# Patient Record
Sex: Female | Born: 1940 | Hispanic: No | Smoking: Never smoker
Health system: Southern US, Community
[De-identification: ages and names within clinical notes are randomized; demographics above are authoritative.]

## PROBLEM LIST (undated history)

## (undated) DIAGNOSIS — G4730 Sleep apnea, unspecified: Secondary | ICD-10-CM

## (undated) DIAGNOSIS — I10 Essential (primary) hypertension: Secondary | ICD-10-CM

## (undated) DIAGNOSIS — M1990 Unspecified osteoarthritis, unspecified site: Secondary | ICD-10-CM

## (undated) DIAGNOSIS — I4891 Unspecified atrial fibrillation: Secondary | ICD-10-CM

## (undated) DIAGNOSIS — I509 Heart failure, unspecified: Secondary | ICD-10-CM

## (undated) HISTORY — PX: NO PAST SURGERIES: SHX2092

---

## 2009-11-13 ENCOUNTER — Emergency Department (HOSPITAL_COMMUNITY): Admission: EM | Admit: 2009-11-13 | Discharge: 2009-11-13 | Payer: Self-pay | Admitting: Emergency Medicine

## 2011-01-23 LAB — DIFFERENTIAL
Basophils Absolute: 0 10*3/uL (ref 0.0–0.1)
Basophils Relative: 1 % (ref 0–1)
Eosinophils Absolute: 0.2 10*3/uL (ref 0.0–0.7)
Eosinophils Relative: 3 % (ref 0–5)
Neutro Abs: 3 10*3/uL (ref 1.7–7.7)

## 2011-01-23 LAB — CBC
HCT: 39.5 % (ref 36.0–46.0)
MCHC: 33.1 g/dL (ref 30.0–36.0)
MCV: 89.2 fL (ref 78.0–100.0)
Platelets: 231 10*3/uL (ref 150–400)
RBC: 4.43 MIL/uL (ref 3.87–5.11)
RDW: 12.9 % (ref 11.5–15.5)

## 2011-01-23 LAB — COMPREHENSIVE METABOLIC PANEL
ALT: 13 U/L (ref 0–35)
Alkaline Phosphatase: 75 U/L (ref 39–117)
CO2: 28 mEq/L (ref 19–32)
Calcium: 8.9 mg/dL (ref 8.4–10.5)
Creatinine, Ser: 0.51 mg/dL (ref 0.4–1.2)
GFR calc Af Amer: >60 mL/min (ref >60)
Sodium: 141 mEq/L (ref 135–145)
Total Bilirubin: 0.7 mg/dL (ref 0.3–1.2)
Total Protein: 7.1 g/dL (ref 6.0–8.3)

## 2011-01-23 LAB — URINALYSIS, ROUTINE W REFLEX MICROSCOPIC
Ketones, ur: NEGATIVE mg/dL
Urobilinogen, UA: 0.2 mg/dL (ref 0.0–1.0)
pH: 6 (ref 5.0–8.0)

## 2011-01-23 LAB — URINE MICROSCOPIC-ADD ON

## 2012-03-04 ENCOUNTER — Emergency Department (HOSPITAL_COMMUNITY): Payer: Medicare Other

## 2012-03-04 ENCOUNTER — Encounter (HOSPITAL_COMMUNITY): Payer: Self-pay | Admitting: General Practice

## 2012-03-04 ENCOUNTER — Emergency Department (HOSPITAL_COMMUNITY)
Admission: EM | Admit: 2012-03-04 | Discharge: 2012-03-04 | Disposition: A | Discharge status: Home / Self Care | Payer: Medicare Other | Attending: Emergency Medicine | Admitting: Emergency Medicine

## 2012-03-04 DIAGNOSIS — Z8739 Personal history of other diseases of the musculoskeletal system and connective tissue: Secondary | ICD-10-CM | POA: Insufficient documentation

## 2012-03-04 DIAGNOSIS — M25579 Pain in unspecified ankle and joints of unspecified foot: Secondary | ICD-10-CM | POA: Insufficient documentation

## 2012-03-04 DIAGNOSIS — M7020 Olecranon bursitis, unspecified elbow: Secondary | ICD-10-CM | POA: Insufficient documentation

## 2012-03-04 DIAGNOSIS — M25572 Pain in left ankle and joints of left foot: Secondary | ICD-10-CM

## 2012-03-04 HISTORY — DX: Unspecified osteoarthritis, unspecified site: M19.90

## 2012-03-04 NOTE — ED Notes (Signed)
Denies injury- good CMS

## 2012-03-04 NOTE — ED Notes (Signed)
Pt refused Ace wrap application but took ace wrap home with her.

## 2012-03-04 NOTE — ED Notes (Signed)
No distress noted.  Pt verbalizes understnading

## 2012-03-04 NOTE — ED Provider Notes (Signed)
Medical screening examination/treatment/procedure(s) were performed by non-physician practitioner and as supervising physician I was immediately available for consultation/collaboration.   Lyanne Co, MD 03/04/12 [phone]

## 2012-03-04 NOTE — ED Notes (Signed)
Pt states that she woke up this morning and her left elbow and left ankle/foot were hurting. Family states that the swelling has increased throughout the day. Pt stated she took 1 50mg  tablet of Tramadol for the pain with pain relief. Pt states she has had the swelling and pain in her left leg before. Pt denies history of blood clots. Pt denies pain anywhere else. Pt is in no acute distress at this time.

## 2012-03-04 NOTE — ED Provider Notes (Signed)
History     CSN: [id number]  Arrival date & time 03/04/12  2042   First MD Initiated Contact with Patient 03/04/12 2132      Chief Complaint  Patient presents with  . Arm Pain  . Leg Pain    (Consider location/radiation/quality/duration/timing/severity/associated sxs/prior treatment) HPI Comments: Pt is a 71yo female who presents with multiple complaints. States she wants her left ankle to get looked at. States left ankle is painful and swollen. Symptoms started about a week ago. Denies any injuries. Denies redness, fever. Also today, started having left elbow pain. Denies hitting elbow, denies injuring it. Denies swelling or redness to the elbow. She admits to having arthirits, denies any other medical problems. Denies taking any medications. Denies fever, chills, malaise.   The history is provided by the patient.    Past Medical History  Diagnosis Date  . Arthritis     No past surgical history on file.  No family history on file.  History  Substance Use Topics  . Smoking status: Never Smoker   . Smokeless tobacco: Never Used  . Alcohol Use: No    OB History    Grav Para Term Preterm Abortions TAB SAB Ect Mult Living                  Review of Systems  Constitutional: Negative for fever, chills and unexpected weight change.  Respiratory: Negative.   Cardiovascular: Negative.   Gastrointestinal: Negative.   Musculoskeletal: Positive for joint swelling and arthralgias.  Skin: Negative.   Neurological: Negative for dizziness and weakness.    Allergies  Shrimp  Home Medications  No current outpatient prescriptions on file.  BP 173/95  Pulse 69  Temp(Src) 98 F (36.7 C) (Oral)  Resp 17  SpO2 100%  Physical Exam  Nursing note and vitals reviewed. Constitutional: She is oriented to person, place, and time. She appears well-developed and well-nourished. No distress.  HENT:  Head: Normocephalic.  Eyes: Conjunctivae are normal.  Neck: Neck supple.    Cardiovascular: Normal rate, regular rhythm and normal heart sounds.   Pulmonary/Chest: Effort normal and breath sounds normal. No respiratory distress. She has no wheezes.  Musculoskeletal:       Swelling noted to the left lateral ankle. Tender to palpation over lateral malleolus. Pain with foot dorsilfexion, plantarflexion, inversion and eversion. Tenderness over olecranon of the left elbow. No medial or lateral epicondyle tenderness. Full ROM Of the let elbow. No erythema or swelling noted  Neurological: She is alert and oriented to person, place, and time.  Skin: Skin is warm and dry.  Psychiatric: She has a normal mood and affect.    ED Course  Procedures (including critical care time)  Pt with left elbow pain, no injury, left ankle pain, no injury. Pt requesting x-rays.   Dg Elbow Complete Left  03/04/2012  *RADIOLOGY REPORT*  Clinical Data: Left elbow pain.  LEFT ELBOW - COMPLETE 3+ VIEW  Comparison: None.  Findings: No fracture or dislocation.  No joint effusion.  No aggressive osseous lesion.  IMPRESSION: No acute osseous abnormality identified.  Original Report Authenticated By: Waneta Martins, M.D.   Dg Ankle Complete Left  03/04/2012  *RADIOLOGY REPORT*  Clinical Data: Left ankle pain, no known injury.  LEFT ANKLE COMPLETE - 3+ VIEW  Comparison: None.  Findings: There is soft tissue swelling overlying the ankle, most pronounced over the medial malleolus.  Small calcific density along the inferior tip of the medial malleolus may reflect remote trauma.  There is mild irregularity to the medial talus which is also favored to be sequelae of prior injury. Suboptimal lateral radiograph due to positioning.  There may be a small joint effusion. Degenerative changes of the tibiotalar and talonavicular joints.  IMPRESSION: Soft tissue swelling, sequelae of prior trauma, and degenerative changes.  No definite acute osseous abnormality. If there remanis clinical concern for a fracture,  recommend a repeat radiograph in 7- 10 days to evaluate for interval change or callus formation.  Original Report Authenticated By: Waneta Martins, M.D.   11:11 PM X-rays negative. Suspect possible non infectious olecranon bursitis and osteoarthritis of left ankle. There are no signs of joint infection. Pt reassured. She was given an ACE wrap for her elbow, and will d/c home. Pt will follow up with her doctor in [place].    1. Olecranon bursitis   2. Left ankle pain       MDM          Lottie Mussel, PA 03/04/12 2313

## 2012-03-04 NOTE — Discharge Instructions (Signed)
Your x-ray of the elbow is normal. Your x-ray of the the left ankle shows a lot of arthritis. Keep your leg elevated as much as able, Ice it after being on your feet for too long. ACE wrap to the elbow. Take ibuprofen 400mg  every 6 hrs for pain and swelling. You can take your tramadol or tylenol as well. Follow up with your doctor or a physician here.   RESOURCE GUIDE  Dental Problems  Patients with Medicaid: [facility] [phone] [address]                                           [phone] [address] Phone:  [phone]                                                  Phone:  [phone]  If unable to pay or uninsured, contact:  [facility] or [facility]. to become qualified for the adult dental clinic.  Chronic Pain Problems Contact [facility]  [phone] Patients need to be referred by their primary care doctor.  Insufficient Money for Medicine Contact [employer]:  call "211" or [facility] [phone].  No Primary Care Doctor Call [facility]  [phone] Other agencies that provide inexpensive medical care    [facility]  [phone]    [facility]  [phone]    [facility]  [phone]    [facility]  [phone]    [facility]  [phone]    [facility]  [phone]  Psychological Services [facility] Behavior[facility]  [phone] [facility]  [phone] [facility] Mental Health   [phone] (emergency services [phone])  Substance Abuse Resources [facility]  [phone] [facility] [phone] [facility] [phone] Floydene Flock [phone] Residential & Outpatient Substance Abuse Program  [phone]  Abuse/Neglect [facility] Child Abuse Hotline 725-189-8719 [facility] Child Abuse Hotline [phone] (After Hours)  Emergency Shelter [facility] 8322473275  Maternity Homes [facility] [phone] [facility] [phone]  MRSA Hotline #:   [phone]    [facility] Resources  [facility]     [employer]                          [facility] Dept. [address] [place]                       [address]      [address]  Patrecia Pace  [facility] Phone:  [phone]                                   Phone:  [phone]                 Phone:  [place] Phone:  [place] [phone] (After Hours)

## 2013-04-22 ENCOUNTER — Encounter (HOSPITAL_COMMUNITY): Payer: Self-pay | Admitting: *Deleted

## 2013-04-22 ENCOUNTER — Inpatient Hospital Stay (HOSPITAL_COMMUNITY)
Admission: EM | Admit: 2013-04-22 | Discharge: 2013-04-25 | DRG: 308 | Disposition: A | Discharge status: Home / Self Care | Payer: Medicare Other | Attending: Cardiology | Admitting: Cardiology

## 2013-04-22 ENCOUNTER — Emergency Department (HOSPITAL_COMMUNITY): Payer: Medicare Other

## 2013-04-22 DIAGNOSIS — M1990 Unspecified osteoarthritis, unspecified site: Secondary | ICD-10-CM | POA: Diagnosis present

## 2013-04-22 DIAGNOSIS — I10 Essential (primary) hypertension: Secondary | ICD-10-CM | POA: Diagnosis present

## 2013-04-22 DIAGNOSIS — I5041 Acute combined systolic (congestive) and diastolic (congestive) heart failure: Secondary | ICD-10-CM

## 2013-04-22 DIAGNOSIS — I4891 Unspecified atrial fibrillation: Principal | ICD-10-CM

## 2013-04-22 DIAGNOSIS — I509 Heart failure, unspecified: Secondary | ICD-10-CM | POA: Diagnosis present

## 2013-04-22 DIAGNOSIS — E876 Hypokalemia: Secondary | ICD-10-CM | POA: Diagnosis present

## 2013-04-22 HISTORY — DX: Essential (primary) hypertension: I10

## 2013-04-22 LAB — CBC
MCV: 90.2 fL (ref 78.0–100.0)
RBC: 4.5 MIL/uL (ref 3.87–5.11)
RDW: 13.2 % (ref 11.5–15.5)
WBC: 7.3 10*3/uL (ref 4.0–10.5)

## 2013-04-22 LAB — BASIC METABOLIC PANEL
BUN: 16 mg/dL (ref 6–23)
CO2: 23 mEq/L (ref 19–32)
Calcium: 8.8 mg/dL (ref 8.4–10.5)
Chloride: 108 mEq/L (ref 96–112)
Creatinine, Ser: 0.69 mg/dL (ref 0.50–1.10)
GFR calc Af Amer: >90 mL/min (ref >90)
GFR calc non Af Amer: 86 mL/min — ABNORMAL LOW (ref >90)
Glucose, Bld: 108 mg/dL — ABNORMAL HIGH (ref 70–99)
Potassium: 3.8 mEq/L (ref 3.5–5.1)

## 2013-04-22 LAB — PROTIME-INR: Prothrombin Time: 13.7 seconds (ref 11.6–15.2)

## 2013-04-22 MED ORDER — DEXTROSE 5 % IV SOLN: 60.0000 mg/h | Freq: Once | INTRAVENOUS | Status: DC

## 2013-04-22 MED ORDER — AMIODARONE HCL IN DEXTROSE 360-4.14 MG/200ML-% IV SOLN
60.0000 mg/h | INTRAVENOUS | Status: AC
  Administered 2013-04-23: 60 mg/h via INTRAVENOUS
  Filled 2013-04-22: qty 200

## 2013-04-22 MED ORDER — AMIODARONE HCL IN DEXTROSE 360-4.14 MG/200ML-% IV SOLN
30.0000 mg/h | INTRAVENOUS | Status: DC
  Administered 2013-04-23: 30 mg/h via INTRAVENOUS
  Filled 2013-04-22 (×3): qty 200

## 2013-04-22 MED ORDER — AMIODARONE IV BOLUS ONLY 150 MG/100ML
150.0000 mg | Freq: Once | INTRAVENOUS | Status: AC
  Administered 2013-04-23: 150 mg via INTRAVENOUS
  Filled 2013-04-22: qty 100

## 2013-04-22 MED ORDER — FUROSEMIDE 10 MG/ML IJ SOLN
40.0000 mg | Freq: Once | INTRAMUSCULAR | Status: AC
  Administered 2013-04-22: 40 mg via INTRAVENOUS
  Filled 2013-04-22: qty 4

## 2013-04-22 NOTE — ED Notes (Signed)
Pt states that she woke up around 200am feeling short of breath and that her heart was racing;  The symptoms have come and gone all day; pt states that she feels weak and tired since the symptoms began; NP cough; pt also c/o left lower rib area pain

## 2013-04-22 NOTE — ED Provider Notes (Signed)
History     CSN: [id number]  Arrival date & time 04/22/13  1939   First MD Initiated Contact with Patient 04/22/13 2053      Chief Complaint  Patient presents with  . Shortness of Breath  . Palpitations    (Consider location/radiation/quality/duration/timing/severity/associated sxs/prior treatment) HPI This 72 year old female moved here from [place] several months ago does not have a doctor here yet, she has no history of coronary artery disease or atrial fibrillation, she is no history of heart failure in the past, she states that she woke up at 2:00 this morning with palpitations with intermittent racing heartbeat and feeling of mild shortness of breath slight nonproductive cough and noticed edema to her lower legs and feet today, she has had no fever no chest pain no abdominal pain no lightheadedness no vomiting no bloody stools no focal neurologic symptoms and no treatment prior to arrival. Past Medical History  Diagnosis Date  . Arthritis   . Hypertension     History reviewed. No pertinent past surgical history.  No family history on file.  History  Substance Use Topics  . Smoking status: Never Smoker   . Smokeless tobacco: Never Used  . Alcohol Use: No    OB History   Grav Para Term Preterm Abortions TAB SAB Ect Mult Living                  Review of Systems 10 Systems reviewed and are negative for acute change except as noted in the HPI. Allergies  Shrimp  Home Medications   No current outpatient prescriptions on file.  BP 161/98  Pulse 87  Temp(Src) 97.6 F (36.4 C) (Oral)  Resp 20  Ht 5\' 5"  (1.651 m)  Wt 175 lb 7.8 oz (79.6 kg)  BMI 29.2 kg/m2  SpO2 99%  Physical Exam  Nursing note and vitals reviewed. Constitutional:  Awake, alert, nontoxic appearance.  HENT:  Head: Atraumatic.  Eyes: Right eye exhibits no discharge. Left eye exhibits no discharge.  Neck: Neck supple.  Cardiovascular:  No murmur heard. Irregularly irregular rate and  rhythm rate fluctuates approximately between 100-120bpm  Pulmonary/Chest: Effort normal. No respiratory distress. She has no wheezes. She has rales. She exhibits no tenderness.  Bibasilar crackles no wheezes no retractions no accessory muscle usage speech is normal with full sentences with pulse oximetry normal on RA at 97%  Abdominal: Soft. There is no tenderness. There is no rebound.  Musculoskeletal: She exhibits edema. She exhibits no tenderness.  Baseline ROM, no obvious new focal weakness. Mild edema to both lower legs without cellulitis.  Neurological:  Mental status and motor strength appears baseline for patient and situation.  Skin: No rash noted.  Psychiatric: She has a normal mood and affect.    ED Course  Procedures (including critical care time) ECG: Atrial fibrillation ventricular rate 103, normal axis, nonspecific T wave abnormality, no comparison ECG available   Pt feels improved after observation and/or treatment in ED. Patient voided 4 times in the ED and feels much improved. Patient initially wanted to leave AGAINST MEDICAL ADVICE but because she has no doctor no cardiologist has never had atrial fibrillation heart failure before the family and I have convinced her to stay in the hospital at least tonight which is now willing to do and cardiology is paged to consider admission. 2335  Harwani will see in ED for admit; recs heparin and amiodarone. 2340  Labs Reviewed  BASIC METABOLIC PANEL - Abnormal; Notable for the following:  Glucose, Bld 108 (*)    GFR calc non Af Amer 86 (*)    All other components within normal limits  PRO B NATRIURETIC PEPTIDE - Abnormal; Notable for the following:    Pro B Natriuretic peptide (BNP) 1148.0 (*)    All other components within normal limits  CBC  PROTIME-INR  HEPARIN LEVEL (UNFRACTIONATED)  CBC  TROPONIN I  TROPONIN I  TROPONIN I  PROTIME-INR  APTT  CBC WITH DIFFERENTIAL  TSH  COMPREHENSIVE METABOLIC PANEL  MAGNESIUM   PRO B NATRIURETIC PEPTIDE  HEMOGLOBIN A1C  LIPID PANEL  BASIC METABOLIC PANEL  POCT I-STAT TROPONIN I   Dg Chest 2 View  04/22/2013   *RADIOLOGY REPORT*  Clinical Data: Shortness of breath.  Palpitations.  CHEST - 2 VIEW  Comparison: Chest x-ray 11/13/2009.  Findings: There is cephalization of the pulmonary vasculature and slight indistinctness of the interstitial markings suggestive of mild pulmonary edema.  Mild cardiomegaly.  No acute consolidative airspace disease.  No pleural effusions.  Upper mediastinal contours are within normal limits.  Atherosclerosis in the thoracic aorta.  IMPRESSION: Mild cardiomegaly with mild interstitial pulmonary edema, suggestive of mild congestive heart failure.   Original Report Authenticated By: Trudie Reed, M.D.     1. Atrial fibrillation, new onset   2. Congestive heart failure, acute, combined       MDM  The patient appears reasonably stabilized for admission considering the current resources, flow, and capabilities available in the ED at this time, and I doubt any other [facility] requiring further screening and/or treatment in the ED prior to admission.        Hurman Horn, MD 04/23/13 [phone]

## 2013-04-22 NOTE — H&P (Signed)
Chelsea [name] is an 72 y.o. female.   Chief Complaint: Palpitation associated with shortness of breath HPI: Patient is 72 year old female with past medical history significant for hypertension not on any medication, history of arthritis, came to the ER complaining of amputation of and on since yesterday 2 AM associated with mild shortness of breath and leg swelling associated with feeling tired weak and fatigued. States she went to her daughter's house and she brought her to 67. Patient denies any chest pain nausea or vomiting diaphoresis. Denies prior history of palpitations. Denies any lightheadedness or syncope. Denies history of PND orthopnea. Patient states she walks 20 minutes daily without any problems. Denies any recent weight loss or gain. In ED patient was found to be in A. fib with moderate ventricular response and mild decompensated heart failure. Patient denies any history of rheumatic fever in the past.  Past Medical History  Diagnosis Date  . Arthritis   . Hypertension     History reviewed. No pertinent past surgical history.  No family history on file. Social History:  reports that she has never smoked. She has never used smokeless tobacco. She reports that she does not drink alcohol or use illicit drugs.  Allergies:  Allergies  Allergen Reactions  . Shrimp (Shellfish Allergy)     hives     (Not in a hospital admission)  Results for orders placed during the hospital encounter of 04/22/13 (from the past 48 hour(s))  CBC     Status: None   Collection Time    04/22/13  9:10 PM      Result Value Range   WBC 7.3  4.0 - 10.5 K/uL   RBC 4.50  3.87 - 5.11 MIL/uL   Hemoglobin 12.7  12.0 - 15.0 g/dL   HCT 96.0  45.4 - 09.8 %   MCV 90.2  78.0 - 100.0 fL   MCH 28.2  26.0 - 34.0 pg   MCHC 31.3  30.0 - 36.0 g/dL   RDW 11.9  14.7 - 82.9 %   Platelets 211  150 - 400 K/uL  BASIC METABOLIC PANEL     Status: Abnormal   Collection Time    04/22/13  9:10 PM      Result Value Range    Sodium 141  135 - 145 mEq/L   Potassium 3.8  3.5 - 5.1 mEq/L   Chloride 108  96 - 112 mEq/L   CO2 23  19 - 32 mEq/L   Glucose, Bld 108 (*) 70 - 99 mg/dL   BUN 16  6 - 23 mg/dL   Creatinine, Ser 5.62  0.50 - 1.10 mg/dL   Calcium 8.8  8.4 - 13.0 mg/dL   GFR calc non Af Amer 86 (*) >90 mL/min   GFR calc Af Amer >90  >90 mL/min   Comment:            The eGFR has been calculated     using the CKD EPI equation.     This calculation has not been     validated in all clinical     situations.     eGFR's persistently     <90 mL/min signify     possible Chronic Kidney Disease.  PRO B NATRIURETIC PEPTIDE     Status: Abnormal   Collection Time    04/22/13  9:10 PM      Result Value Range   Pro B Natriuretic peptide (BNP) 1148.0 (*) 0 - 125 pg/mL  PROTIME-INR  Status: None   Collection Time    04/22/13  9:10 PM      Result Value Range   Prothrombin Time 13.7  11.6 - 15.2 seconds   INR 1.06  0.00 - 1.49  POCT I-STAT TROPONIN I     Status: None   Collection Time    04/22/13  9:16 PM      Result Value Range   Troponin i, poc 0.01  0.00 - 0.08 ng/mL   Comment 3            Comment: Due to the release kinetics of cTnI,     a negative result within the first hours     of the onset of symptoms does not rule out     myocardial infarction with certainty.     If myocardial infarction is still suspected,     repeat the test at appropriate intervals.   Dg Chest 2 View  04/22/2013   *RADIOLOGY REPORT*  Clinical Data: Shortness of breath.  Palpitations.  CHEST - 2 VIEW  Comparison: Chest x-ray 11/13/2009.  Findings: There is cephalization of the pulmonary vasculature and slight indistinctness of the interstitial markings suggestive of mild pulmonary edema.  Mild cardiomegaly.  No acute consolidative airspace disease.  No pleural effusions.  Upper mediastinal contours are within normal limits.  Atherosclerosis in the thoracic aorta.  IMPRESSION: Mild cardiomegaly with mild interstitial pulmonary  edema, suggestive of mild congestive heart failure.   Original Report Authenticated By: Trudie Reed, M.D.    Review of Systems  Constitutional: Negative for fever, chills, weight loss and diaphoresis.  HENT: Negative for hearing loss.   Eyes: Negative for blurred vision and double vision.  Respiratory: Positive for shortness of breath. Negative for cough, hemoptysis and sputum production.   Cardiovascular: Positive for palpitations and leg swelling. Negative for chest pain, orthopnea and claudication.  Gastrointestinal: Negative for nausea and vomiting.  Neurological: Positive for weakness. Negative for dizziness and headaches.    Blood pressure 135/83, pulse 77, temperature 98.4 F (36.9 C), temperature source Oral, resp. rate 23, height 5\' 5"  (1.651 m), weight 83.462 kg (184 lb), SpO2 99.00%. Physical Exam  Constitutional: She is oriented to person, place, and time. She appears well-developed and well-nourished.  HENT:  Head: Normocephalic and atraumatic.  Eyes: Conjunctivae and EOM are normal. Pupils are equal, round, and reactive to light.  Neck: Normal range of motion. Neck supple. JVD present. No thyromegaly present.  Cardiovascular:  Irregularly irregular S1-S2 soft soft systolic murmur and S3 gallop noted   Respiratory:  Decreased breath sound at bases with bibasilar rales  GI: Soft. She exhibits no distension. There is no tenderness. There is no rebound and no guarding.  Musculoskeletal:  No clubbing cyanosis 1+ edema noted more in the right leg as compared to left leg  Neurological: She is alert and oriented to person, place, and time.     Assessment/Plan New-onset A. 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

## 2013-04-23 LAB — COMPREHENSIVE METABOLIC PANEL
ALT: 36 U/L — ABNORMAL HIGH (ref 0–35)
Albumin: 3.4 g/dL — ABNORMAL LOW (ref 3.5–5.2)
Alkaline Phosphatase: 65 U/L (ref 39–117)
BUN: 13 mg/dL (ref 6–23)
Chloride: 105 mEq/L (ref 96–112)
GFR calc Af Amer: >90 mL/min (ref >90)
Glucose, Bld: 114 mg/dL — ABNORMAL HIGH (ref 70–99)
Potassium: 3 mEq/L — ABNORMAL LOW (ref 3.5–5.1)
Sodium: 142 mEq/L (ref 135–145)
Total Bilirubin: 0.3 mg/dL (ref 0.3–1.2)

## 2013-04-23 LAB — CBC WITH DIFFERENTIAL/PLATELET
HCT: 40 % (ref 36.0–46.0)
Hemoglobin: 12.7 g/dL (ref 12.0–15.0)
Lymphocytes Relative: 36 % (ref 12–46)
Monocytes Absolute: 0.6 10*3/uL (ref 0.1–1.0)
Monocytes Relative: 8 % (ref 3–12)
Neutro Abs: 3.8 10*3/uL (ref 1.7–7.7)
RBC: 4.43 MIL/uL (ref 3.87–5.11)
WBC: 7 10*3/uL (ref 4.0–10.5)

## 2013-04-23 LAB — LIPID PANEL
Cholesterol: 199 mg/dL (ref 0–200)
HDL: 50 mg/dL (ref >39)
Total CHOL/HDL Ratio: 4 RATIO
VLDL: 17 mg/dL (ref 0–40)

## 2013-04-23 LAB — MAGNESIUM: Magnesium: 2.1 mg/dL (ref 1.5–2.5)

## 2013-04-23 LAB — APTT: aPTT: 69 seconds — ABNORMAL HIGH (ref 24–37)

## 2013-04-23 MED ORDER — NITROGLYCERIN 0.4 MG SL SUBL: 0.4000 mg | SUBLINGUAL_TABLET | SUBLINGUAL | Status: DC | PRN

## 2013-04-23 MED ORDER — SODIUM CHLORIDE 0.9 % IV SOLN: INTRAVENOUS | Status: DC

## 2013-04-23 MED ORDER — ACETAMINOPHEN 325 MG PO TABS
650.0000 mg | ORAL_TABLET | ORAL | Status: DC | PRN
  Administered 2013-04-24: 650 mg via ORAL
  Filled 2013-04-23: qty 2

## 2013-04-23 MED ORDER — ASPIRIN 300 MG RE SUPP
300.0000 mg | RECTAL | Status: AC
  Filled 2013-04-23: qty 1

## 2013-04-23 MED ORDER — AMIODARONE HCL 200 MG PO TABS
400.0000 mg | ORAL_TABLET | Freq: Two times a day (BID) | ORAL | Status: DC
  Administered 2013-04-23 – 2013-04-24 (×3): 400 mg via ORAL
  Filled 2013-04-23 (×4): qty 2

## 2013-04-23 MED ORDER — HEPARIN BOLUS VIA INFUSION
3000.0000 [IU] | Freq: Once | INTRAVENOUS | Status: AC
  Administered 2013-04-23: 3000 [IU] via INTRAVENOUS

## 2013-04-23 MED ORDER — ONDANSETRON HCL 4 MG/2ML IJ SOLN: 4.0000 mg | Freq: Four times a day (QID) | INTRAMUSCULAR | Status: DC | PRN

## 2013-04-23 MED ORDER — POTASSIUM CHLORIDE CRYS ER 20 MEQ PO TBCR
40.0000 meq | EXTENDED_RELEASE_TABLET | Freq: Once | ORAL | Status: AC
  Administered 2013-04-23: 40 meq via ORAL
  Filled 2013-04-23: qty 2

## 2013-04-23 MED ORDER — HEPARIN (PORCINE) IN NACL 100-0.45 UNIT/ML-% IJ SOLN
1000.0000 [IU]/h | INTRAMUSCULAR | Status: DC
  Administered 2013-04-23 – 2013-04-24 (×3): 1000 [IU]/h via INTRAVENOUS
  Filled 2013-04-23 (×4): qty 250

## 2013-04-23 MED ORDER — POTASSIUM CHLORIDE CRYS ER 20 MEQ PO TBCR
20.0000 meq | EXTENDED_RELEASE_TABLET | Freq: Two times a day (BID) | ORAL | Status: DC
  Administered 2013-04-23 – 2013-04-25 (×5): 20 meq via ORAL
  Filled 2013-04-23 (×6): qty 1

## 2013-04-23 MED ORDER — FUROSEMIDE 10 MG/ML IJ SOLN
40.0000 mg | Freq: Every day | INTRAMUSCULAR | Status: DC
  Administered 2013-04-23 – 2013-04-25 (×3): 40 mg via INTRAVENOUS
  Filled 2013-04-23 (×3): qty 4

## 2013-04-23 MED ORDER — ASPIRIN EC 81 MG PO TBEC
81.0000 mg | DELAYED_RELEASE_TABLET | Freq: Every day | ORAL | Status: DC
  Administered 2013-04-24 – 2013-04-25 (×2): 81 mg via ORAL
  Filled 2013-04-23 (×2): qty 1

## 2013-04-23 MED ORDER — POTASSIUM CHLORIDE CRYS ER 20 MEQ PO TBCR
20.0000 meq | EXTENDED_RELEASE_TABLET | Freq: Every day | ORAL | Status: DC
  Filled 2013-04-23: qty 1

## 2013-04-23 MED ORDER — ASPIRIN 81 MG PO CHEW
324.0000 mg | CHEWABLE_TABLET | ORAL | Status: AC
  Filled 2013-04-23: qty 4

## 2013-04-23 MED ORDER — VITAMIN D (ERGOCALCIFEROL) 1.25 MG (50000 UNIT) PO CAPS: 50000.0000 [IU] | ORAL_CAPSULE | ORAL | Status: DC

## 2013-04-23 MED ORDER — METOPROLOL TARTRATE 12.5 MG HALF TABLET
12.5000 mg | ORAL_TABLET | Freq: Two times a day (BID) | ORAL | Status: DC
  Administered 2013-04-23 – 2013-04-24 (×3): 12.5 mg via ORAL
  Filled 2013-04-23 (×5): qty 1

## 2013-04-23 NOTE — Progress Notes (Signed)
ANTICOAGULATION CONSULT NOTE - Initial Consult  Pharmacy Consult for Heparin Indication: atrial fibrillation  Allergies  Allergen Reactions  . Shrimp (Shellfish Allergy)     hives    Patient Measurements: Height: 5\' 5"  (165.1 cm) Weight: 184 lb (83.462 kg) IBW/kg (Calculated) : 57 Heparin Dosing Weight:   Vital Signs: Temp: 98.4 F (36.9 C) (06/16 2035) Temp src: Oral (06/16 2035) BP: 135/83 mmHg (06/16 2200) Pulse Rate: 77 (06/16 2200)  Labs:  Recent Labs  04/22/13 2110  HGB 12.7  HCT 40.6  PLT 211  LABPROT 13.7  INR 1.06  CREATININE 0.69    Estimated Creatinine Clearance: 69.8 ml/min (by C-G formula based on Cr of 0.69).   Medical History: Past Medical History  Diagnosis Date  . Arthritis   . Hypertension     Medications:  Infusions:  . amiodarone (NEXTERONE PREMIX) 360 mg/200 mL dextrose 60 mg/hr (04/23/13 0004)  . amiodarone (NEXTERONE PREMIX) 360 mg/200 mL dextrose    . heparin 1,000 Units/hr (04/23/13 0036)    Assessment: Patient with afib.   Goal of Therapy:  Heparin level 0.3-0.7 units/ml Monitor platelets by anticoagulation protocol: Yes   Plan:  Heparin bolus 3000 units iv x1 Heparin drip 1000 units/hr Heparin level and cbc daily, next level at 0800  Darlina Guys, [place] 04/23/2013,1:42 AM

## 2013-04-23 NOTE — Progress Notes (Signed)
  Echocardiogram 2D Echocardiogram has been performed.  Chelsea [name] 04/23/2013, 10:31 AM 

## 2013-04-23 NOTE — Progress Notes (Signed)
ANTICOAGULATION CONSULT NOTE - Follow Up Consult  Pharmacy Consult for IV heparin Indication: atrial fibrillation  Allergies  Allergen Reactions  . Shrimp (Shellfish Allergy)     hives    Patient Measurements: Height: 5\' 5"  (165.1 cm) Weight: 175 lb 7.8 oz (79.6 kg) IBW/kg (Calculated) : 57  Vital Signs: Temp: 97.5 F (36.4 C) (06/17 0526) Temp src: Oral (06/17 0526) BP: 147/97 mmHg (06/17 0526) Pulse Rate: 91 (06/17 0526)  Labs:  Recent Labs  04/22/13 2110 04/23/13 0445 04/23/13 0810  HGB 12.7 12.7  --   HCT 40.6 40.0  --   PLT 211 201  --   APTT  --  69*  --   LABPROT 13.7 14.2  --   INR 1.06 1.11  --   HEPARINUNFRC  --   --  0.40  CREATININE 0.69 0.65  --   TROPONINI  --  <0.30 <0.30    Estimated Creatinine Clearance: 68.2 ml/min (by C-G formula based on Cr of 0.65).   Assessment: 71 yof presented 6/16 with SOB and palpitations - found with new onset atrial fibrillation and mild CHF. Heparin ordered, CHADS2 score = 2 (new CHF, h/o HTN). CHA2DS2-VaSc score = 4.   IV heparin currently running at 1000 units/hr and first heparin level therapeutic at 0.40. CBC wnl, no bleeding.  Pt continues on amiodarone gtt.  Cardiology on board.  Goal of Therapy:  Heparin level 0.3-0.7 units/ml Monitor platelets by anticoagulation protocol: Yes   Plan:   Continue IV heparin at 1000 units/hr  Confirmatory heparin level at 1600  Daily CBC and heparin level  Pharmacy will f/u  Geoffry Paradise, PharmD, BCPS Pager: [phone] 10:20 AM Pharmacy #: [phone]

## 2013-04-23 NOTE — Progress Notes (Signed)
Subjective:  Patient denies any chest pain states breathing is improved remains in atrial fibrillation  Objective:  Vital Signs in the last 24 hours: Temp:  [97.5 F (36.4 C)-98.4 F (36.9 C)] 97.5 F (36.4 C) (06/17 0526) Pulse Rate:  [70-106] 91 (06/17 0526) Resp:  [18-23] 20 (06/17 0526) BP: (135-161)/(83-103) 147/97 mmHg (06/17 0526) SpO2:  [97 %-100 %] 99 % (06/17 0526) Weight:  [79.6 kg (175 lb 7.8 oz)-83.462 kg (184 lb)] 79.6 kg (175 lb 7.8 oz) (06/17 0314)  Intake/Output from previous day: 06/16 0701 - 06/17 0700 In: -  Out: 150 [Urine:150] Intake/Output from this shift: Total I/O In: -  Out: 400 [Urine:400]  Physical Exam: Neck: no adenopathy, no carotid bruit, no JVD and supple, symmetrical, trachea midline Lungs: Decreased breath sound at bases with bibasilar rales Heart: irregularly irregular rhythm, S1, S2 normal and Soft systolic murmur and S3 gallop noted Abdomen: soft, non-tender; bowel sounds normal; no masses,  no organomegaly Extremities: extremities normal, atraumatic, no cyanosis or edema  Lab Results:  Recent Labs  04/22/13 2110 04/23/13 0445  WBC 7.3 7.0  HGB 12.7 12.7  PLT 211 201    Recent Labs  04/22/13 2110 04/23/13 0445  NA 141 142  K 3.8 3.0*  CL 108 105  CO2 23 27  GLUCOSE 108* 114*  BUN 16 13  CREATININE 0.69 0.65    Recent Labs  04/23/13 0445 04/23/13 0810  TROPONINI <0.30 <0.30   Hepatic Function Panel  Recent Labs  04/23/13 0445  PROT 6.5  ALBUMIN 3.4*  AST 26  ALT 36*  ALKPHOS 65  BILITOT 0.3    Recent Labs  04/23/13 0445  CHOL 199   No results found for this basename: PROTIME,  in the last 72 hours  Imaging: Imaging results have been reviewed and Dg Chest 2 View  04/22/2013   *RADIOLOGY REPORT*  Clinical Data: Shortness of breath.  Palpitations.  CHEST - 2 VIEW  Comparison: Chest x-ray 11/13/2009.  Findings: There is cephalization of the pulmonary vasculature and slight indistinctness of the  interstitial markings suggestive of mild pulmonary edema.  Mild cardiomegaly.  No acute consolidative airspace disease.  No pleural effusions.  Upper mediastinal contours are within normal limits.  Atherosclerosis in the thoracic aorta.  IMPRESSION: Mild cardiomegaly with mild interstitial pulmonary edema, suggestive of mild congestive heart failure.   Original Report Authenticated By: Trudie Reed, M.D.    Cardiac Studies:  Assessment/Plan:  New-onset A. 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 Change IV amiodarone to by mouth  LOS: 1 day    Chelsea [name] N 04/23/2013, 12:48 PM

## 2013-04-23 NOTE — Progress Notes (Signed)
ANTICOAGULATION CONSULT NOTE - Follow Up Consult  Pharmacy Consult for IV heparin Indication: atrial fibrillation  Allergies  Allergen Reactions  . Shrimp (Shellfish Allergy)     hives    Patient Measurements: Height: 5\' 5"  (165.1 cm) Weight: 175 lb 7.8 oz (79.6 kg) IBW/kg (Calculated) : 57  Vital Signs: Temp: 97.9 F (36.6 C) (06/17 1426) Temp src: Oral (06/17 1426) BP: 138/80 mmHg (06/17 1426) Pulse Rate: 93 (06/17 1426)  Labs:  Recent Labs  04/22/13 2110 04/23/13 0445 04/23/13 0810 04/23/13 1547  HGB 12.7 12.7  --   --   HCT 40.6 40.0  --   --   PLT 211 201  --   --   APTT  --  69*  --   --   LABPROT 13.7 14.2  --   --   INR 1.06 1.11  --   --   HEPARINUNFRC  --   --  0.40 0.37  CREATININE 0.69 0.65  --   --   TROPONINI  --  <0.30 <0.30 <0.30    Estimated Creatinine Clearance: 68.2 ml/min (by C-G formula based on Cr of 0.65).   Assessment: 47 yof presented 6/16 with SOB and palpitations - found with new onset atrial fibrillation and mild CHF. Heparin ordered, CHADS2 score = 2 (new CHF, h/o HTN). CHA2DS2-VaSc score = 4.   IV heparin currently running at 1000 units/hr and first two heparin level are therapeutic.   CBC wnl, no bleeding reported.    Goal of Therapy:  Heparin level 0.3-0.7 units/ml Monitor platelets by anticoagulation protocol: Yes   Plan:   Continue IV heparin at 1000 units/hr  Daily CBC and heparin level  Pharmacy will f/u  Loralee Pacas, PharmD, BCPS Pharmacy #: [phone]

## 2013-04-24 LAB — BASIC METABOLIC PANEL
BUN: 18 mg/dL (ref 6–23)
Calcium: 9.1 mg/dL (ref 8.4–10.5)
Creatinine, Ser: 0.78 mg/dL (ref 0.50–1.10)
GFR calc Af Amer: >90 mL/min (ref >90)
GFR calc non Af Amer: 83 mL/min — ABNORMAL LOW (ref >90)
Glucose, Bld: 109 mg/dL — ABNORMAL HIGH (ref 70–99)
Potassium: 4 mEq/L (ref 3.5–5.1)

## 2013-04-24 LAB — CBC
HCT: 42.5 % (ref 36.0–46.0)
Hemoglobin: 13.2 g/dL (ref 12.0–15.0)
MCH: 28.3 pg (ref 26.0–34.0)
MCHC: 31.1 g/dL (ref 30.0–36.0)
RDW: 13.4 % (ref 11.5–15.5)

## 2013-04-24 LAB — HEPARIN LEVEL (UNFRACTIONATED): Heparin Unfractionated: 0.52 IU/mL (ref 0.30–0.70)

## 2013-04-24 MED ORDER — METOPROLOL TARTRATE 25 MG PO TABS
25.0000 mg | ORAL_TABLET | Freq: Two times a day (BID) | ORAL | Status: DC
  Administered 2013-04-24 – 2013-04-25 (×2): 25 mg via ORAL
  Filled 2013-04-24 (×3): qty 1

## 2013-04-24 MED ORDER — AMIODARONE HCL 200 MG PO TABS
200.0000 mg | ORAL_TABLET | Freq: Two times a day (BID) | ORAL | Status: DC
  Administered 2013-04-24 – 2013-04-25 (×2): 200 mg via ORAL
  Filled 2013-04-24 (×3): qty 1

## 2013-04-24 MED ORDER — APIXABAN 5 MG PO TABS
5.0000 mg | ORAL_TABLET | Freq: Two times a day (BID) | ORAL | Status: DC
  Administered 2013-04-24 – 2013-04-25 (×2): 5 mg via ORAL
  Filled 2013-04-24 (×3): qty 1

## 2013-04-24 NOTE — Progress Notes (Addendum)
ANTICOAGULATION CONSULT NOTE - Follow Up Consult  Pharmacy Consult for IV heparin Indication: atrial fibrillation  Allergies  Allergen Reactions  . Shrimp (Shellfish Allergy)     hives    Patient Measurements: Height: 5\' 5"  (165.1 cm) Weight: 176 lb 12.9 oz (80.2 kg) IBW/kg (Calculated) : 57  Vital Signs: Temp: 97.8 F (36.6 C) (06/18 0540) Temp src: Oral (06/18 0540) BP: 127/77 mmHg (06/18 0540) Pulse Rate: 85 (06/18 0540)  Labs:  Recent Labs  04/22/13 2110 04/23/13 0445 04/23/13 0810 04/23/13 1547 04/24/13 0519  HGB 12.7 12.7  --   --  13.2  HCT 40.6 40.0  --   --  42.5  PLT 211 201  --   --  194  APTT  --  69*  --   --   --   LABPROT 13.7 14.2  --   --   --   INR 1.06 1.11  --   --   --   HEPARINUNFRC  --   --  0.40 0.37 0.52  CREATININE 0.69 0.65  --   --  0.78  TROPONINI  --  <0.30 <0.30 <0.30  --     Estimated Creatinine Clearance: 68.5 ml/min (by C-G formula based on Cr of 0.78).   Assessment: 42 yof presented 6/16 with SOB and palpitations - found with new onset atrial fibrillation and mild CHF. Heparin ordered, CHADS2 score = 2 (new CHF, h/o HTN). CHA2DS2-VaSc score = 4.   IV heparin currently running at 1000 units/hr and heparin levels has been therapeutic. CBC wnl, no bleeding.  Pt continues on amiodarone (now PO).   Goal of Therapy:  Heparin level 0.3-0.7 units/ml Monitor platelets by anticoagulation protocol: Yes   Plan:   Continue IV heparin at 1000 units/hr  Confirmatory heparin level at 1600  Daily CBC and heparin level  MD, what is the plan for long-term anticoagulation in this patient?  Geoffry Paradise, PharmD, BCPS Pager: [phone] 10:34 AM Pharmacy #: [phone]

## 2013-04-24 NOTE — Progress Notes (Signed)
Subjective:  Patient denies any chest pain or shortness of breath states feels much better leg swelling is improved eager to go home. Remains in atrial fib with moderate ventricular response  Objective:  Vital Signs in the last 24 hours: Temp:  [97.8 F (36.6 C)-98.2 F (36.8 C)] 97.9 F (36.6 C) (06/18 1502) Pulse Rate:  [80-99] 95 (06/18 1502) Resp:  [19-20] 20 (06/18 1502) BP: (119-127)/(72-89) 121/72 mmHg (06/18 1502) SpO2:  [97 %-99 %] 98 % (06/18 1502) Weight:  [80.2 kg (176 lb 12.9 oz)] 80.2 kg (176 lb 12.9 oz) (06/18 0540)  Intake/Output from previous day: 06/17 0701 - 06/18 0700 In: 1128 [P.O.:920; I.V.:208] Out: 2050 [Urine:2050] Intake/Output from this shift: Total I/O In: 450 [P.O.:360; I.V.:90] Out: 500 [Urine:500]  Physical Exam: Neck: no adenopathy, no carotid bruit, no JVD and supple, symmetrical, trachea midline Lungs: Decreased breath sound at bases with bibasilar rales air entry improved Heart: irregularly irregular rhythm, S1, S2 normal and Soft systolic murmur noted Abdomen: soft, non-tender; bowel sounds normal; no masses,  no organomegaly Extremities: extremities normal, atraumatic, no cyanosis or edema  Lab Results:  Recent Labs  04/23/13 0445 04/24/13 0519  WBC 7.0 5.8  HGB 12.7 13.2  PLT 201 194    Recent Labs  04/23/13 0445 04/24/13 0519  NA 142 141  K 3.0* 4.0  CL 105 103  CO2 27 28  GLUCOSE 114* 109*  BUN 13 18  CREATININE 0.65 0.78    Recent Labs  04/23/13 0810 04/23/13 1547  TROPONINI <0.30 <0.30   Hepatic Function Panel  Recent Labs  04/23/13 0445  PROT 6.5  ALBUMIN 3.4*  AST 26  ALT 36*  ALKPHOS 65  BILITOT 0.3    Recent Labs  04/23/13 0445  CHOL 199   No results found for this basename: PROTIME,  in the last 72 hours  Imaging: Imaging results have been reviewed and Dg Chest 2 View  04/22/2013   *RADIOLOGY REPORT*  Clinical Data: Shortness of breath.  Palpitations.  CHEST - 2 VIEW  Comparison: Chest  x-ray 11/13/2009.  Findings: There is cephalization of the pulmonary vasculature and slight indistinctness of the interstitial markings suggestive of mild pulmonary edema.  Mild cardiomegaly.  No acute consolidative airspace disease.  No pleural effusions.  Upper mediastinal contours are within normal limits.  Atherosclerosis in the thoracic aorta.  IMPRESSION: Mild cardiomegaly with mild interstitial pulmonary edema, suggestive of mild congestive heart failure.   Original Report Authenticated By: Trudie Reed, M.D.    Cardiac Studies:  Assessment/Plan:  New-onset A. 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

## 2013-04-24 NOTE — Progress Notes (Signed)
MEDICATION RELATED CONSULT NOTE - INITIAL   Pharmacy Consult for apixaban Indication: non-valvular Afib  A/P: Patient possesses none of criteria for reducing apixaban dosing (must possess 2 of the following: age>=80, weight <= 60kg, or SCr>=1.5). Start apixaban 5mg  BID beginning tonight. Monitor closely for signs and symptoms of bleeding   Hessie Knows, PharmD, BCPS Pager [phone] 04/24/2013 6:50 PM

## 2013-04-24 NOTE — Progress Notes (Signed)
CARE MANAGEMENT NOTE 04/24/2013  Patient:  Chelsea [name], Chelsea [name]   Account Number:  [id number]  Date Initiated:  04/24/2013  Documentation initiated by:  DAVIS,RHONDA  Subjective/Objective Assessment:   pt with a.fib started on iv cardizem being switched to po aminodarone.     Action/Plan:   home when stable   Anticipated DC Date:  04/27/2013   Anticipated DC Plan:  HOME/SELF CARE  In-house referral  NA      DC Planning Services  NA      [facility] Choice  NA   Choice offered to / List presented to:  NA   DME arranged  NA      DME agency  NA     HH arranged  NA      HH agency  NA   Status of service:  In process, will continue to follow Medicare Important Message given?  NA - LOS <3 / Initial given by admissions (If response is "NO", the following Medicare IM given date fields will be blank) Date Medicare IM given:   Date Additional Medicare IM given:    Discharge Disposition:    Per UR Regulation:  Reviewed for med. necessity/level of care/duration of stay  If discussed at Long Length of Stay Meetings, dates discussed:    Comments:  [id number] Earlene Plater, RN, BSN, CCM: CHART REVIEWED AND UPDATED.  Next chart review due on [id number]. NO DISCHARGE NEEDS PRESENT AT THIS TIME. CASE MANAGEMENT [id number]

## 2013-04-25 LAB — CBC
HCT: 43 % (ref 36.0–46.0)
MCH: 28.3 pg (ref 26.0–34.0)
MCHC: 31.2 g/dL (ref 30.0–36.0)
MCV: 90.9 fL (ref 78.0–100.0)
Platelets: 211 10*3/uL (ref 150–400)
RDW: 13.2 % (ref 11.5–15.5)

## 2013-04-25 MED ORDER — ASPIRIN 81 MG PO TBEC: 81.0000 mg | DELAYED_RELEASE_TABLET | Freq: Every day | ORAL | Status: DC

## 2013-04-25 MED ORDER — AMIODARONE HCL 200 MG PO TABS: 200.0000 mg | ORAL_TABLET | Freq: Two times a day (BID) | ORAL | Status: DC

## 2013-04-25 MED ORDER — SPIRONOLACTONE 25 MG PO TABS: 25.0000 mg | ORAL_TABLET | Freq: Every day | ORAL | Status: DC

## 2013-04-25 MED ORDER — APIXABAN 5 MG PO TABS: 5.0000 mg | ORAL_TABLET | Freq: Two times a day (BID) | ORAL | Status: DC

## 2013-04-25 MED ORDER — METOPROLOL TARTRATE 25 MG PO TABS: 25.0000 mg | ORAL_TABLET | Freq: Two times a day (BID) | ORAL | Status: DC

## 2013-04-25 NOTE — Discharge Summary (Signed)
NAMEMARTISHA, Chelsea [name]                ACCOUNT NO.:  [id number]  MEDICAL RECORD NO.:  [id number]  LOCATION:  1424                         FACILITY:  [facility]  PHYSICIAN:  Henny Strauch N. Sharyn Lull, M.D. DATE OF BIRTH:  [birth year]  DATE OF ADMISSION:  04/22/2013 DATE OF DISCHARGE:  04/25/2013                              DISCHARGE SUMMARY   ADMITTING DIAGNOSES: 1. New-onset atrial fibrillation with moderate ventricular response 2. Mild decompensated congestive heart failure, probably secondary to     tachycardia induced. 3. Hypertension. 4. Degenerative joint disease.  FINAL DIAGNOSES: 1. New-onset atrial fibrillation. 2. Resolving mild decompensated congestive heart failure secondary to     preserved left ventricular systolic function, probably secondary to     tachycardia induced. 3. Hypertension. 4. Degenerative joint disease.  MEDICATIONS:  Discharge home medications are: 1. Amiodarone 200 mg twice daily. 2. Eliquis 5 mg twice daily. 3. Enteric-coated aspirin 81 mg daily. 4. Metoprolol tartrate 25 mg twice daily. 5. Aldactone 25 mg once daily. 6. Vitamin D 50,000 units every 7 days as before. 7. The patient has been advised to stop ibuprofen.  DIET:  Low salt, low cholesterol.  ACTIVITY:  Increase activity slowly as tolerated.  CONDITION AT DISCHARGE:  Stable.  BRIEF HISTORY AND HOSPITAL COURSE:  [name] is a 72 year old female with past medical history significant for hypertension, not on any medications, history of arthritis.  She came to the ER, complaining of palpitation off and on since yesterday at 2 a.m., associated with mild shortness of breath, leg swelling associated with feeling weak, tired, and fatigued.  She went to her daughter's house and she brought her to the ER.  The patient denies any chest pain, nausea, vomiting, diaphoresis.  Denies prior history of palpitation.  Denies any lightheadedness or syncope.  The patient denies history of PND, orthopnea.  States  she walks 20 minutes daily without any problems. Denies any recent weight gain or loss.  In the ED, the patient was found in AFib with moderate ventricular response and mild decompensated heart failure.  The patient denies any history of rheumatic fever in the past.  PAST MEDICAL HISTORY:  As above.  PHYSICAL EXAMINATION:  GENERAL:  She was alert, awake, and oriented x3. VITAL SIGNS:  Blood pressure was 135/83, pulse was 77.  Temperature, she was afebrile.  Conjunctivae pink. NECK:  Supple.  No JVD.  No bruit. LUNGS:  Decreased breath sounds at bases with bibasilar rales. CARDIOVASCULAR:  Irregularly irregular.  S1 and S2 was soft.  There was soft systolic murmur and S3 gallop. ABDOMEN:  Soft.  Bowel sounds were present.  Nontender. EXTREMITIES:  There was no clubbing, cyanosis.  There was 1+ edema noted, more in the right leg as compared to the left leg. NEUROLOGIC:  Grossly intact.  LABORATORY DATA:  Sodium was 141, potassium 3.8, BUN 16, creatinine 0.69.  Her pro-BNP was 1148 which is trending down, 978, today was 517. Three sets of cardiac enzymes were negative.  Cholesterol was 199, triglycerides 85, LDL was 132, HDL was 50.  Hemoglobin was 12.7, hematocrit 40.6, white count of 7.3 which has been stable.  Her TSH was in normal range 1.87.  IMAGING  STUDIES:  Chest x-ray showed mild cardiomegaly with mild interstitial pulmonary edema, suggestive of mild heart failure.  EKG showed atrial fibrillation with moderate ventricular response, nonspecific T-wave changes.  BRIEF HOSPITAL COURSE:  The patient was admitted to Telemetry Unit and was started on IV Lasix, IV heparin, and IV amiodarone with control of her heart rate.  IV heparin was discontinued and switched to p.o. Eliquis.  Amiodarone was switched to p.o.  The patient remained in atrial fibrillation during the hospital stay.  Beta-blockers were added. The patient's leg swelling gradually completely resolved. Lung sounds are  almost clear.  The patient is very eager to go home.  The patient is ambulating in room and hallway without any problems.  The patient will be discharged on home medications and will be followed up in my office in 1 week.     Eduardo Osier. Sharyn Lull, M.D.     MNH/MEDQ  D:  04/25/2013  T:  04/25/2013  Job:  [id number]

## 2013-04-25 NOTE — Progress Notes (Signed)
Patient received discharge instructions with family at bedside. All verbalized understanding without further questions. Patient prescriptions given and follow up appts discussed. Patient wants to ambulate downstairs to be discharged home.

## 2013-04-25 NOTE — Discharge Summary (Signed)
  Discharge summary dictated on 04/25/2013 dictation number is [phone]

## 2014-11-16 ENCOUNTER — Emergency Department (HOSPITAL_COMMUNITY): Payer: Medicaid Other

## 2014-11-16 ENCOUNTER — Emergency Department (HOSPITAL_COMMUNITY)
Admission: EM | Admit: 2014-11-16 | Discharge: 2014-11-16 | Disposition: A | Discharge status: Home / Self Care | Payer: Medicaid Other | Attending: Emergency Medicine | Admitting: Emergency Medicine

## 2014-11-16 ENCOUNTER — Encounter (HOSPITAL_COMMUNITY): Payer: Self-pay | Admitting: *Deleted

## 2014-11-16 DIAGNOSIS — R079 Chest pain, unspecified: Secondary | ICD-10-CM | POA: Insufficient documentation

## 2014-11-16 DIAGNOSIS — R000 Tachycardia, unspecified: Secondary | ICD-10-CM | POA: Diagnosis present

## 2014-11-16 DIAGNOSIS — Z7982 Long term (current) use of aspirin: Secondary | ICD-10-CM | POA: Insufficient documentation

## 2014-11-16 DIAGNOSIS — I10 Essential (primary) hypertension: Secondary | ICD-10-CM | POA: Insufficient documentation

## 2014-11-16 DIAGNOSIS — R059 Cough, unspecified: Secondary | ICD-10-CM

## 2014-11-16 DIAGNOSIS — Z7901 Long term (current) use of anticoagulants: Secondary | ICD-10-CM | POA: Insufficient documentation

## 2014-11-16 DIAGNOSIS — Z79899 Other long term (current) drug therapy: Secondary | ICD-10-CM | POA: Insufficient documentation

## 2014-11-16 DIAGNOSIS — J208 Acute bronchitis due to other specified organisms: Secondary | ICD-10-CM

## 2014-11-16 DIAGNOSIS — M1990 Unspecified osteoarthritis, unspecified site: Secondary | ICD-10-CM | POA: Insufficient documentation

## 2014-11-16 DIAGNOSIS — R05 Cough: Secondary | ICD-10-CM

## 2014-11-16 DIAGNOSIS — I4891 Unspecified atrial fibrillation: Secondary | ICD-10-CM | POA: Diagnosis not present

## 2014-11-16 DIAGNOSIS — J40 Bronchitis, not specified as acute or chronic: Secondary | ICD-10-CM | POA: Diagnosis not present

## 2014-11-16 LAB — COMPREHENSIVE METABOLIC PANEL
ALBUMIN: 3.4 g/dL — AB (ref 3.5–5.2)
ALT: 14 U/L (ref 0–35)
AST: 19 U/L (ref 0–37)
Alkaline Phosphatase: 67 U/L (ref 39–117)
Anion gap: 5 (ref 5–15)
BUN: 12 mg/dL (ref 6–23)
CHLORIDE: 109 meq/L (ref 96–112)
CO2: 25 mmol/L (ref 19–32)
Calcium: 8.6 mg/dL (ref 8.4–10.5)
Creatinine, Ser: 0.72 mg/dL (ref 0.50–1.10)
GFR calc non Af Amer: 84 mL/min — ABNORMAL LOW (ref >90)
GLUCOSE: 112 mg/dL — AB (ref 70–99)
Potassium: 4 mmol/L (ref 3.5–5.1)
SODIUM: 139 mmol/L (ref 135–145)
TOTAL PROTEIN: 6.3 g/dL (ref 6.0–8.3)
Total Bilirubin: 0.4 mg/dL (ref 0.3–1.2)

## 2014-11-16 LAB — CBC WITH DIFFERENTIAL/PLATELET
BASOS ABS: 0 10*3/uL (ref 0.0–0.1)
Basophils Relative: 0 % (ref 0–1)
EOS PCT: 4 % (ref 0–5)
Eosinophils Absolute: 0.3 10*3/uL (ref 0.0–0.7)
HEMATOCRIT: 42 % (ref 36.0–46.0)
Hemoglobin: 13.3 g/dL (ref 12.0–15.0)
LYMPHS ABS: 2.5 10*3/uL (ref 0.7–4.0)
Lymphocytes Relative: 36 % (ref 12–46)
MCH: 28.9 pg (ref 26.0–34.0)
MCHC: 31.7 g/dL (ref 30.0–36.0)
MCV: 91.1 fL (ref 78.0–100.0)
MONO ABS: 0.7 10*3/uL (ref 0.1–1.0)
MONOS PCT: 10 % (ref 3–12)
NEUTROS ABS: 3.5 10*3/uL (ref 1.7–7.7)
Neutrophils Relative %: 50 % (ref 43–77)
Platelets: 190 10*3/uL (ref 150–400)
RBC: 4.61 MIL/uL (ref 3.87–5.11)
RDW: 13.2 % (ref 11.5–15.5)
WBC: 7 10*3/uL (ref 4.0–10.5)

## 2014-11-16 LAB — I-STAT TROPONIN, ED: Troponin i, poc: 0.01 ng/mL (ref 0.00–0.08)

## 2014-11-16 LAB — TROPONIN I

## 2014-11-16 LAB — PROTIME-INR
INR: 1.33 (ref 0.00–1.49)
PROTHROMBIN TIME: 16.7 s — AB (ref 11.6–15.2)

## 2014-11-16 LAB — LACTIC ACID, PLASMA: LACTIC ACID, VENOUS: 0.9 mmol/L (ref 0.5–2.2)

## 2014-11-16 LAB — BRAIN NATRIURETIC PEPTIDE: B NATRIURETIC PEPTIDE 5: 222 pg/mL — AB (ref 0.0–100.0)

## 2014-11-16 MED ORDER — DILTIAZEM HCL 100 MG IV SOLR
5.0000 mg/h | Freq: Once | INTRAVENOUS | Status: AC
  Administered 2014-11-16: 5 mg/h via INTRAVENOUS

## 2014-11-16 MED ORDER — DILTIAZEM HCL 25 MG/5ML IV SOLN
15.0000 mg | Freq: Once | INTRAVENOUS | Status: AC
  Administered 2014-11-16: 15 mg via INTRAVENOUS

## 2014-11-16 MED ORDER — ALBUTEROL SULFATE HFA 108 (90 BASE) MCG/ACT IN AERS
2.0000 | INHALATION_SPRAY | Freq: Once | RESPIRATORY_TRACT | Status: AC
  Administered 2014-11-16: 2 via RESPIRATORY_TRACT
  Filled 2014-11-16: qty 6.7

## 2014-11-16 MED ORDER — METOPROLOL TARTRATE 1 MG/ML IV SOLN
5.0000 mg | Freq: Once | INTRAVENOUS | Status: AC
  Administered 2014-11-16: 5 mg via INTRAVENOUS
  Filled 2014-11-16: qty 5

## 2014-11-16 MED ORDER — METOPROLOL SUCCINATE ER 25 MG PO TB24
25.0000 mg | ORAL_TABLET | Freq: Two times a day (BID) | ORAL | Status: DC
Start: 2014-11-16 — End: 2014-12-10

## 2014-11-16 MED ORDER — METOPROLOL TARTRATE 25 MG PO TABS: 37.5000 mg | ORAL_TABLET | Freq: Two times a day (BID) | ORAL | Status: DC

## 2014-11-16 MED ORDER — BENZONATATE 100 MG PO CAPS
100.0000 mg | ORAL_CAPSULE | Freq: Once | ORAL | Status: AC
  Administered 2014-11-16: 100 mg via ORAL
  Filled 2014-11-16: qty 1

## 2014-11-16 MED ORDER — BENZONATATE 100 MG PO CAPS: 100.0000 mg | ORAL_CAPSULE | Freq: Three times a day (TID) | ORAL | Status: AC

## 2014-11-16 NOTE — ED Notes (Signed)
Spoke with Claris CheMargaret from ID, for a second time. Reports since pt travel is outside of 21 days, then there is no isolation needed. Made MD and CN aware.

## 2014-11-16 NOTE — ED Notes (Signed)
[facility]

## 2014-11-16 NOTE — ED Notes (Signed)
Pt came home from [place] 12/14 and then 3 days ago started having cough.  [place] stay in [place] n[place].  Pt reports cough, sob, and chills. Pt states when she coughs it feels like heart is beating too much and she feels lazy.  Mask applied at triage.

## 2014-11-16 NOTE — Discharge Instructions (Signed)
Atrial Fibrillation °Atrial fibrillation is a type of irregular heart rhythm (arrhythmia). During atrial fibrillation, the upper chambers of the heart (atria) quiver continuously in a chaotic pattern. This causes an irregular and often rapid heart rate.  °Atrial fibrillation is the result of the heart becoming overloaded with disorganized signals that tell it to beat. These signals are normally released one at a time by a part of the right atrium called the sinoatrial node. They then travel from the atria to the lower chambers of the heart (ventricles), causing the atria and ventricles to contract and pump blood as they pass. In atrial fibrillation, parts of the atria outside of the sinoatrial node also release these signals. This results in two problems. First, the atria receive so many signals that they do not have time to fully contract. Second, the ventricles, which can only receive one signal at a time, beat irregularly and out of rhythm with the atria.  °There are three types of atrial fibrillation:  °· Paroxysmal. Paroxysmal atrial fibrillation starts suddenly and stops on its own within a week. °· Persistent. Persistent atrial fibrillation lasts for more than a week. It may stop on its own or with treatment. °· Permanent. Permanent atrial fibrillation does not go away. Episodes of atrial fibrillation may lead to permanent atrial fibrillation. °Atrial fibrillation can prevent your heart from pumping blood normally. It increases your risk of stroke and can lead to heart failure.  °CAUSES  °· Heart conditions, including a heart attack, heart failure, coronary artery disease, and heart valve conditions.   °· Inflammation of the sac that surrounds the heart (pericarditis). °· Blockage of an artery in the lungs (pulmonary embolism). °· Pneumonia or other infections. °· Chronic lung disease. °· Thyroid problems, especially if the thyroid is overactive (hyperthyroidism). °· Caffeine, excessive alcohol use, and use  of some illegal drugs.   °· Use of some medicines, including certain decongestants and diet pills. °· Heart surgery.   °· Birth defects.   °Sometimes, no cause can be found. When this happens, the atrial fibrillation is called lone atrial fibrillation. The risk of complications from atrial fibrillation increases if you have lone atrial fibrillation and you are age 60 years or older. °RISK FACTORS °· Heart failure. °· Coronary artery disease. °· Diabetes mellitus.   °· High blood pressure (hypertension).   °· Obesity.   °· Other arrhythmias.   °· Increased age. °SIGNS AND SYMPTOMS  °· A feeling that your heart is beating rapidly or irregularly.   °· A feeling of discomfort or pain in your chest.   °· Shortness of breath.   °· Sudden light-headedness or weakness.   °· Getting tired easily when exercising.   °· Urinating more often than normal (mainly when atrial fibrillation first begins).   °In paroxysmal atrial fibrillation, symptoms may start and suddenly stop. °DIAGNOSIS  °Your health care provider may be able to detect atrial fibrillation when taking your pulse. Your health care provider may have you take a test called an ambulatory electrocardiogram (ECG). An ECG records your heartbeat patterns over a 24-hour period. You may also have other tests, such as: °· Transthoracic echocardiogram (TTE). During echocardiography, sound waves are used to evaluate how blood flows through your heart. °· Transesophageal echocardiogram (TEE). °· Stress test. There is more than one type of stress test. If a stress test is needed, ask your health care provider about which type is best for you. °· Chest X-ray exam. °· Blood tests. °· Computed tomography (CT). °TREATMENT  °Treatment may include: °· Treating any underlying conditions. For example, if you   have an overactive thyroid, treating the condition may correct atrial fibrillation.  Taking medicine. Medicines may be given to control a rapid heart rate or to prevent blood  clots, heart failure, or a stroke.  Having a procedure to correct the rhythm of the heart:  Electrical cardioversion. During electrical cardioversion, a controlled, low-energy shock is delivered to the heart through your skin. If you have chest pain, very low blood pressure, or sudden heart failure, this procedure may need to be done as an emergency.  Catheter ablation. During this procedure, heart tissues that send the signals that cause atrial fibrillation are destroyed.  Surgical ablation. During this surgery, thin lines of heart tissue that carry the abnormal signals are destroyed. This procedure can either be an open-heart surgery or a minimally invasive surgery. With the minimally invasive surgery, small cuts are made to access the heart instead of a large opening.  Pulmonary venous isolation. During this surgery, tissue around the veins that carry blood from the lungs (pulmonary veins) is destroyed. This tissue is thought to carry the abnormal signals. HOME CARE INSTRUCTIONS   Take medicines only as directed by your health care provider. Some medicines can make atrial fibrillation worse or recur.  If blood thinners were prescribed by your health care provider, take them exactly as directed. Too much blood-thinning medicine can cause bleeding. If you take too little, you will not have the needed protection against stroke and other problems.  Perform blood tests at home if directed by your health care provider. Perform blood tests exactly as directed.  Quit smoking if you smoke.  Do not drink alcohol.  Do not drink caffeinated beverages such as coffee, soda, and some teas. You may drink decaffeinated coffee, soda, or tea.   Maintain a healthy weight.Do not use diet pills unless your health care provider approves. They may make heart problems worse.   Follow diet instructions as directed by your health care provider.  Exercise regularly as directed by your health care  provider.  Keep all follow-up visits as directed by your health care provider. This is important. PREVENTION  The following substances can cause atrial fibrillation to recur:   Caffeinated beverages.  Alcohol.  Certain medicines, especially those used for breathing problems.  Certain herbs and herbal medicines, such as those containing ephedra or ginseng.  Illegal drugs, such as cocaine and amphetamines. Sometimes medicines are given to prevent atrial fibrillation from recurring. Proper treatment of any underlying condition is also important in helping prevent recurrence.  SEEK MEDICAL CARE IF:  You notice a change in the rate, rhythm, or strength of your heartbeat.  You suddenly begin urinating more frequently.  You tire more easily when exerting yourself or exercising. SEEK IMMEDIATE MEDICAL CARE IF:   You have chest pain, abdominal pain, sweating, or weakness.  You feel nauseous.  You have shortness of breath.  You suddenly have swollen feet and ankles.  You feel dizzy.  Your face or limbs feel numb or weak.  You have a change in your vision or speech. MAKE SURE YOU:   Understand these instructions.  Will watch your condition.  Will get help right away if you are not doing well or get worse. Document Released: 10/24/2005 Document Revised: 03/10/2014 Document Reviewed: 12/04/2012 [facility] Patient Information 2015 [place], [place]. This information is not intended to replace advice given to you by your health care provider. Make sure you discuss any questions you have with your health care provider.  Acute Bronchitis Bronchitis is inflammation  of the airways that extend from the windpipe into the lungs (bronchi). The inflammation often causes mucus to develop. This leads to a cough, which is the most common symptom of bronchitis.  In acute bronchitis, the condition usually develops suddenly and goes away over time, usually in a couple weeks. Smoking, allergies, and  asthma can make bronchitis worse. Repeated episodes of bronchitis may cause further lung problems.  CAUSES Acute bronchitis is most often caused by the same virus that causes a cold. The virus can spread from person to person (contagious) through coughing, sneezing, and touching contaminated objects. SIGNS AND SYMPTOMS   Cough.   Fever.   Coughing up mucus.   Body aches.   Chest congestion.   Chills.   Shortness of breath.   Sore throat.  DIAGNOSIS  Acute bronchitis is usually diagnosed through a physical exam. Your health care provider will also ask you questions about your medical history. Tests, such as chest X-rays, are sometimes done to rule out other conditions.  TREATMENT  Acute bronchitis usually goes away in a couple weeks. Oftentimes, no medical treatment is necessary. Medicines are sometimes given for relief of fever or cough. Antibiotic medicines are usually not needed but may be prescribed in certain situations. In some cases, an inhaler may be recommended to help reduce shortness of breath and control the cough. A cool mist vaporizer may also be used to help thin bronchial secretions and make it easier to clear the chest.  HOME CARE INSTRUCTIONS  Get plenty of rest.   Drink enough fluids to keep your urine clear or pale yellow (unless you have a medical condition that requires fluid restriction). Increasing fluids may help thin your respiratory secretions (sputum) and reduce chest congestion, and it will prevent dehydration.   Take medicines only as directed by your health care provider.  If you were prescribed an antibiotic medicine, finish it all even if you start to feel better.  Avoid smoking and secondhand smoke. Exposure to cigarette smoke or irritating chemicals will make bronchitis worse. If you are a smoker, consider using nicotine gum or skin patches to help control withdrawal symptoms. Quitting smoking will help your lungs heal faster.   Reduce  the chances of another bout of acute bronchitis by washing your hands frequently, avoiding people with cold symptoms, and trying not to touch your hands to your mouth, nose, or eyes.   Keep all follow-up visits as directed by your health care provider.  SEEK MEDICAL CARE IF: Your symptoms do not improve after 1 week of treatment.  SEEK IMMEDIATE MEDICAL CARE IF:  You develop an increased fever or chills.   You have chest pain.   You have severe shortness of breath.  You have bloody sputum.   You develop dehydration.  You faint or repeatedly feel like you are going to pass out.  You develop repeated vomiting.  You develop a severe headache. MAKE SURE YOU:   Understand these instructions.  Will watch your condition.  Will get help right away if you are not doing well or get worse. Document Released: 12/01/2004 Document Revised: 03/10/2014 Document Reviewed: 04/16/2013 [facility] Patient Information 2015 [employer], [place]. This information is not intended to replace advice given to you by your health care provider. Make sure you discuss any questions you have with your health care provider.

## 2014-11-16 NOTE — ED Notes (Signed)
Spoke with infection prevention margaret, reports she spoke with Dr. Orvan Falconerampbell who thinks the patient should be placed on airborne and contact isolation. MD made aware, along with charge RN.

## 2014-11-16 NOTE — ED Provider Notes (Signed)
CSN: [id number]     Arrival date & time 11/16/14  1418 History   None    Chief Complaint  Patient presents with  . Tachycardia  . Cough     (Consider location/radiation/quality/duration/timing/severity/associated sxs/prior Treatment) Patient is a 74 y.o. female presenting with cough.  Cough Cough characteristics:  Non-productive and dry Severity:  Severe Onset quality:  Gradual Duration:  3 days Timing:  Constant Progression:  Worsening Chronicity:  New Smoker: no   Relieved by: cough syrup tid every 6 hours but not helping. Associated symptoms: chest pain (yesterday pain with cough)   Associated symptoms: no fever, no headaches, no rash, no rhinorrhea, no shortness of breath (when cough), no sinus congestion and no sore throat   Risk factors: recent travel (one month ago)   Risk factors comment:  Daughter with cough   Past Medical History  Diagnosis Date  . Arthritis   . Hypertension    History reviewed. No pertinent past surgical history. No family history on file. History  Substance Use Topics  . Smoking status: Never Smoker   . Smokeless tobacco: Never Used  . Alcohol Use: No   OB History    No data available     Review of Systems  Constitutional: Positive for activity change ("feeling lazy") and fatigue. Negative for fever.  HENT: Negative for rhinorrhea and sore throat.   Eyes: Negative for visual disturbance.  Respiratory: Positive for cough. Negative for shortness of breath (when cough).   Cardiovascular: Positive for chest pain (yesterday pain with cough) and palpitations. Negative for leg swelling.  Gastrointestinal: Negative for nausea, vomiting, abdominal pain and diarrhea.  Genitourinary: Negative for difficulty urinating.  Musculoskeletal: Negative for back pain and neck pain.  Skin: Negative for rash.  Neurological: Negative for syncope and headaches.      Allergies  Shrimp  Home Medications   Prior to Admission medications   Medication  Sig Start Date End Date Taking? Authorizing Provider  amiodarone (PACERONE) 200 MG tablet Take 1 tablet (200 mg total) by mouth 2 (two) times daily. 04/25/13   Robynn PaneMohan N Harwani, MD  apixaban (ELIQUIS) 5 MG TABS tablet Take 1 tablet (5 mg total) by mouth 2 (two) times daily. 04/25/13   Robynn PaneMohan N Harwani, MD  aspirin EC 81 MG EC tablet Take 1 tablet (81 mg total) by mouth daily. 04/25/13   Robynn PaneMohan N Harwani, MD  metoprolol tartrate (LOPRESSOR) 25 MG tablet Take 1 tablet (25 mg total) by mouth 2 (two) times daily. 04/25/13   Robynn PaneMohan N Harwani, MD  spironolactone (ALDACTONE) 25 MG tablet Take 1 tablet (25 mg total) by mouth daily. 04/25/13   Robynn PaneMohan N Harwani, MD  Vitamin D, Ergocalciferol, (DRISDOL) 50000 UNITS CAPS Take 50,000 Units by mouth every 7 (seven) days. Taken on Sundays or Mondays.    Historical Provider, MD   BP 120/84 mmHg  Pulse 124  Temp(Src) 98.1 F (36.7 C) (Oral)  Resp 16  SpO2 96% Physical Exam  Constitutional: She is oriented to person, place, and time. She appears well-developed and well-nourished. No distress.  HENT:  Head: Normocephalic and atraumatic.  Eyes: Conjunctivae and EOM are normal.  Neck: Normal range of motion.  Cardiovascular: Normal heart sounds and intact distal pulses.  An irregularly irregular rhythm present. Tachycardia present.  Exam reveals no gallop and no friction rub.   No murmur heard. Pulmonary/Chest: Effort normal. No respiratory distress. She has wheezes (occasional). She has no rales.  Abdominal: Soft. She exhibits no distension. There is  no tenderness. There is no guarding.  Musculoskeletal: She exhibits no edema or tenderness.  Neurological: She is alert and oriented to person, place, and time.  Skin: Skin is warm and dry. No rash noted. She is not diaphoretic. No erythema.  Nursing note and vitals reviewed.   ED Course  Procedures (including critical care time) Labs Review Labs Reviewed  COMPREHENSIVE METABOLIC PANEL - Abnormal; Notable for the  following:    Glucose, Bld 112 (*)    Albumin 3.4 (*)    GFR calc non Af Amer 84 (*)    All other components within normal limits  BRAIN NATRIURETIC PEPTIDE - Abnormal; Notable for the following:    B Natriuretic Peptide 222.0 (*)    All other components within normal limits  PROTIME-INR - Abnormal; Notable for the following:    Prothrombin Time 16.7 (*)    All other components within normal limits  CBC WITH DIFFERENTIAL  LACTIC ACID, PLASMA  TROPONIN I    Imaging Review No results found.   EKG Interpretation   Date/Time:  Sunday November 16 2014 14:28:00 EST Ventricular Rate:  121 PR Interval:    QRS Duration: 78 QT Interval:  344 QTC Calculation: 488 R Axis:   76 Text Interpretation:  Atrial fibrillation with rapid ventricular response  Abnormal ECG Atrial fibrillation with rapid ventricular response Abnormal  ekg Confirmed by Gerhard Munch  MD [id number]) on 11/16/2014 3:20:35 PM      MDM   Final diagnoses:  Cough    74 year old female with a history of hypertension, episode of atrial fibrillation with acute pulmonary edema in June 2014 for which she was started eloquis presents with concern of dry cough.  Patient returned from the [place] over 21 days ago, and developed symptoms 3 days ago, and given greater than 14 days since travel she is low risk for MERS.  Patient is in atrial fibrillation with a rapid ventricular response rate in the 120s.  Patient does have this history of atrial fibrillation in the past, denies any shortness of breath, does not have hypoxia, asymmetric leg swelling or chest pain to suggest pulmonary embolus.  Chest x-ray ordered to evaluate for signs of pulmonary edema or pneumonia and showed bronchitis. CBC shows no signs of anemia and no leukocytosis.  CMP and PT/INR WNL.  Troponin and lactic acid negative.  BNP marginal at 222.  Patient given  lopressor for atrial fibrillation with RVR without any change in HR.  Given no change, given bolus  of diltiazem and started on diltiazem drip.  Given tessalon and albuterol for cough relief.  Given history and physical, patient in atrial fibrillation with RVR likely in setting of viral infection.  HR decreased to 90s in atrial fibrillation after dilatiazem drip and bolus at 5.  After one hour, the drip was discontinued and patient was observed for one hour without return of RVR.  Discussed with Cardiology and will increase her home metoprolol dose and have her follow up with cardiology.  Increased from  metoprolol QD to  BID.  Discussed need for patient to find PCP and Cardiologist in this area. (Current PCP in [place]). Gave rx for tessalon and increased metoprolol dosing and discussed reasons to return. Patient discharged in stable condition with understanding of reasons to return.   Rhae Lerner, MD 11/17/14 0045  Gerhard Munch, MD 11/19/14 [phone]

## 2014-12-08 ENCOUNTER — Emergency Department (HOSPITAL_COMMUNITY): Payer: Medicaid Other

## 2014-12-08 ENCOUNTER — Emergency Department (HOSPITAL_COMMUNITY)
Admission: EM | Admit: 2014-12-08 | Discharge: 2014-12-08 | Disposition: A | Discharge status: Home / Self Care | Payer: Medicaid Other | Attending: Emergency Medicine | Admitting: Emergency Medicine

## 2014-12-08 ENCOUNTER — Encounter (HOSPITAL_COMMUNITY): Payer: Self-pay | Admitting: Emergency Medicine

## 2014-12-08 DIAGNOSIS — M1990 Unspecified osteoarthritis, unspecified site: Secondary | ICD-10-CM | POA: Diagnosis not present

## 2014-12-08 DIAGNOSIS — Z79899 Other long term (current) drug therapy: Secondary | ICD-10-CM | POA: Insufficient documentation

## 2014-12-08 DIAGNOSIS — I509 Heart failure, unspecified: Secondary | ICD-10-CM

## 2014-12-08 DIAGNOSIS — I10 Essential (primary) hypertension: Secondary | ICD-10-CM | POA: Insufficient documentation

## 2014-12-08 DIAGNOSIS — Z7902 Long term (current) use of antithrombotics/antiplatelets: Secondary | ICD-10-CM | POA: Insufficient documentation

## 2014-12-08 DIAGNOSIS — R05 Cough: Secondary | ICD-10-CM | POA: Diagnosis present

## 2014-12-08 LAB — COMPREHENSIVE METABOLIC PANEL
ALBUMIN: 3.3 g/dL — AB (ref 3.5–5.2)
ALK PHOS: 61 U/L (ref 39–117)
ALT: 63 U/L — AB (ref 0–35)
AST: 47 U/L — ABNORMAL HIGH (ref 0–37)
Anion gap: 8 (ref 5–15)
BILIRUBIN TOTAL: 0.6 mg/dL (ref 0.3–1.2)
BUN: 16 mg/dL (ref 6–23)
CALCIUM: 8.8 mg/dL (ref 8.4–10.5)
CHLORIDE: 108 mmol/L (ref 96–112)
CO2: 27 mmol/L (ref 19–32)
Creatinine, Ser: 0.74 mg/dL (ref 0.50–1.10)
GFR calc Af Amer: >90 mL/min (ref >90)
GFR calc non Af Amer: 83 mL/min — ABNORMAL LOW (ref >90)
Glucose, Bld: 98 mg/dL (ref 70–99)
POTASSIUM: 4.1 mmol/L (ref 3.5–5.1)
Sodium: 143 mmol/L (ref 135–145)
TOTAL PROTEIN: 6.1 g/dL (ref 6.0–8.3)

## 2014-12-08 LAB — BRAIN NATRIURETIC PEPTIDE: B Natriuretic Peptide: 394.4 pg/mL — ABNORMAL HIGH (ref 0.0–100.0)

## 2014-12-08 LAB — CBC WITH DIFFERENTIAL/PLATELET
Basophils Absolute: 0 10*3/uL (ref 0.0–0.1)
Basophils Relative: 0 % (ref 0–1)
Eosinophils Absolute: 0.2 10*3/uL (ref 0.0–0.7)
Eosinophils Relative: 2 % (ref 0–5)
HEMATOCRIT: 42.2 % (ref 36.0–46.0)
HEMOGLOBIN: 13.2 g/dL (ref 12.0–15.0)
LYMPHS PCT: 33 % (ref 12–46)
Lymphs Abs: 3 10*3/uL (ref 0.7–4.0)
MCH: 28.8 pg (ref 26.0–34.0)
MCHC: 31.3 g/dL (ref 30.0–36.0)
MCV: 91.9 fL (ref 78.0–100.0)
MONO ABS: 0.8 10*3/uL (ref 0.1–1.0)
MONOS PCT: 9 % (ref 3–12)
NEUTROS ABS: 5 10*3/uL (ref 1.7–7.7)
Neutrophils Relative %: 56 % (ref 43–77)
Platelets: 235 10*3/uL (ref 150–400)
RBC: 4.59 MIL/uL (ref 3.87–5.11)
RDW: 13.4 % (ref 11.5–15.5)
WBC: 9 10*3/uL (ref 4.0–10.5)

## 2014-12-08 LAB — TROPONIN I

## 2014-12-08 MED ORDER — FUROSEMIDE 20 MG PO TABS: 60.0000 mg | ORAL_TABLET | Freq: Once | ORAL | Status: DC

## 2014-12-08 MED ORDER — FUROSEMIDE 40 MG PO TABS: 40.0000 mg | ORAL_TABLET | Freq: Every day | ORAL | Status: DC

## 2014-12-08 MED ORDER — DILTIAZEM HCL 25 MG/5ML IV SOLN
20.0000 mg | Freq: Once | INTRAVENOUS | Status: AC
  Administered 2014-12-08: 20 mg via INTRAVENOUS
  Filled 2014-12-08: qty 5

## 2014-12-08 MED ORDER — FUROSEMIDE 10 MG/ML IJ SOLN
60.0000 mg | Freq: Once | INTRAMUSCULAR | Status: AC
  Administered 2014-12-08: 60 mg via INTRAVENOUS
  Filled 2014-12-08: qty 6

## 2014-12-08 NOTE — ED Notes (Signed)
Doctor at bedside.

## 2014-12-08 NOTE — ED Notes (Signed)
EKG completed given to EDP.  

## 2014-12-08 NOTE — ED Provider Notes (Signed)
CSN: [id number]     Arrival date & time 12/08/14  1058 History   First MD Initiated Contact with Patient 12/08/14 1104     Chief Complaint  Patient presents with  . Cough  . Shortness of Breath     HPI Patient has a history of chronic atrial fibrillation.  She presents to the ER because of cough for several days with some shortness of breath when laying down 3 days ago.  She has some exertional shortness of breath as well.  She denies palpitations.  She states compliance with her medications.  She denies productive cough.  No fevers or chills.  No unilateral leg swelling.  No history DVT or pulmonary embolism.  Her symptoms are mild in severity.  Her cough kept her up at night last night and she had some orthopnea last night as well.   Past Medical History  Diagnosis Date  . Arthritis   . Hypertension    History reviewed. No pertinent past surgical history. No family history on file. History  Substance Use Topics  . Smoking status: Never Smoker   . Smokeless tobacco: Never Used  . Alcohol Use: No   OB History    No data available     Review of Systems  All other systems reviewed and are negative.     Allergies  Shrimp  Home Medications   Prior to Admission medications   Medication Sig Start Date End Date Taking? Authorizing Provider  apixaban (ELIQUIS) 5 MG TABS tablet Take 1 tablet (5 mg total) by mouth 2 (two) times daily. Patient taking differently: Take 5 mg by mouth daily.  04/25/13  Yes Mohan N Harwani, MD  metoprolol succinate (TOPROL-XL) 25 MG 24 hr tablet Take 1 tablet (25 mg total) by mouth 2 (two) times daily. Patient taking differently: Take 25 mg by mouth daily.  11/16/14  Yes Erin Schlossman, MD  PredniSONE 10 MG KIT Take 10-60 mg by mouth daily. 12/05/14  Yes Historical Provider, MD  amiodarone (PACERONE) 200 MG tablet Take 1 tablet (200 mg total) by mouth 2 (two) times daily. Patient not taking: Reported on 12/08/2014 04/25/13   Mohan N Harwani, MD  aspirin  EC 81 MG EC tablet Take 1 tablet (81 mg total) by mouth daily. Patient not taking: Reported on 12/08/2014 04/25/13   Mohan N Harwani, MD  furosemide (LASIX) 40 MG tablet Take 1 tablet (40 mg total) by mouth daily. 12/08/14   Kevin M Campos, MD  spironolactone (ALDACTONE) 25 MG tablet Take 1 tablet (25 mg total) by mouth daily. Patient not taking: Reported on 12/08/2014 04/25/13   Mohan N Harwani, MD   BP 158/94 mmHg  Pulse 91  Temp(Src) 97.6 F (36.4 C) (Oral)  Resp 20  Ht 5' 5" (1.651 m)  Wt 185 lb (83.915 kg)  BMI 30.79 kg/m2  SpO2 95% Physical Exam  Constitutional: She is oriented to person, place, and time. She appears well-developed and well-nourished. No distress.  HENT:  Head: Normocephalic and atraumatic.  Eyes: EOM are normal.  Neck: Normal range of motion.  Cardiovascular: Normal rate, regular rhythm and normal heart sounds.   Pulmonary/Chest: Effort normal and breath sounds normal. No respiratory distress.  Abdominal: Soft. She exhibits no distension. There is no tenderness.  Musculoskeletal: Normal range of motion.  Neurological: She is alert and oriented to person, place, and time.  Skin: Skin is warm and dry.  Psychiatric: She has a normal mood and affect. Judgment normal.  Nursing note and   vitals reviewed.   ED Course  Procedures (including critical care time) Labs Review Labs Reviewed  COMPREHENSIVE METABOLIC PANEL - Abnormal; Notable for the following:    Albumin 3.3 (*)    AST 47 (*)    ALT 63 (*)    GFR calc non Af Amer 83 (*)    All other components within normal limits  BRAIN NATRIURETIC PEPTIDE - Abnormal; Notable for the following:    B Natriuretic Peptide 394.4 (*)    All other components within normal limits  CBC WITH DIFFERENTIAL/PLATELET  TROPONIN I    Imaging Review Dg Chest 2 View  12/08/2014   CLINICAL DATA:  Cough for approximately 2 weeks. Shortness of breath while lying down beginning 3 days ago.  EXAM: CHEST  2 VIEW  COMPARISON:  PA and  lateral chest 11/16/2014.  FINDINGS: There is cardiomegaly and mild interstitial edema. No consolidative process, pneumothorax or effusion is seen.  IMPRESSION: Cardiomegaly and mild interstitial edema.   Electronically Signed   By: Thomas  Dalessio M.D.   On: 12/08/2014 12:32  I personally reviewed the imaging tests through PACS system I reviewed available ER/hospitalization records through the EMR    EKG Interpretation   Date/Time:  Monday December 08 2014 11:25:49 EST Ventricular Rate:  113 PR Interval:    QRS Duration: 83 QT Interval:  368 QTC Calculation: 505 R Axis:   78 Text Interpretation:  Atrial fibrillation Borderline T wave abnormalities  Prolonged QT interval No significant change was found Confirmed by CAMPOS   MD, KEVIN ([id number]) on 12/08/2014 1:21:48 PM      MDM   Final diagnoses:  CHF exacerbation    Likely mild congestive heart failure exacerbation.  Patient given a dose of Cardizem in the emergency department as she had some rapid ventricular response to her atrial fibrillation with a highest heart rate that I noted of 120.  After dose of Cardizem the patient's heart rate is 97.  She's feeling better after IV Lasix.  I do not think the patient needs to be admitted to the hospital this time.  She will be placed on daily Lasix 5 days.  A vas that she follow-up with the cardiologist.  She understands return to the ER for new or worsening symptoms    Kevin M Campos, MD 12/08/14 1531 

## 2014-12-08 NOTE — ED Notes (Signed)
EDP at bedside  

## 2014-12-08 NOTE — ED Notes (Addendum)
Attempted to use interpreter phone with questions pt and pt family wanted to clarify about medical orders. Pt family member hung up phone and stated that she understood discharge papers and pt stated that she understood and had no further questions.

## 2014-12-08 NOTE — Discharge Instructions (Signed)

## 2014-12-08 NOTE — ED Notes (Signed)
Onset couple of weeks ago cough and was seen Jan 10th in ED overtime developed shortness of breath while laying down 3 days ago.  Was given prednisone took for 2 days and stopped because did not like how she felt.

## 2014-12-10 ENCOUNTER — Ambulatory Visit (INDEPENDENT_AMBULATORY_CARE_PROVIDER_SITE_OTHER): Payer: Medicaid Other | Admitting: Cardiology

## 2014-12-10 ENCOUNTER — Encounter: Payer: Self-pay | Admitting: Cardiology

## 2014-12-10 VITALS — BP 130/82 | HR 86 | Ht 63.0 in | Wt 185.0 lb

## 2014-12-10 DIAGNOSIS — I5032 Chronic diastolic (congestive) heart failure: Secondary | ICD-10-CM

## 2014-12-10 DIAGNOSIS — I482 Chronic atrial fibrillation, unspecified: Secondary | ICD-10-CM

## 2014-12-10 DIAGNOSIS — R0600 Dyspnea, unspecified: Secondary | ICD-10-CM | POA: Diagnosis not present

## 2014-12-10 DIAGNOSIS — I4821 Permanent atrial fibrillation: Secondary | ICD-10-CM | POA: Insufficient documentation

## 2014-12-10 MED ORDER — METOPROLOL SUCCINATE ER 25 MG PO TB24: 25.0000 mg | ORAL_TABLET | Freq: Two times a day (BID) | ORAL | Status: DC

## 2014-12-10 MED ORDER — FUROSEMIDE 40 MG PO TABS: 40.0000 mg | ORAL_TABLET | Freq: Every day | ORAL | Status: DC

## 2014-12-10 MED ORDER — APIXABAN 5 MG PO TABS: 5.0000 mg | ORAL_TABLET | Freq: Two times a day (BID) | ORAL | Status: DC

## 2014-12-10 NOTE — Patient Instructions (Signed)
Your physician has requested that you have an echocardiogram. Echocardiography is a painless test that uses sound waves to create images of your heart. It provides your doctor with information about the size and shape of your heart and how well your heart's chambers and valves are working. This procedure takes approximately one hour. There are no restrictions for this procedure.  Your physician recommends that you schedule a follow-up appointment in: 2 month ov/ekg/bmet

## 2014-12-10 NOTE — Progress Notes (Signed)
Chelsea [name] Date of Birth:  06/09/1941 [facility] [address] [place], [place]  [address] [phone]        Fax   [phone]   History of Present Illness: This pleasant 74 year old woman is seen by me for the first time in the office today.  She was referred from the emergency room.  He is being seen because of atrial fibrillation and congestive heart failure.  He has a history of atrial fibrillation dating back at least 2 years.  Also has a past history of pulmonary edema about 2 years ago.  Recently she presented to [facility] emergency room with worsening dyspnea and dry cough, orthopnea, and paroxysmal nocturnal dyspnea and weight gain.  She was found to be in congestive heart failure.  Chest x-ray on 12/08/14 showed cardiomegaly and mild interstitial pulmonary edema. She was placed on Apixaban, Lasix, and Toprol-XL 25 mg twice a day.  Since then she has felt better.  She is now sleeping on one pillow.  She has had no further chest discomfort.  Her weight has come down and her edema has resolved. He had an echocardiogram in June 2014 which showed ejection fraction of 55-60% and she was in atrial fibrillation at that time. She has not had any history of embolus or stroke. She does not have any history of hypertension or diabetes.  He states that her cholesterol has been described without therapy.  However, her last lipid panel was in 2014 and showed an LDL of 132 Her family history reveals that her father died of congestive heart failure.  Her mother died of dementia. Current Outpatient Prescriptions  Medication Sig Dispense Refill  . apixaban (ELIQUIS) 5 MG TABS tablet Take 1 tablet (5 mg total) by mouth 2 (two) times daily. 180 tablet 1  . furosemide (LASIX) 40 MG tablet Take 1 tablet (40 mg total) by mouth daily. 90 tablet 1  . metoprolol succinate (TOPROL-XL) 25 MG 24 hr tablet Take 1 tablet (25 mg total) by mouth 2 (two) times daily. 180 tablet 1    No current facility-administered medications for this visit.    Allergies  Allergen Reactions  . Shrimp [Shellfish Allergy] Hives    Patient Active Problem List   Diagnosis Date Noted  . Chronic diastolic heart failure 12/10/2014  . Atrial fibrillation 12/10/2014  . Paroxysmal nocturnal dyspnea 12/10/2014    History  Smoking status  . Never Smoker   Smokeless tobacco  . Never Used    History  Alcohol Use No    Family History  Problem Relation Age of Onset  . Heart failure Father     Review of Systems: Constitutional: no fever chills diaphoresis or fatigue or change in weight.  Head and neck: no hearing loss, no epistaxis, no photophobia or visual disturbance. Respiratory: No cough, shortness of breath or wheezing. Cardiovascular: No chest pain peripheral edema, palpitations. Gastrointestinal: No abdominal distention, no abdominal pain, no change in bowel habits hematochezia or melena. Genitourinary: No dysuria, no frequency, no urgency, no nocturia. Musculoskeletal:No arthralgias, no back pain, no gait disturbance or myalgias. Neurological: No dizziness, no headaches, no numbness, no seizures, no syncope, no weakness, no tremors. Hematologic: No lymphadenopathy, no easy bruising. Psychiatric: No confusion, no hallucinations, no sleep disturbance.   Wt Readings from Last 3 Encounters:  12/10/14 185 lb (83.915 kg)  12/08/14 185 lb (83.915 kg)  04/25/13 186 lb 4.6 oz (84.5 kg)    Physical Exam: Filed Vitals:  12/10/14 1456  BP: 130/82  Pulse: 86  The patient appears to be in no distress.  Head and neck exam reveals that the pupils are equal and reactive.  The extraocular movements are full.  There is no scleral icterus.  Mouth and pharynx are benign.  No lymphadenopathy.  No carotid bruits.  The jugular venous pressure is normal.  Thyroid is not enlarged or tender.  Chest reveals minimal basilar rales.  No dullness to percussion.  Expansion of the chest is  symmetrical.  Heart reveals no abnormal lift or heave.  Pulse is irregularly irregular.  First and second heart sounds are normal.  There is no murmur gallop rub or click.  The abdomen is soft and nontender.  Bowel sounds are normoactive.  There is no hepatosplenomegaly or mass.  There are no abdominal bruits.  Extremities reveal no phlebitis.  There is trace edema.  Pedal pulses are good.  There is no cyanosis or clubbing.  Neurologic exam is normal strength and no lateralizing weakness.  No sensory deficits.  Integument reveals no rash    Assessment / Plan: 1.  Chronic diastolic heart failure 2.  Chronic atrial fibrillation.  Chads vasc is 3 for heart failure, age, and female sex. 3.  Hypercholesterolemia  Disposition: We will continue her on her current medications which are metoprolol, Apixaban, and furosemide.  We will update her echocardiogram.  We talked to her and her daughter about the importance of taking the Apixaban twice a day as prescribed, to prevent stroke or other embolic events. We will plan to recheck the patient in 2 months for office visit EKG and basal metabolic panel lipid panel and hepatic function panel and CBC

## 2014-12-15 ENCOUNTER — Ambulatory Visit (HOSPITAL_COMMUNITY): Payer: Medicaid Other | Attending: Cardiology

## 2014-12-15 DIAGNOSIS — I5032 Chronic diastolic (congestive) heart failure: Secondary | ICD-10-CM | POA: Insufficient documentation

## 2014-12-15 DIAGNOSIS — I482 Chronic atrial fibrillation, unspecified: Secondary | ICD-10-CM

## 2014-12-15 NOTE — Progress Notes (Signed)
2D Echo completed. 12/15/2014 

## 2015-02-24 ENCOUNTER — Ambulatory Visit (INDEPENDENT_AMBULATORY_CARE_PROVIDER_SITE_OTHER): Payer: Medicaid Other | Admitting: Cardiology

## 2015-02-24 ENCOUNTER — Encounter: Payer: Self-pay | Admitting: Cardiology

## 2015-02-24 VITALS — BP 144/92 | HR 117 | Ht 63.0 in | Wt 185.0 lb

## 2015-02-24 DIAGNOSIS — I10 Essential (primary) hypertension: Secondary | ICD-10-CM | POA: Diagnosis not present

## 2015-02-24 DIAGNOSIS — I5032 Chronic diastolic (congestive) heart failure: Secondary | ICD-10-CM | POA: Diagnosis not present

## 2015-02-24 DIAGNOSIS — I482 Chronic atrial fibrillation, unspecified: Secondary | ICD-10-CM

## 2015-02-24 DIAGNOSIS — R0600 Dyspnea, unspecified: Secondary | ICD-10-CM | POA: Diagnosis not present

## 2015-02-24 LAB — HEPATIC FUNCTION PANEL
ALK PHOS: 97 U/L (ref 39–117)
ALT: 44 U/L — ABNORMAL HIGH (ref 0–35)
AST: 38 U/L — AB (ref 0–37)
Albumin: 3.5 g/dL (ref 3.5–5.2)
Bilirubin, Direct: 0.1 mg/dL (ref 0.0–0.3)
TOTAL PROTEIN: 7 g/dL (ref 6.0–8.3)
Total Bilirubin: 0.4 mg/dL (ref 0.2–1.2)

## 2015-02-24 LAB — BASIC METABOLIC PANEL
BUN: 16 mg/dL (ref 6–23)
CHLORIDE: 104 meq/L (ref 96–112)
CO2: 31 meq/L (ref 19–32)
Calcium: 9.1 mg/dL (ref 8.4–10.5)
Creatinine, Ser: 0.81 mg/dL (ref 0.40–1.20)
GFR: 73.71 mL/min (ref >60.00)
GLUCOSE: 105 mg/dL — AB (ref 70–99)
Potassium: 3.6 mEq/L (ref 3.5–5.1)
Sodium: 140 mEq/L (ref 135–145)

## 2015-02-24 LAB — CBC WITH DIFFERENTIAL/PLATELET
BASOS ABS: 0.1 10*3/uL (ref 0.0–0.1)
BASOS PCT: 0.5 % (ref 0.0–3.0)
Eosinophils Absolute: 0.2 10*3/uL (ref 0.0–0.7)
Eosinophils Relative: 2.3 % (ref 0.0–5.0)
HEMATOCRIT: 37.5 % (ref 36.0–46.0)
HEMOGLOBIN: 12 g/dL (ref 12.0–15.0)
LYMPHS ABS: 3.3 10*3/uL (ref 0.7–4.0)
Lymphocytes Relative: 32.6 % (ref 12.0–46.0)
MCHC: 32.1 g/dL (ref 30.0–36.0)
MCV: 84.3 fl (ref 78.0–100.0)
Monocytes Absolute: 0.8 10*3/uL (ref 0.1–1.0)
Monocytes Relative: 7.9 % (ref 3.0–12.0)
Neutro Abs: 5.7 10*3/uL (ref 1.4–7.7)
Neutrophils Relative %: 56.7 % (ref 43.0–77.0)
Platelets: 238 10*3/uL (ref 150.0–400.0)
RBC: 4.45 Mil/uL (ref 3.87–5.11)
RDW: 15 % (ref 11.5–15.5)
WBC: 10.1 10*3/uL (ref 4.0–10.5)

## 2015-02-24 MED ORDER — METOPROLOL SUCCINATE ER 50 MG PO TB24: 50.0000 mg | ORAL_TABLET | Freq: Two times a day (BID) | ORAL | Status: DC

## 2015-02-24 NOTE — Patient Instructions (Addendum)
Medication Instructions:  INCREASE YOUR METOPROLOL TO 50 MG TWICE A DAY, NEW RX SENT TO [facility]   Labwork: CBC/HFP/BMET  Testing/Procedures: NONE  Follow-Up: Your physician wants you to follow-up in: 4 MONTH OV/EKG  You will receive a reminder letter in the mail two months in advance. If you don't receive a letter, please call our office to schedule the follow-up appointment.

## 2015-02-24 NOTE — Progress Notes (Signed)
Cardiology Office Note   Date:  02/24/2015   ID:  Chelsea [name], MRN [id number]  PCP:  No PCP Per Patient  Cardiologist:   Cassell Clementhomas Latiffany Harwick, MD   No chief complaint on file.     History of Present Illness: Chelsea [name] is a 74 y.o. female who presents for 2 month follow-up office visit.  This pleasant 74 year old woman was initially seen by me for the first time 2 months ago.[name]. She was referred from the emergency room. He is being seen because of atrial fibrillation and congestive heart failure. He has a history of atrial fibrillation dating back at least 2 years. Also has a past history of pulmonary edema about 2 years ago. Recently she [facility] room with worsening dyspnea and dry cough, orthopnea, and paroxysmal nocturnal dyspnea and weight gain. She was found to be in congestive heart failure. Chest x-ray on 12/08/14 showed cardiomegaly and mild interstitial pulmonary edema. She was placed on Apixaban, Lasix, and Toprol-XL 25 mg twice a day. Since then she has felt better. She is now sleeping on one pillow. She has had no further chest discomfort. Her weight has come down and her edema has resolved. He had an echocardiogram in June 2014 which showed ejection fraction of 55-60% and she was in atrial fibrillation at that time.  We updated her echocardiogram on 12/15/14 it showed an ejection fraction of 55-60% and mild to moderate mitral regurgitation. She has not had any history of embolus or stroke. She does not have any history of hypertension or diabetes. He states that her cholesterol has been described without therapy. However, her last lipid panel was in 2014 and showed an LDL of 132 Her family history reveals that her father died of congestive heart failure. Her mother died of dementia. Since her last saw her she went to [place] to visit family.  While she was there she had a right-sided chest pain and was hospitalized for 3  days.  No evidence of myocardial infarction was found.  She did not have cardiac catheterization.  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  Past Medical History  Diagnosis Date  . Arthritis   . Hypertension     History reviewed. No pertinent past surgical history.   Current Outpatient Prescriptions  Medication Sig Dispense Refill  . apixaban (ELIQUIS) 5 MG TABS tablet Take 1 tablet (5 mg total) by mouth 2 (two) times daily. 180 tablet 1  . furosemide (LASIX) 40 MG tablet Take 1 tablet (40 mg total) by mouth daily. 90 tablet 1  . lisinopril (PRINIVIL,ZESTRIL) 2.5 MG tablet Take 2.5 mg by mouth daily.    . metoprolol succinate (TOPROL-XL) 50 MG 24 hr tablet Take 1 tablet (50 mg total) by mouth 2 (two) times daily. 60 tablet 5   No current facility-administered medications for this visit.    Allergies:   Shrimp    Social History:  The patient  reports that she has never smoked. She has never used smokeless tobacco. She reports that she does not drink alcohol or use illicit drugs.   Family History:  The patient's family history includes Heart failure in her father.    ROS:  Please see the history of present illness.   Otherwise, review of systems are positive for none.   All other systems are reviewed and negative.    PHYSICAL EXAM: VS:  BP  144/92 mmHg  Pulse 117  Ht  (1.6 m)  Wt 185 lb (83.915 kg)  BMI 32.78 kg/m2 , BMI Body mass index is 32.78 kg/(m^2). GEN: Well nourished, well developed, in no acute distress HEENT: normal Neck: no JVD, carotid bruits, or masses Cardiac: Irregularly irregular.; no murmurs, rubs, or gallops,no edema  Respiratory:  clear to auscultation bilaterally, normal work of breathing GI: soft, nontender, nondistended, + BS MS: no deformity or atrophy Skin: warm and dry, no rash Neuro:  Strength and sensation are intact Psych: euthymic mood, full affect   EKG:  EKG is ordered  today. The ekg ordered today demonstrates atrial fibrillation with rapid ventricular response.   Recent Labs: 12/08/2014: ALT 63*; B Natriuretic Peptide 394.4*; BUN 16; Creatinine 0.74; Hemoglobin 13.2; Platelets 235; Potassium 4.1; Sodium 143    Lipid Panel    Component Value Date/Time   CHOL 199 04/23/2013 0445   TRIG 85 04/23/2013 0445   HDL 50 04/23/2013 0445   CHOLHDL 4.0 04/23/2013 0445   VLDL 17 04/23/2013 0445   LDLCALC 132* 04/23/2013 0445      Wt Readings from Last 3 Encounters:  02/24/15 185 lb (83.915 kg)  12/10/14 185 lb (83.915 kg)  12/08/14 185 lb (83.915 kg)         ASSESSMENT AND PLAN:  1. Chronic diastolic heart failure 2. Chronic atrial fibrillation. Chads vasc is 3 for heart failure, age, and female sex.  Two-dimensional echocardiogram on 12/15/14 was normal.The echocardiogram is satisfactory. Ejection fraction is normal at 55-60%. There is mild to moderate mitral regurgitation. The patient should continue on her current medication.  We are increasing her dose of beta blocker because of inadequate heart rate control at current dosage. 3. Hypercholesterolemia  Disposition: Increase metoprolol succinate 250 mg twice a day for rate control.  Get CBC hepatic function panel and basal metabolic panel today.  Recheck in 4 months for office visit and EKG   Current medicines are reviewed at length with the patient today.  The patient does not have concerns regarding medicines.  The following changes have been made:    Labs/ tests ordered today include:   Orders Placed This Encounter  Procedures  . EKG 12-Lead       Signed, Cassell Clement, MD  02/24/2015 12:58 PM    [facility] [address] [place], [place], [place]  [address] Phone: [phone]; Fax: [phone]

## 2015-02-27 ENCOUNTER — Telehealth: Payer: Self-pay | Admitting: Cardiology

## 2015-02-27 NOTE — Telephone Encounter (Signed)
New Message    Patients daughter is calling and would like to know her moms lab results. Please give them a call.  Thanks

## 2015-02-27 NOTE — Telephone Encounter (Signed)
-----   Message from Cassell Clementhomas Brackbill, MD sent at 02/24/2015  5:43 PM EDT ----- Please report.  The blood work is improved.  Liver tests are improved.  Potassium is borderline low so eat plenty of fluids which are high in potassium

## 2015-02-27 NOTE — Telephone Encounter (Signed)
Advised daughter of labs 

## 2015-03-04 ENCOUNTER — Emergency Department (HOSPITAL_COMMUNITY): Payer: Medicare Other

## 2015-03-04 ENCOUNTER — Encounter (HOSPITAL_COMMUNITY): Payer: Self-pay | Admitting: Emergency Medicine

## 2015-03-04 ENCOUNTER — Inpatient Hospital Stay (HOSPITAL_COMMUNITY)
Admission: EM | Admit: 2015-03-04 | Discharge: 2015-03-06 | DRG: 308 | Disposition: A | Discharge status: Home / Self Care | Payer: Medicare Other | Attending: Family Medicine | Admitting: Family Medicine

## 2015-03-04 ENCOUNTER — Other Ambulatory Visit (HOSPITAL_COMMUNITY): Payer: Self-pay

## 2015-03-04 DIAGNOSIS — Z7901 Long term (current) use of anticoagulants: Secondary | ICD-10-CM | POA: Diagnosis not present

## 2015-03-04 DIAGNOSIS — I5032 Chronic diastolic (congestive) heart failure: Secondary | ICD-10-CM | POA: Diagnosis present

## 2015-03-04 DIAGNOSIS — N179 Acute kidney failure, unspecified: Secondary | ICD-10-CM | POA: Diagnosis present

## 2015-03-04 DIAGNOSIS — I509 Heart failure, unspecified: Secondary | ICD-10-CM | POA: Insufficient documentation

## 2015-03-04 DIAGNOSIS — R609 Edema, unspecified: Secondary | ICD-10-CM | POA: Diagnosis not present

## 2015-03-04 DIAGNOSIS — Z91013 Allergy to seafood: Secondary | ICD-10-CM | POA: Diagnosis not present

## 2015-03-04 DIAGNOSIS — I5033 Acute on chronic diastolic (congestive) heart failure: Secondary | ICD-10-CM | POA: Diagnosis present

## 2015-03-04 DIAGNOSIS — I10 Essential (primary) hypertension: Secondary | ICD-10-CM | POA: Diagnosis present

## 2015-03-04 DIAGNOSIS — Z66 Do not resuscitate: Secondary | ICD-10-CM | POA: Diagnosis present

## 2015-03-04 DIAGNOSIS — R0602 Shortness of breath: Secondary | ICD-10-CM | POA: Diagnosis present

## 2015-03-04 DIAGNOSIS — R079 Chest pain, unspecified: Secondary | ICD-10-CM | POA: Diagnosis present

## 2015-03-04 DIAGNOSIS — Z7902 Long term (current) use of antithrombotics/antiplatelets: Secondary | ICD-10-CM

## 2015-03-04 DIAGNOSIS — I4821 Permanent atrial fibrillation: Secondary | ICD-10-CM | POA: Diagnosis present

## 2015-03-04 DIAGNOSIS — I4891 Unspecified atrial fibrillation: Secondary | ICD-10-CM | POA: Diagnosis not present

## 2015-03-04 DIAGNOSIS — Z79899 Other long term (current) drug therapy: Secondary | ICD-10-CM | POA: Diagnosis not present

## 2015-03-04 DIAGNOSIS — I482 Chronic atrial fibrillation: Principal | ICD-10-CM | POA: Diagnosis present

## 2015-03-04 DIAGNOSIS — R0789 Other chest pain: Secondary | ICD-10-CM | POA: Diagnosis not present

## 2015-03-04 DIAGNOSIS — Z8249 Family history of ischemic heart disease and other diseases of the circulatory system: Secondary | ICD-10-CM | POA: Diagnosis not present

## 2015-03-04 DIAGNOSIS — M7989 Other specified soft tissue disorders: Secondary | ICD-10-CM | POA: Diagnosis not present

## 2015-03-04 HISTORY — DX: Heart failure, unspecified: I50.9

## 2015-03-04 HISTORY — DX: Unspecified atrial fibrillation: I48.91

## 2015-03-04 LAB — BASIC METABOLIC PANEL
ANION GAP: 9 (ref 5–15)
BUN: 24 mg/dL — ABNORMAL HIGH (ref 6–23)
CALCIUM: 8.9 mg/dL (ref 8.4–10.5)
CO2: 26 mmol/L (ref 19–32)
CREATININE: 1.21 mg/dL — AB (ref 0.50–1.10)
Chloride: 106 mmol/L (ref 96–112)
GFR calc non Af Amer: 44 mL/min — ABNORMAL LOW (ref >90)
GFR, EST AFRICAN AMERICAN: 51 mL/min — AB (ref >90)
Glucose, Bld: 118 mg/dL — ABNORMAL HIGH (ref 70–99)
Potassium: 4 mmol/L (ref 3.5–5.1)
Sodium: 141 mmol/L (ref 135–145)

## 2015-03-04 LAB — CBC
HCT: 39.1 % (ref 36.0–46.0)
Hemoglobin: 12.2 g/dL (ref 12.0–15.0)
MCH: 27.7 pg (ref 26.0–34.0)
MCHC: 31.2 g/dL (ref 30.0–36.0)
MCV: 88.7 fL (ref 78.0–100.0)
Platelets: 212 10*3/uL (ref 150–400)
RBC: 4.41 MIL/uL (ref 3.87–5.11)
RDW: 14.9 % (ref 11.5–15.5)
WBC: 9.5 10*3/uL (ref 4.0–10.5)

## 2015-03-04 LAB — BRAIN NATRIURETIC PEPTIDE: B NATRIURETIC PEPTIDE 5: 503.7 pg/mL — AB (ref 0.0–100.0)

## 2015-03-04 LAB — I-STAT TROPONIN, ED: Troponin i, poc: 0.01 ng/mL (ref 0.00–0.08)

## 2015-03-04 MED ORDER — DILTIAZEM HCL 25 MG/5ML IV SOLN
5.0000 mg | Freq: Once | INTRAVENOUS | Status: AC
  Administered 2015-03-04: 5 mg via INTRAVENOUS
  Filled 2015-03-04: qty 5

## 2015-03-04 MED ORDER — FUROSEMIDE 10 MG/ML IJ SOLN
40.0000 mg | INTRAMUSCULAR | Status: AC
  Administered 2015-03-04: 40 mg via INTRAVENOUS
  Filled 2015-03-04: qty 4

## 2015-03-04 MED ORDER — DEXTROMETHORPHAN POLISTIREX ER 30 MG/5ML PO SUER
30.0000 mg | Freq: Once | ORAL | Status: AC
  Administered 2015-03-04: 30 mg via ORAL
  Filled 2015-03-04: qty 5

## 2015-03-04 NOTE — ED Notes (Signed)
Chelsea [name] (daughter)  [phone]

## 2015-03-04 NOTE — ED Provider Notes (Addendum)
CSN: [id number]     Arrival date & time 03/04/15  1952 History   First MD Initiated Contact with Patient 03/04/15 2036     Chief Complaint  Patient presents with  . Chest Pain  . Shortness of Breath     (Consider location/radiation/quality/duration/timing/severity/associated sxs/prior Treatment) HPI Comments: Patient is a 74 year old female with a past medical history of atrial fibrillation, hypertension, and CHF who presents with shortness of breath that started 1 week ago. Symptoms started gradually and progressively worsened since the onset. The SOB is worse with exertion. Patient reports she normally walks for 30 minutes per day but for the past week she has been short of breath walking around the house. She has associated central chest tightness that does not radiate, dry cough, and nausea. Patient reports normally having these symptoms when her afib is uncontrolled but this time it feels different. No alleviating factors. Patient takes Eliquis for anticoagulation. Patient reports recently seeing her cardiologist, Dr. Patty SermonsBrackbill, 8 days ago who increased her Metoprolol which has not helped her symptoms.    Past Medical History  Diagnosis Date  . Arthritis   . Hypertension   . CHF (congestive heart failure)    History reviewed. No pertinent past surgical history. Family History  Problem Relation Age of Onset  . Heart failure Father    History  Substance Use Topics  . Smoking status: Never Smoker   . Smokeless tobacco: Never Used  . Alcohol Use: No   OB History    No data available     Review of Systems  Constitutional: Negative for fever, chills and fatigue.  HENT: Negative for trouble swallowing.   Eyes: Negative for visual disturbance.  Respiratory: Positive for cough and shortness of breath.   Cardiovascular: Positive for chest pain. Negative for palpitations.  Gastrointestinal: Positive for nausea. Negative for vomiting, abdominal pain and diarrhea.  Genitourinary:  Negative for dysuria and difficulty urinating.  Musculoskeletal: Negative for arthralgias and neck pain.  Skin: Negative for color change.  Neurological: Negative for dizziness and weakness.  Psychiatric/Behavioral: Negative for dysphoric mood.      Allergies  Shrimp  Home Medications   Prior to Admission medications   Medication Sig Start Date End Date Taking? Authorizing Provider  apixaban (ELIQUIS) 5 MG TABS tablet Take 1 tablet (5 mg total) by mouth 2 (two) times daily. 12/10/14  Yes Cassell Clementhomas Brackbill, MD  furosemide (LASIX) 40 MG tablet Take 1 tablet (40 mg total) by mouth daily. 12/10/14  Yes Cassell Clementhomas Brackbill, MD  lisinopril (PRINIVIL,ZESTRIL) 2.5 MG tablet Take 2.5 mg by mouth daily.   Yes Historical Provider, MD  metoprolol succinate (TOPROL-XL) 50 MG 24 hr tablet Take 1 tablet (50 mg total) by mouth 2 (two) times daily. 02/24/15  Yes Cassell Clementhomas Brackbill, MD   BP 122/86 mmHg  Pulse 56  Temp(Src) 98.6 F (37 C) (Oral)  Resp 23  SpO2 100% Physical Exam  Constitutional: She is oriented to person, place, and time. She appears well-developed and well-nourished. No distress.  HENT:  Head: Normocephalic and atraumatic.  Eyes: Conjunctivae and EOM are normal.  Neck: Normal range of motion.  Cardiovascular: Exam reveals no gallop and no friction rub.   No murmur heard. Tachycardic with irregularly irregular rhythm. No lower extremity edema or calf tenderness bilaterally.   Pulmonary/Chest: Effort normal and breath sounds normal. She has no wheezes. She has no rales. She exhibits no tenderness.  Abdominal: Soft. She exhibits no distension. There is no tenderness. There is  no rebound.  Musculoskeletal: Normal range of motion.  Neurological: She is alert and oriented to person, place, and time. Coordination normal.  Speech is goal-oriented. Moves limbs without ataxia.   Skin: Skin is warm and dry.  Psychiatric: She has a normal mood and affect. Her behavior is normal.  Nursing note and  vitals reviewed.   ED Course  Procedures (including critical care time) Labs Review Labs Reviewed  BASIC METABOLIC PANEL - Abnormal; Notable for the following:    Glucose, Bld 118 (*)    BUN 24 (*)    Creatinine, Ser 1.21 (*)    GFR calc non Af Amer 44 (*)    GFR calc Af Amer 51 (*)    All other components within normal limits  BRAIN NATRIURETIC PEPTIDE - Abnormal; Notable for the following:    B Natriuretic Peptide 503.7 (*)    All other components within normal limits  CBC  I-STAT TROPOININ, ED    Imaging Review Dg Chest 2 View  03/04/2015   CLINICAL DATA:  Left side chest pain, SOB x2 days. Hx of HTN, CHF.Nonsmoker.  EXAM: CHEST  2 VIEW  COMPARISON:  12/08/2014  FINDINGS: Mild to moderate enlargement of the cardiopericardial silhouette. No mediastinal or hilar masses or evidence of adenopathy.  Mild interstitial thickening most evident in the lower lungs and along the fissures. No lung consolidation. No pleural effusion or pneumothorax.  Bony thorax is demineralized but grossly intact.  IMPRESSION: 1. Cardiomegaly and mild interstitial thickening consistent with mild congestive heart failure. No evidence of pneumonia.   Electronically Signed   By: Amie Portland M.D.   On: 03/04/2015 21:00     EKG Interpretation None      EKG - afib, rate 123, normal axis, QRSD 80ms, nonspecific t wave abnormality, similar to prior.    MDM   Final diagnoses:  Atrial fibrillation with RVR  CHF exacerbation    10:05 PM EKG shows afib with RVR. Negative troponin. Patient has a mildly elevated BNP with a chest xray that shows mild CHF. Patient will have cardizem injection and started on a drip. Patient will be admitted.   11:07 PM Patient's rate improved with Cardizem injection. Patient will have  IV lasix. Patient will be admitted to family medicine.   Emilia Beck, PA-C 03/04/15 2308  Blake Divine, MD 03/05/15 0454  Blake Divine, MD 03/05/15 1640

## 2015-03-04 NOTE — ED Notes (Signed)
Pt. reports central chest pain with SOB/ exertional dyspnea  , dry cough and nausea onset last night .

## 2015-03-04 NOTE — H&P (Signed)
Family Medicine Teaching [facility] Admission History and Physical Service Pager: [phone]  Patient name: Chelsea [name] Medical record number: [id number] Date of birth: February 09, 1941 Age: 75 y.o. Gender: female  Primary Care Provider: No PCP Per Patient Consultants: None Code Status: DNR (discussed with patient upon admission)  Chief Complaint: Shortness of breath  Assessment and Plan: Chelsea [name] is a 74 y.o. female presenting with shortness of breath. PMH is significant for HFpEF, Afib, and HTN  Shortness of breath / Chest Pain. Likely combination of afib and mild volume overload (increased LE edema and mild crackles on lung exam, though CXR relatively unremarkable). Also with pleuritic chest pain that seems to be more musculoskeletal in etiology, however will rule out ACS. PE is on the differential, though less likely (on chronic anticoagulation, and Wells score of 1.5). CXR negative for pneumonia. Does have unilateral increased pain and swelling in left ankle, which raises concern for possible L DVT, although still felt to be unlikely. SOB and chest discomfort vague in description and are most likely due to uncontrolled rate with afib. Not clinically volume overloaded, and CXR not convincing for signficant pulmonary edema so doubt strong CHF component at this time. - Admitted to telemetry under attending Dr Gwendolyn Grant - Lasix  IV tonight already given in ER, reassess volume status and symptoms in the morning - Daily weights - Strict I/Os - Trend troponins - EKG in AM - Risk stratification labs: A1C, TSH - F/u D-dimer. If positive, will obtain doppler u/s of L leg. - treat afib rate control as below  Afib. HR into 130s in ED s/p 1 dose of diltiazem. BP stable.  - Continue home eliquis - Increase home metoprolol succinate to  twice daily (safe to increase beta blocker as do NOT feel her presentation is due to acute CHF exacerbation at this time) - monitor on telemetry, may need  to add additional agents for rate control but for now titrate beta blocker  HFpEF.  EF 55-60% in 12/2014. - Trial of diuresis as above   Mild AKI. Cr 1.21 on admission. Cr 0.81 8 days prior to admission. - Hold home lisinopril - Avoid nephrotoxic medications - CTM in setting of diuresis  HTN. At goal. - Hold home lisinopril as above - Increase home metoprolol to  twice daily  FEN/GI: Heart Healthy diet Prophylaxis: Home Eliquis  Disposition: admit to telemetry, dispo pending clinical improvement/diuresis/chest pain rule out  History of Present Illness: Chelsea [name] is a 74 y.o. female presenting with chest pain and dyspnea.  Patient reports that last night she began feeling dyspnea, chest pain, and had trouble sleeping. By the next morning she was feeling well but in the afternoon began to feel worse again. Has had dry cough that is nonproductive and left sided chest pain, also some mid-chest pain. Is not having chest pain at present. Chest pain is worse with the cough. Does not radiate to arm or back. She is not sure how long the pain lasted. Pain not reproducible with palpation. It was not sharp in quality. Has been very sweaty despite having air conditioner on in house.  She has also had sensation of heart beating fast. Feels low energy when this happens. Has trouble walking more than 5 minutes now due to shortness of breath, but is typically able to walk for much longer. Notes increased swelling in left leg/ankle. Right leg without any increased swelling. Sleeps on two pillows at night. No orthopnea. No PND. Has not missed any medication doses.  In the ED, patient had EKG which showed Afib. She was given 1 dose of diltiazem 5mg  IV which improved her heart rate.   Review Of Systems: Per HPI, Otherwise 12 point review of systems was performed and was unremarkable.  Patient Active Problem List   Diagnosis Date Noted  . Essential hypertension 02/24/2015  . Chronic diastolic heart  failure 12/10/2014  . Atrial fibrillation 12/10/2014  . Paroxysmal nocturnal dyspnea 12/10/2014   Past Medical History: Past Medical History  Diagnosis Date  . Arthritis   . Hypertension   . CHF (congestive heart failure)    Past Surgical History: History reviewed. No pertinent past surgical history. Social History: History  Substance Use Topics  . Smoking status: Never Smoker   . Smokeless tobacco: Never Used  . Alcohol Use: No   Additional social history: denies tobacco, alcohol, drug use  Please also refer to relevant sections of EMR.  Family History: Family History  Problem Relation Age of Onset  . Heart failure Father    Allergies and Medications: Allergies  Allergen Reactions  . Shrimp [Shellfish Allergy] Hives   No current facility-administered medications on file prior to encounter.   Current Outpatient Prescriptions on File Prior to Encounter  Medication Sig Dispense Refill  . apixaban (ELIQUIS) 5 MG TABS tablet Take 1 tablet (5 mg total) by mouth 2 (two) times daily. 180 tablet 1  . furosemide (LASIX) 40 MG tablet Take 1 tablet (40 mg total) by mouth daily. 90 tablet 1  . lisinopril (PRINIVIL,ZESTRIL) 2.5 MG tablet Take 2.5 mg by mouth daily.    . metoprolol succinate (TOPROL-XL) 50 MG 24 hr tablet Take 1 tablet (50 mg total) by mouth 2 (two) times daily. 60 tablet 5    Objective: BP 138/76 mmHg  Pulse 108  Temp(Src) 98.6 F (37 C) (Oral)  Resp 16  SpO2 100% Exam: General: Elderly woman in NAD sitting in hospital bed HEENT: EOMI, MMM, thyroid normal Cardiovascular: Irregularly Irregular rhythm, no murmurs appreciated Respiratory: NWOB, Mild bibasilar crackles otherwise CTAB Abdomen: Soft, NT, ND Extremities: trace pitting edema to mid shin in RLE, 1+ to 2+ pitting edema in LLE, Negative Homan's Skin: No rashes Neuro: Alert and conversational. No focal neurological deficits.   Labs and Imaging: CBC BMET   Recent Labs Lab 03/04/15 2010  WBC  9.5  HGB 12.2  HCT 39.1  PLT 212    Recent Labs Lab 03/04/15 2010  NA 141  K 4.0  CL 106  CO2 26  BUN 24*  CREATININE 1.21*  GLUCOSE 118*  CALCIUM 8.9     BNP 503.7 Troponin 0.01  EKG: Afib (HR 123), no ischemic changes  Dg Chest 2 View  03/04/2015   CLINICAL DATA:  Left side chest pain, SOB x2 days. Hx of HTN, CHF.Nonsmoker.  EXAM: CHEST  2 VIEW  COMPARISON:  12/08/2014  FINDINGS: Mild to moderate enlargement of the cardiopericardial silhouette. No mediastinal or hilar masses or evidence of adenopathy.  Mild interstitial thickening most evident in the lower lungs and along the fissures. No lung consolidation. No pleural effusion or pneumothorax.  Bony thorax is demineralized but grossly intact.  IMPRESSION: 1. Cardiomegaly and mild interstitial thickening consistent with mild congestive heart failure. No evidence of pneumonia.   Electronically Signed   By: Amie Portlandavid  Ormond M.D.   On: 03/04/2015 21:00   Ardith Darkaleb M Parker, MD 03/04/2015, 11:06 PM PGY-1, [facility] FPTS Intern pager: [phone], text pages welcome  Upper Level Addendum:  I  have seen and evaluated this patient along with Dr. Jimmey Ralph and reviewed the above note, making necessary revisions in pink.   Levert Feinstein, MD Family Medicine PGY-3

## 2015-03-04 NOTE — Progress Notes (Signed)
2w RN Called for report from ED, [place].

## 2015-03-05 ENCOUNTER — Encounter (HOSPITAL_COMMUNITY): Payer: Self-pay | Admitting: General Practice

## 2015-03-05 DIAGNOSIS — R079 Chest pain, unspecified: Secondary | ICD-10-CM

## 2015-03-05 DIAGNOSIS — I509 Heart failure, unspecified: Secondary | ICD-10-CM

## 2015-03-05 DIAGNOSIS — I4891 Unspecified atrial fibrillation: Secondary | ICD-10-CM

## 2015-03-05 DIAGNOSIS — I5033 Acute on chronic diastolic (congestive) heart failure: Secondary | ICD-10-CM

## 2015-03-05 LAB — BASIC METABOLIC PANEL
Anion gap: 8 (ref 5–15)
BUN: 17 mg/dL (ref 6–23)
CO2: 28 mmol/L (ref 19–32)
Calcium: 9 mg/dL (ref 8.4–10.5)
Chloride: 105 mmol/L (ref 96–112)
Creatinine, Ser: 0.76 mg/dL (ref 0.50–1.10)
GFR calc Af Amer: >90 mL/min (ref >90)
GFR calc non Af Amer: 82 mL/min — ABNORMAL LOW (ref >90)
Glucose, Bld: 112 mg/dL — ABNORMAL HIGH (ref 70–99)
Potassium: 4.1 mmol/L (ref 3.5–5.1)
Sodium: 141 mmol/L (ref 135–145)

## 2015-03-05 LAB — TROPONIN I
Troponin I: <0.03 ng/mL (ref <0.031)
Troponin I: <0.03 ng/mL (ref <0.031)

## 2015-03-05 LAB — TSH: TSH: 2.694 u[IU]/mL (ref 0.350–4.500)

## 2015-03-05 LAB — D-DIMER, QUANTITATIVE (NOT AT ARMC): D DIMER QUANT: 1.1 ug{FEU}/mL — AB (ref 0.00–0.48)

## 2015-03-05 MED ORDER — LISINOPRIL 2.5 MG PO TABS: 2.5000 mg | ORAL_TABLET | Freq: Every day | ORAL | Status: DC

## 2015-03-05 MED ORDER — SODIUM CHLORIDE 0.9 % IJ SOLN: 3.0000 mL | Freq: Two times a day (BID) | INTRAMUSCULAR | Status: DC

## 2015-03-05 MED ORDER — SODIUM CHLORIDE 0.9 % IJ SOLN
3.0000 mL | Freq: Two times a day (BID) | INTRAMUSCULAR | Status: DC
  Administered 2015-03-05 – 2015-03-06 (×4): 3 mL via INTRAVENOUS

## 2015-03-05 MED ORDER — SODIUM CHLORIDE 0.9 % IV SOLN: 250.0000 mL | INTRAVENOUS | Status: DC | PRN

## 2015-03-05 MED ORDER — ACETAMINOPHEN 325 MG PO TABS: 650.0000 mg | ORAL_TABLET | Freq: Four times a day (QID) | ORAL | Status: DC | PRN

## 2015-03-05 MED ORDER — METOPROLOL SUCCINATE ER 50 MG PO TB24
75.0000 mg | ORAL_TABLET | Freq: Two times a day (BID) | ORAL | Status: DC
  Administered 2015-03-05 – 2015-03-06 (×4): 75 mg via ORAL
  Filled 2015-03-05 (×5): qty 1

## 2015-03-05 MED ORDER — FUROSEMIDE 40 MG PO TABS
40.0000 mg | ORAL_TABLET | Freq: Every day | ORAL | Status: DC
  Administered 2015-03-05 – 2015-03-06 (×2): 40 mg via ORAL
  Filled 2015-03-05 (×2): qty 1

## 2015-03-05 MED ORDER — ACETAMINOPHEN 650 MG RE SUPP: 650.0000 mg | Freq: Four times a day (QID) | RECTAL | Status: DC | PRN

## 2015-03-05 MED ORDER — SODIUM CHLORIDE 0.9 % IJ SOLN: 3.0000 mL | INTRAMUSCULAR | Status: DC | PRN

## 2015-03-05 MED ORDER — BENZONATATE 100 MG PO CAPS
100.0000 mg | ORAL_CAPSULE | Freq: Three times a day (TID) | ORAL | Status: DC
  Administered 2015-03-05 – 2015-03-06 (×5): 100 mg via ORAL
  Filled 2015-03-05 (×6): qty 1

## 2015-03-05 MED ORDER — APIXABAN 5 MG PO TABS
5.0000 mg | ORAL_TABLET | Freq: Two times a day (BID) | ORAL | Status: DC
  Administered 2015-03-05 – 2015-03-06 (×4): 5 mg via ORAL
  Filled 2015-03-05 (×5): qty 1

## 2015-03-05 MED ORDER — DILTIAZEM HCL 60 MG PO TABS
60.0000 mg | ORAL_TABLET | Freq: Three times a day (TID) | ORAL | Status: DC
  Administered 2015-03-05 – 2015-03-06 (×4): 60 mg via ORAL
  Filled 2015-03-05 (×6): qty 1

## 2015-03-05 NOTE — Progress Notes (Addendum)
Patient arrived to floor 2w from ED. A&Ox4, speaks english well. Denies chest pain except with cough. Afib on tele 100-140's, V/S 159/74, 100% room air. Patient oriented to room and floor. Patient refusing to remove jewelry or send valuables to security. Refusing to wear non-skid socks, has shoes at bedside. Independent with oob in room. MD in to see patient. Will continue to monitor.

## 2015-03-05 NOTE — Plan of Care (Signed)
Problem: Phase I Progression Outcomes Goal: Anticoagulation Therapy per MD order Outcome: Completed/Met Date Met:  03/05/15 Patient on Eliquis Goal: Heart rate or rhythm control medication Outcome: Progressing On metoprolol at home Goal: Initial discharge plan identified Outcome: Completed/Met Date Met:  03/05/15 home

## 2015-03-05 NOTE — Progress Notes (Signed)
Patient refusing to send belongings including jewelry, pocketbook, cell phone to security. Advised re: belongings policy. Advised to send home with daughter tomorrow. Patient verbalized understanding and wishes to keep all at bedside. Currently still wearing all jewelry except for necklace (5 rings, 10 bracelets/bangles, pair of earrings).

## 2015-03-05 NOTE — Progress Notes (Signed)
D-Dimer elevated 1.10, MD Jimmey RalphParker notified

## 2015-03-05 NOTE — Discharge Summary (Signed)
Family Medicine Teaching [facility] Discharge Summary  Patient name: Chelsea [name] Medical record number: [id number] Date of birth: 1940-11-28 Age: 74 y.o. Gender: female Date of Admission: 03/04/2015  Date of Discharge: 03/06/2015 Admitting Physician: Tobey Grim, MD  Primary Care Provider: Cassell Clement, MD Consultants: Cardiology  Indication for Hospitalization: Shortness of breath  Discharge Diagnoses/Problem List:  Afib, HFpEF, HTN  Disposition: Home  Discharge Condition: Improved  Discharge Exam:  Blood pressure 121/68, pulse 84, temperature 97.7 F (36.5 C), temperature source Oral, resp. rate 18, height  (1.6 m), weight 181 lb (82.1 kg), SpO2 96 %. General: Elderly woman in NAD sitting in hospital bed HEENT: EOMI, MMM Cardiovascular: Irregularly Irregular rhythm, no murmurs appreciated Respiratory: NWOB, Mild bibasilar crackles otherwise CTAB Abdomen: Soft, NT, ND Extremities: trace pitting edema to mid shin in RLE, 1+ pitting edema in LLE Skin: No rashes Neuro: Alert and conversational. No focal neurological deficits.   Brief Hospital Course:  Chelsea [name] is a 74 y.o. Female with history of Afib, HFpEF, and HTN who presented to the ED with shortness of breath and palpitations. In the ED patient was noted in be in afib with RVR into the 130s and was subsequently admitted. Her hospital course by problem is outlined below:  Shortness of breath / Afib / HFpEF. This was felt to be most likely multifactorial in the setting of afib with RVR and mild volume overload (increased LE edema and mild crackles on lung exam).  She was also noted to have mild RLE edema. We performed a venous doppler which was negative. We gave her one additional dose of IV lasix, otherwise she was maintained on her home lasix regimen. We increased the patient's home metoprolol succinate to  twice daily, however she continued to be tachycardic and cardiology was consulted. The patient  was then placed on Cardizem  every 8 hours and had good rate control into the 70s and 80s. She was discharged on diltiazem  daily. This patient's dyspnea improved and she was back to her baseline at the time of discharge.   Chest Pain. Patient described pleuritic chest pain in the setting of a cough for a few days prior to admission. Work up included negative troponin x3, and negative EKGs. She still had pain associated with coughing at discharge, likely musculoskeletal in etiology.  Issues for Follow Up:  1. F/u BP - Patient discharged on an increased dose of metoprolol succinate (  bid) and on diltiazem  daily 2. Patient and family expressed frustration with side effects of beta blocker - could consider exploring other anti-arrhythmics or procedural control of Afib as outpatient.   Significant Procedures: None  Significant Labs and Imaging:   Recent Labs Lab 03/04/15 2010  WBC 9.5  HGB 12.2  HCT 39.1  PLT 212    Recent Labs Lab 03/04/15 2010 03/05/15 1201 03/06/15 1045  NA 141 141 139  K 4.0 4.1 4.0  CL 106 105 102  CO2 GLUCOSE 118* 112* 124*  BUN 24* 17 17  CREATININE 1.21* 0.76 0.92  CALCIUM 8.9 9.0 9.1      Recent Labs Lab 03/05/15 0125 03/05/15 1201  TROPONINI <0.03 <0.03   BNP 503.7 D-dimer 1.10 TSH 2.694  EKG: Afib (HR100), TWI in V3-V6 (present in previous EKGs)  Results/Tests Pending at Time of Discharge: None  Discharge Medications:    Medication List    TAKE these medications        apixaban 5 MG Tabs tablet  Commonly known as:  ELIQUIS  Take 1 tablet (5 mg total) by mouth 2 (two) times daily.     diltiazem 120 MG 24 hr capsule  Commonly known as:  CARDIZEM CD  Take 1 capsule (120 mg total) by mouth daily.     furosemide 40 MG tablet  Commonly known as:  LASIX  Take 1 tablet (40 mg total) by mouth daily.     lisinopril 2.5 MG tablet  Commonly known as:  PRINIVIL,ZESTRIL  Take 2.5 mg by mouth daily.      metoprolol succinate 25 MG 24 hr tablet  Commonly known as:  TOPROL-XL  Take 3 tablets (75 mg total) by mouth 2 (two) times daily.        Discharge Instructions: Please refer to Patient Instructions section of EMR for full details.  Patient was counseled important signs and symptoms that should prompt return to medical care, changes in medications, dietary instructions, activity restrictions, and follow up appointments.   Follow-Up Appointments: Follow-up Information    Follow up with Cassell Clementhomas Brackbill, MD On 03/10/2015.   Specialty:  Cardiology   Why:  11:15   Contact information:   [address] [place] [address] [phone]       Ardith Darkaleb M Donica Derouin, MD 03/07/2015, 1:18 PM PGY-1, [facility]

## 2015-03-05 NOTE — Progress Notes (Signed)
Family Medicine Teaching Service Daily Progress Note Intern Pager: [phone]  Patient name: Chelsea [name] Medical record number: [id number] Date of birth: 1941-07-15 Age: 74 y.o. Gender: female  Primary Care Provider: Cassell Clementhomas Brackbill, MD Consultants: None Code Status: DNR, confirmed with patient on admission  Pt Overview and Major Events to Date:  4/27 - Admitted with dyspnea in afib  Assessment and Plan: Chelsea [name] is a 74 y.o. female presenting with shortness of breath. PMH is significant for HFpEF, Afib, and HTN  Shortness of breath / Chest Pain. Likely combination of afib and mild volume overload (increased LE edema and mild crackles on lung exam, though CXR relatively unremarkable). Also with pleuritic chest pain that seems to be more musculoskeletal in etiology, however will rule out ACS. PE is on the differential (L>R LE edema, positive D-dimer), though less likely (on chronic anticoagulation, and Wells score of 1.5). CXR negative for pneumonia. TSH wnl.  - Continue lasix 40mg  daily - Daily weights - Strict I/Os - Trend troponin until negative x3 - F/u LE dopplers to r/o DVT  Afib. HR into 130s in ED s/p 1 dose of diltiazem. BP stable.  - Continue home eliquis - Increase home metoprolol succinate to 75mg  twice daily  HFpEF. EF 55-60% in 12/2014. - Diuresis as above  AKI. Cr 1.21 on admission. Cr 0.81 8 days prior to admission. - Hold home lisinopril - Avoid nephrotoxic medications - CTM in setting of diuresis  HTN. At goal. - Hold home lisinopril as above - Increase home metoprolol to 75mg  twice daily  FEN/GI: Heart Healthy diet Prophylaxis: Home Eliquis  Disposition: Admitted to telemetry pending above evaluation.   Subjective:  Doing well this morning. Breathing improved. Feels less swelling in leg. Still has cough. No other complaints.   Objective: Temp:  [97.7 F (36.5 C)-98.6 F (37 C)] 97.8 F (36.6 C) (04/28 0454) Pulse Rate:  [56-131] 92 (04/28  0454) Resp:  [16-30] 22 (04/28 0454) BP: (122-159)/(76-115) 138/107 mmHg (04/28 0454) SpO2:  [95 %-100 %] 95 % (04/28 0454) Weight:  [181 lb 8 oz (82.328 kg)] 181 lb 8 oz (82.328 kg) (04/28 0454) Physical Exam: General: Elderly woman in NAD sitting in hospital bed HEENT: EOMI, MMM Cardiovascular: Irregularly Irregular rhythm, no murmurs appreciated Respiratory: NWOB, Mild bibasilar crackles otherwise CTAB Abdomen: Soft, NT, ND Extremities: trace pitting edema to mid shin in RLE, 1+  pitting edema in LLE Skin: No rashes Neuro: Alert and conversational. No focal neurological deficits.   Laboratory:  Recent Labs Lab 03/04/15 2010  WBC 9.5  HGB 12.2  HCT 39.1  PLT 212    Recent Labs Lab 03/04/15 2010  NA 141  K 4.0  CL 106  CO2 26  BUN 24*  CREATININE 1.21*  CALCIUM 8.9  GLUCOSE 118*    BNP 503.7 Troponin 0.03 D-dimer 1.10 TSH 2.694  EKG: Afib (HR100), TWI in V3-V6 (present in previous EKGs)  Ardith Darkaleb M Parker, MD 03/05/2015, 7:17 AM PGY-1, [facility] FPTS Intern pager: [phone], text pages welcome

## 2015-03-05 NOTE — Consult Note (Signed)
CARDIOLOGY CONSULT NOTE   Patient ID: Chelsea [name] MRN: [id number] DOB/AGE: [birth year] 74 y.o.  Admit date: 03/04/2015  Primary Physician   Chelsea Clementhomas Brackbill, MD Primary Cardiologist   Dr Patty SermonsBrackbill Reason for Consultation   Atrial fib, RVR  NWG:NFAOZHPI:Chelsea [name] is a 74 y.o. year old female with a history of chronic afib on Eliquis, d-CHF, and HTN. She was seen in the office 04/19 and was doing well. Her Toprol XL was increased. She was admitted 04/27 with SOB, chest pain and rapid atrial fibrillation. Cardiology was asked to evaluate her.   [name] felt she was doing okay until 3 days ago. She describes a fairly sudden onset of shortness of breath, dry cough, but no fevers or chills. She states that she was short of breath at rest but the shortness of breath worsened when she tried to lie down and go to sleep. 2 days ago her heart rate increased and she felt her heart was pounding, beating very hard and fast. She is aware that she is in atrial fibrillation all the time, but her heart rate does not generally bother her. She describes orthopnea and PND.  After she developed the cough, she developed chest pain, worse with cough or deep inspiration. When her symptoms worsened, she came to the emergency room on 04/27. She received Lasix 40 mg IV last p.m.  She feels improved today, as she is not coughing as much and is having chest pain only with cough or deep inspiration. She is breathing better today. She has been watching what she eats and has lost 4 pounds since 04/19, and is proud of this. She is compliant with her medications and has not missed any doses of Eliquis.   Past Medical History  Diagnosis Date  . Arthritis   . Hypertension   . CHF (congestive heart failure)   . Atrial fibrillation   . Atrial fibrillation with RVR 03/04/2015     Past Surgical History  Procedure Laterality Date  . No past surgeries      Allergies  Allergen Reactions  . Shrimp [Shellfish Allergy]  Hives    I have reviewed the patient's current medications . apixaban  5 mg Oral BID  . benzonatate  100 mg Oral TID  . furosemide  40 mg Oral Daily  . metoprolol succinate  75 mg Oral BID  . sodium chloride  3 mL Intravenous Q12H     sodium chloride, acetaminophen **OR** acetaminophen, sodium chloride  Prior to Admission medications   Medication Sig Start Date End Date Taking? Authorizing Provider  apixaban (ELIQUIS) 5 MG TABS tablet Take 1 tablet (5 mg total) by mouth 2 (two) times daily. 12/10/14  Yes Chelsea Clementhomas Brackbill, MD  furosemide (LASIX) 40 MG tablet Take 1 tablet (40 mg total) by mouth daily. 12/10/14  Yes Chelsea Clementhomas Brackbill, MD  lisinopril (PRINIVIL,ZESTRIL) 2.5 MG tablet Take 2.5 mg by mouth daily.   Yes Historical Provider, MD  metoprolol succinate (TOPROL-XL) 50 MG 24 hr tablet Take 1 tablet (50 mg total) by mouth 2 (two) times daily. 02/24/15  Yes Chelsea Clementhomas Brackbill, MD     History   Social History  . Marital Status: Widowed    Spouse Name: N/A  . Number of Children: N/A  . Years of Education: N/A   Occupational History  . Not on file.   Social History Main Topics  . Smoking status: Never Smoker   . Smokeless tobacco: Never Used  . Alcohol Use: No  .  Drug Use: No  . Sexual Activity: No   Other Topics Concern  . Not on file   Social History Narrative    Family Status  Relation Status Death Age  . Mother Deceased   . Father Deceased    Family History  Problem Relation Age of Onset  . Heart failure Father      ROS:  Full 14 point review of systems complete and found to be negative unless listed above.  Physical Exam: Blood pressure 119/74, pulse 59, temperature 97.9 F (36.6 C), temperature source Oral, resp. rate 22, weight 181 lb 8 oz (82.328 kg), SpO2 97 %.  General: Well developed, well nourished, female in no acute distress Head: Eyes PERRLA, No xanthomas.   Normocephalic and atraumatic, oropharynx without edema or exudate. Dentition: Good Lungs:  Rales bases Heart: Heart irregular rate and rhythm with S1, S2  murmur. pulses are 2+ extrem.   Neck: No carotid bruits. No lymphadenopathy.  JVD mildly elevated. Abdomen: Bowel sounds present, abdomen soft and non-tender without masses or hernias noted. Msk:  No spine or cva tenderness. No weakness, no joint deformities or effusions. Extremities: No clubbing or cyanosis. No edema.  Neuro: Alert and oriented X 3. No focal deficits noted. Psych:  Good affect, responds appropriately Skin: No rashes or lesions noted.  Labs:   Lab Results  Component Value Date   WBC 9.5 03/04/2015   HGB 12.2 03/04/2015   HCT 39.1 03/04/2015   MCV 88.7 03/04/2015   PLT 212 03/04/2015     Recent Labs Lab 03/05/15 1201  NA 141  K 4.1  CL 105  CO2 28  BUN 17  CREATININE 0.76  CALCIUM 9.0  GLUCOSE 112*    Recent Labs  03/05/15 0125 03/05/15 1201  TROPONINI <0.03 <0.03    Recent Labs  03/04/15 2015  TROPIPOC 0.01   B NATRIURETIC PEPTIDE  Date/Time Value Ref Range Status  03/04/2015 08:10 PM 503.7* 0.0 - 100.0 pg/mL Final  12/08/2014 11:40 AM 394.4* 0.0 - 100.0 pg/mL Final   Lab Results  Component Value Date   DDIMER 1.10* 03/05/2015   TSH  Date/Time Value Ref Range Status  03/05/2015 01:25 AM 2.694 0.350 - 4.500 uIU/mL Final    Echo: 12/15/2014 Conclusions - Left ventricle: The cavity size was normal. Wall thickness was normal. Systolic function was normal. The estimated ejection fraction was in the range of 55% to 60%. Wall motion was normal; there were no regional wall motion abnormalities. - Aortic valve: There was trivial regurgitation. - Mitral valve: Mildly to moderately calcified annulus. There was mild to moderate regurgitation. - Left atrium: The atrium was moderately dilated. - Right atrium: The atrium was mildly dilated.  ECG:  03/04/2015 Atrial fibrillation, rapid ventricular response  Radiology:  Dg Chest 2 View 03/04/2015   CLINICAL DATA:  Left  side chest pain, SOB x2 days. Hx of HTN, CHF.Nonsmoker.  EXAM: CHEST  2 VIEW  COMPARISON:  12/08/2014  FINDINGS: Mild to moderate enlargement of the cardiopericardial silhouette. No mediastinal or hilar masses or evidence of adenopathy.  Mild interstitial thickening most evident in the lower lungs and along the fissures. No lung consolidation. No pleural effusion or pneumothorax.  Bony thorax is demineralized but grossly intact.  IMPRESSION: 1. Cardiomegaly and mild interstitial thickening consistent with mild congestive heart failure. No evidence of pneumonia.   Electronically Signed   By: Amie Portland M.D.   On: 03/04/2015 21:00    ASSESSMENT AND PLAN:   The patient  was seen today by Dr. Tresa Endo, the patient evaluated and the data reviewed.  Principal Problem:   CHF exacerbation - Improved with diuresis - Currently back on home dose of Lasix at 40 mg daily - Management per Internal Medicine  Active Problems:   Atrial fibrillation with RVR - Likely secondary to her shortness of breath, improved overnight - Agree with increasing home dose of beta blocker, follow-up with Dr. Patty Sermons as an outpatient - Message has been sent    Chest pain - Has had in the past, pleuritic in nature and improved - Management per Internal Medicine    Chronic anticoagulation - She is compliant with apixaban, continue this  Otherwise, per internal medicine  Signed: Theodore Demark, PA-C 03/05/2015 3:17 PM Beeper [phone]  Co-Sign MD  Patient seen and examined. Agree with assessment and plan. Pleasant 74 yo female originally from [place] who has a h/o permanent AF on Eliquis anticoagulation, HTN, diastolic CHF who has experienced increasing HR over the past several weeks. She recently had her BB dose increased by Dr. Patty Sermons but has not noticed any benefit. She was admitted yesterday with increasing symptoms. Remotely she had been on amiodarone ? If developed LFT elevation on treatment. ECG interpreted by   me reveals AF with RVR and inferolateral ST changes. An echo on 12/15/18-16 revealed an EF 55 - 60% with normal wall motion, MAC with mild-mod MR and mild LA dilatation. At present telemetry reveal AF at 125 despite the increased Toprol at 75 mg bid, which was increased this admission.  Will add cardizem initially at 60 mg every 8 hrs, f/u BP and ultimately plan to titrate as needed and consolidate to CD dosing.  BNP elevated probably due to diastolic heart failure. Troponins are negative.  In future should pursue a noninvasive nuclear study as an outpatient for potential ischemia assessment. Will follow.   Lennette Bihari, MD, [facility] 03/05/2015 4:11 PM

## 2015-03-06 DIAGNOSIS — I10 Essential (primary) hypertension: Secondary | ICD-10-CM

## 2015-03-06 DIAGNOSIS — M7989 Other specified soft tissue disorders: Secondary | ICD-10-CM

## 2015-03-06 DIAGNOSIS — I482 Chronic atrial fibrillation: Principal | ICD-10-CM

## 2015-03-06 DIAGNOSIS — R609 Edema, unspecified: Secondary | ICD-10-CM | POA: Diagnosis present

## 2015-03-06 DIAGNOSIS — R0789 Other chest pain: Secondary | ICD-10-CM

## 2015-03-06 DIAGNOSIS — Z7901 Long term (current) use of anticoagulants: Secondary | ICD-10-CM

## 2015-03-06 DIAGNOSIS — I5032 Chronic diastolic (congestive) heart failure: Secondary | ICD-10-CM

## 2015-03-06 DIAGNOSIS — I509 Heart failure, unspecified: Secondary | ICD-10-CM | POA: Insufficient documentation

## 2015-03-06 DIAGNOSIS — R0602 Shortness of breath: Secondary | ICD-10-CM | POA: Insufficient documentation

## 2015-03-06 LAB — HEMOGLOBIN A1C
HEMOGLOBIN A1C: 6.3 % — AB (ref 4.8–5.6)
Mean Plasma Glucose: 134 mg/dL

## 2015-03-06 LAB — BASIC METABOLIC PANEL
Anion gap: 8 (ref 5–15)
BUN: 17 mg/dL (ref 6–23)
CALCIUM: 9.1 mg/dL (ref 8.4–10.5)
CHLORIDE: 102 mmol/L (ref 96–112)
CO2: 29 mmol/L (ref 19–32)
Creatinine, Ser: 0.92 mg/dL (ref 0.50–1.10)
GFR calc non Af Amer: 61 mL/min — ABNORMAL LOW (ref >90)
GFR, EST AFRICAN AMERICAN: 70 mL/min — AB (ref >90)
GLUCOSE: 124 mg/dL — AB (ref 70–99)
POTASSIUM: 4 mmol/L (ref 3.5–5.1)
SODIUM: 139 mmol/L (ref 135–145)

## 2015-03-06 MED ORDER — DILTIAZEM HCL ER COATED BEADS 120 MG PO CP24: 120.0000 mg | ORAL_CAPSULE | Freq: Every day | ORAL | Status: DC

## 2015-03-06 MED ORDER — METOPROLOL SUCCINATE ER 25 MG PO TB24: 75.0000 mg | ORAL_TABLET | Freq: Two times a day (BID) | ORAL | Status: DC

## 2015-03-06 NOTE — Progress Notes (Signed)
This patients CHA2DS2-VASc Score and unadjusted Ischemic Stroke Rate (% per year) is equal to 4.8 % stroke rate/year from a score of 4  Above score calculated as 1 point each if present [CHF, HTN, DM, Vascular=MI/PAD/Aortic Plaque, Age if 65-74, or Female] Above score calculated as 2 points each if present [Age > 75, or Stroke/TIA/TE]

## 2015-03-06 NOTE — Discharge Instructions (Signed)
You were admitted to the hospital with shortness of breath. This was probably because your heart was beating too fast due to your atrial fibrillation. We started you on a new medication ( Cardizem 120 mg; Take once a day) to help slow down your heart. It is important that you follow up with your primary doctor.

## 2015-03-06 NOTE — Progress Notes (Signed)
Pt is refusing lab draws this morning. Both phlebotomist and RN spoke with pt and she still refuses. Will continue to educate and monitor.

## 2015-03-06 NOTE — Progress Notes (Signed)
    Subjective:  SOB improved  Objective:  Vital Signs in the last 24 hours: Temp:  [97.7 F (36.5 C)-98 F (36.7 C)] 97.7 F (36.5 C) (04/29 0553) Pulse Rate:  [59-84] 84 (04/29 1038) Resp:  [18-22] 18 (04/29 0553) BP: (119-132)/(68-80) 121/68 mmHg (04/29 1038) SpO2:  [93 %-97 %] 96 % (04/29 0553) Weight:  [181 lb (82.1 kg)] 181 lb (82.1 kg) (04/29 0553)  Intake/Output from previous day:  Intake/Output Summary (Last 24 hours) at 03/06/15 1100 Last data filed at 03/06/15 0745  Gross per 24 hour  Intake    885 ml  Output      0 ml  Net    885 ml    Physical Exam: General appearance: alert, cooperative, no distress and mildly obese Neck: no JVD Lungs: clear to auscultation bilaterally Heart: irregularly irregular rhythm Extremities: trace edema Skin: Skin color, texture, turgor normal. No rashes or lesions Neurologic: Grossly normal   Rate: 80-100  Rhythm: atrial fibrillation  Lab Results:  Recent Labs  03/04/15 2010  WBC 9.5  HGB 12.2  PLT 212    Recent Labs  03/04/15 2010 03/05/15 1201  NA 141 141  K 4.0 4.1  CL 106 105  CO2 26 28  GLUCOSE 118* 112*  BUN 24* 17  CREATININE 1.21* 0.76    Recent Labs  03/05/15 0125 03/05/15 1201  TROPONINI <0.03 <0.03   No results for input(s): INR in the last 72 hours.  Scheduled Meds: . apixaban  5 mg Oral BID  . benzonatate  100 mg Oral TID  . diltiazem  60 mg Oral Q8H  . furosemide  40 mg Oral Daily  . metoprolol succinate  75 mg Oral BID  . sodium chloride  3 mL Intravenous Q12H   Continuous Infusions:  PRN Meds:.sodium chloride, acetaminophen **OR** acetaminophen, sodium chloride   Imaging: Imaging results have been reviewed   Assessment/Plan:  74 yo female originally from [place] who has a h/o permanent AF on Eliquis anticoagulation, HTN, and diastolic CHF who has experienced increasing HR over the past several weeks. She recently had her BB dose increased by Dr. Patty SermonsBrackbill but has not  noticed any benefit. She was admitted 03/04/15 with increasing symptoms. Remotely she had been on amiodarone ? If developed LFT elevation on treatment.   An echo on 12/15/2014 revealed an EF 55 - 60% with normal wall motion, MAC with mild-mod MR and mild LA dilatation.   Principal Problem:   Atrial fibrillation with RVR Active Problems:   Acute on chronic diastolic congestive heart failure   Chronic diastolic heart failure   Chest pain   Essential hypertension   Chronic anticoagulation   Edema-dopplers negative for DVT   PLAN: HR and symptoms improved since admission. She had mild CHF on admission that is better after one dose of IV Lasix. Will review with MD- ? Home on Diltiazem 120 mg. She already has a f/u scheduled with Dr Patty SermonsBrackbill on 5/3 at 11:15am.   Corine ShelterLuke Kilroy PA-C 03/06/2015, 11:00 AM   Personally seen and examined. Agree with above. Home on Diltiazem 120 QD makes good sense.  Continue lasix 40 QD Would not be unreasonable to consider outpatient NUC stress. Has appt with Dr. Patty SermonsBrackbill on 5/3.  Will sign off.  Please call if questions.   Donato SchultzSKAINS, MARK, MD

## 2015-03-06 NOTE — Progress Notes (Signed)
Left lower extremity venous duplex completed.  Left:  No evidence of DVT, superficial thrombosis, or Baker's cyst.  Right:  Negative for DVT in the common femoral vein.  

## 2015-03-06 NOTE — Progress Notes (Signed)
Family Medicine Teaching Service Daily Progress Note Intern Pager: [phone]  Patient name: Chelsea [name] Medical record number: [id number] Date of birth: 05/04/1941 Age: 74 y.o. Gender: female  Primary Care Provider: Cassell Clementhomas Brackbill, MD Consultants: None Code Status: DNR, confirmed with patient on admission  Pt Overview and Major Events to Date:  4/27 - Admitted with dyspnea in afib  Assessment and Plan: Chelsea [name] is a 74 y.o. female presenting with shortness of breath. PMH is significant for HFpEF, Afib, and HTN  Shortness of breath / Chest Pain. Likely combination of afib and mild volume overload (increased LE edema and mild crackles on lung exam, though CXR relatively unremarkable). ACS ruled out. SOB Improving. PE is on the differential (L>R LE edema, positive D-dimer), though less likely (on chronic anticoagulation, and Wells score of 1.5). CXR negative for pneumonia. TSH wnl.  - Continue lasix 40mg  daily - Daily weights - Strict I/Os - F/u LE dopplers to r/o DVT - Will need nuclear study as outpatient for potential ischemia assessment  Afib. Rapid ventricular rate into 140s in ED. BP stable. Chads2Vasc score 4. - Cardiology consulted, appreciate assistance.  - Increase home metoprolol succinate to 75mg  twice daily - Started cardizem 60mg  q8hrs per cards recs - will transition to once daily dosing at discharge - Continue home eliquis  HFpEF. EF 55-60% in 12/2014. - Diuresis as above  AKI. Cr 1.21 on admission. Now resolved.  - Hold home lisinopril - Avoid nephrotoxic medications - CTM  HTN. At goal. - Hold home lisinopril as above - Increase home metoprolol to 75mg  twice daily - Cardene 60mg  q8hrs per cardiology - will transition to once daily dosing at discharge.   FEN/GI: Heart Healthy diet Prophylaxis: Home Eliquis  Disposition: Admitted to telemetry pending above evaluation.   Subjective:  Doing well this morning. No SOB or chest pain. LE edema  stable.  Objective: Temp:  [97.7 F (36.5 C)-98 F (36.7 C)] 97.7 F (36.5 C) (04/29 0553) Pulse Rate:  [59-75] 75 (04/29 0553) Resp:  [18-22] 18 (04/29 0553) BP: (119-132)/(74-80) 132/80 mmHg (04/29 0553) SpO2:  [93 %-97 %] 96 % (04/29 0553) Weight:  [181 lb (82.1 kg)] 181 lb (82.1 kg) (04/29 0553) Physical Exam: General: Elderly woman in NAD sitting in hospital bed HEENT: EOMI, MMM Cardiovascular: Irregularly Irregular rhythm, no murmurs appreciated Respiratory: NWOB, Mild bibasilar crackles otherwise CTAB Abdomen: Soft, NT, ND Extremities: trace pitting edema to mid shin in RLE, 1+  pitting edema in LLE Skin: No rashes Neuro: Alert and conversational. No focal neurological deficits.   Laboratory:  Recent Labs Lab 03/04/15 2010 03/05/15 1201  NA 141 141  K 4.0 4.1  CL 106 105  CO2 26 28  BUN 24* 17  CREATININE 1.21* 0.76  CALCIUM 8.9 9.0  GLUCOSE 118* 112*    Chelsea [name] Doretha ImusM Sally Reimers, MD 03/06/2015, 8:29 AM PGY-1, [facility] Family Medicine FPTS Intern pager: [phone], text pages welcome

## 2015-03-06 NOTE — Progress Notes (Signed)
Pt being discharged via wheelchair to home with family. No c/o pain and no respiratory distress. IV removed. Pt tolerated well and catheter intact. No issues at this time. Care plans resolved and discharge education complete. Pt will follow up with PCP. Jillyn HiddenStone,Ishaaq Penna R, RN

## 2015-03-10 ENCOUNTER — Encounter: Payer: Self-pay | Admitting: Cardiology

## 2015-03-10 ENCOUNTER — Ambulatory Visit (INDEPENDENT_AMBULATORY_CARE_PROVIDER_SITE_OTHER): Payer: Medicaid Other | Admitting: Cardiology

## 2015-03-10 VITALS — BP 128/86 | HR 57 | Ht 64.0 in | Wt 184.8 lb

## 2015-03-10 DIAGNOSIS — I5032 Chronic diastolic (congestive) heart failure: Secondary | ICD-10-CM

## 2015-03-10 DIAGNOSIS — R0602 Shortness of breath: Secondary | ICD-10-CM

## 2015-03-10 DIAGNOSIS — I4891 Unspecified atrial fibrillation: Secondary | ICD-10-CM | POA: Diagnosis not present

## 2015-03-10 NOTE — Progress Notes (Signed)
Cardiology Office Note   Date:  03/10/2015   ID:  Chelsea [name], DOB 05-04-1941, MRN [id number]  PCP:  Chelsea [name], Tom, MD  Cardiologist: Cassell Clementhomas Tylen Leverich MD  No chief complaint on file.     History of Present Illness: Chelsea [name] is a 74 y.o. female who presents for a post hospital office visit.  She was recently admitted on 03/04/15 for atrial fibrillation with rapid ventricular response. This pleasant 74 year old woman was initially seen by me for the first time 2 months ago.Chelsea [name]. She was referred from the emergency room. He is being seen because of atrial fibrillation and congestive heart failure. He has a history of atrial fibrillation dating back at least 2 years. Also has a past history of pulmonary edema about 2 years ago. [name] with worsening dyspnea and dry cough, orthopnea, and paroxysmal nocturnal dyspnea and weight gain. She was found to be in congestive heart failure. Chest x-ray on 12/08/14 showed cardiomegaly and mild interstitial pulmonary edema. She was placed on Apixaban, Lasix, and Toprol-XL 25 mg twice a day. Since then she has felt better. She is now sleeping on one pillow. She has had no further chest discomfort. Her weight has come down and her edema has resolved. He had an echocardiogram in June 2014 which showed ejection fraction of 55-60% and she was in atrial fibrillation at that time. We updated her echocardiogram on 12/15/14 it showed an ejection fraction of 55-60% and mild to moderate mitral regurgitation. She has not had any history of embolus or stroke. She does not have any history of hypertension or diabetes. He states that her cholesterol has been described without therapy. However, her last lipid panel was in 2014 and showed an LDL of 132 Her family history reveals that her father died of congestive heart failure. Her mother died of dementia. Since her last saw her she went to [place] to visit  family. While she was there she had a right-sided chest pain and was hospitalized for 3 days. No evidence of myocardial infarction was found. She did not have cardiac catheterization. 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 She was admitted on 03/04/15 for increased shortness of breath and rapid ventricular response to atrial fibrillation.  She responded to the addition of low-dose diltiazem for additional rate slowing. She has felt better since discharge.   Past Medical History  Diagnosis Date  . Arthritis   . Hypertension   . CHF (congestive heart failure)   . Atrial fibrillation   . Atrial fibrillation with RVR 03/04/2015    Past Surgical History  Procedure Laterality Date  . No past surgeries       Current Outpatient Prescriptions  Medication Sig Dispense Refill  . apixaban (ELIQUIS) 5 MG TABS tablet Take 1 tablet (5 mg total) by mouth 2 (two) times daily. 180 tablet 1  . diltiazem (CARDIZEM CD) 120 MG 24 hr capsule Take 1 capsule (120 mg total) by mouth daily. 32 capsule 2  . furosemide (LASIX) 40 MG tablet Take 1 tablet (40 mg total) by mouth daily. 90 tablet 1  . lisinopril (PRINIVIL,ZESTRIL) 2.5 MG tablet Take 2.5 mg by mouth daily.    . metoprolol succinate (TOPROL-XL) 25 MG 24 hr tablet Take 3 tablets (75 mg total) by mouth 2 (two) times daily. 60 tablet 0   No current facility-administered medications for this visit.  Allergies:   Shrimp    Social History:  The patient  reports that she has never smoked. She has never used smokeless tobacco. She reports that she does not drink alcohol or use illicit drugs.   Family History:  The patient's family history includes Heart failure in her father.    ROS:  Please see the history of present illness.   Otherwise, review of systems are positive for none.   All other systems are reviewed and negative.    PHYSICAL EXAM: VS:  BP 128/86 mmHg  Pulse 57  Ht   (1.626 m)  Wt 184 lb 12.8 oz (83.825 kg)  BMI 31.71 kg/m2 , BMI Body mass index is 31.71 kg/(m^2). GEN: Well nourished, well developed, in no acute distress HEENT: normal Neck: no JVD, carotid bruits, or masses Cardiac: Irregularly irregular rhythm.; no murmurs, rubs, or gallops,no edema  Respiratory:  clear to auscultation bilaterally, normal work of breathing GI: soft, nontender, nondistended, + BS MS: no deformity or atrophy Skin: warm and dry, no rash Neuro:  Strength and sensation are intact Psych: euthymic mood, full affect   EKG:  EKG is not ordered today.     Recent Labs: 02/24/2015: ALT 44* 03/04/2015: B Natriuretic Peptide 503.7*; Hemoglobin 12.2; Platelets 212 03/05/2015: TSH 2.694 03/06/2015: BUN 17; Creatinine 0.92; Potassium 4.0; Sodium 139    Lipid Panel    Component Value Date/Time   CHOL 199 04/23/2013 0445   TRIG 85 04/23/2013 0445   HDL 50 04/23/2013 0445   CHOLHDL 4.0 04/23/2013 0445   VLDL 17 04/23/2013 0445   LDLCALC 132* 04/23/2013 0445      Wt Readings from Last 3 Encounters:  03/10/15 184 lb 12.8 oz (83.825 kg)  03/06/15 181 lb (82.1 kg)  02/24/15 185 lb (83.915 kg)        ASSESSMENT AND PLAN:  1. Chronic diastolic heart failure 2. Chronic atrial fibrillation. Chads vasc is 3 for heart failure, age, and female sex. Two-dimensional echocardiogram on 12/15/14 was normal.The echocardiogram is satisfactory. Ejection fraction is normal at 55-60%. There is mild to moderate mitral regurgitation. The patient should continue on her current medication. She is doing better on combination beta blocker and diltiazem CD 3. Hypercholesterolemia   Current medicines are reviewed at length with the patient today.  The patient does not have concerns regarding medicines.  The following changes have been made:  no change  Labs/ tests ordered today include:   Orders Placed This Encounter  Procedures  . Basic metabolic panel  . CBC with  Differential/Platelet    Disposition: Continue current medication.  Recheck in 3 months for office visit CBC and basal metabolic panel. She will also be working to find a PCP Signed, Cassell Clement MD 03/10/2015 7:33 PM    [facility] [address] [place], [place], [place]  [address] Phone: [phone]; Fax: [phone]

## 2015-03-10 NOTE — Patient Instructions (Signed)
Medication Instructions:  Your physician recommends that you continue on your current medications as directed. Please refer to the Current Medication list given to you today.  Labwork: NONE  Testing/Procedures: NONE  Follow-Up: Your physician recommends that you schedule a follow-up appointment in: 3 MONTH OV/EKG/CBC  Any Other Special Instructions Will Be Listed Below (If Applicable). YOU NEED TO GET ESTABLISHED WITH A PRIMARY CARE DOCTOR

## 2015-04-20 ENCOUNTER — Other Ambulatory Visit: Payer: Self-pay | Admitting: Family Medicine

## 2015-05-20 ENCOUNTER — Telehealth: Payer: Self-pay | Admitting: Cardiology

## 2015-05-20 NOTE — Telephone Encounter (Signed)
New Message       Pt's daughter calling wanting to discuss pt's medication dosages and refills to last pt for 3 months while she will be out of the country. Please call back and advise.

## 2015-05-20 NOTE — Telephone Encounter (Signed)
Patient went to [place] and saw a doctor there about having red patches on her chest, arms, and trunk. The doctor in [place] reduced her eliquis to 5 mg once daily to help resolve this issue. Patient said that if she had any trouble with this medication that Dr. Patty SermonsBrackbill would try something else. Patient is leaving to go out of the country on 05/26/15 for 3 months and needs to see Dr. Patty SermonsBrackbill before she leaves. Patient needs enough medication to last her three months. Informed patient that we could send in refills without her coming to see the doctor. Will forward to Dr. Patty SermonsBrackbill and Juliette AlcideMelinda his nurse for further instructions.

## 2015-05-21 MED ORDER — RIVAROXABAN 20 MG PO TABS: 20.0000 mg | ORAL_TABLET | Freq: Every day | ORAL | Status: DC

## 2015-05-21 NOTE — Telephone Encounter (Signed)
Taking the Apixaban only once a day would not be sufficient to protect her from stroke.  We can switch her to Xarelto 20 mg one daily.

## 2015-05-21 NOTE — Telephone Encounter (Signed)
Reviewed medications with patient and daughter Per patient she has only been taking Metoprolol 50 mg twice daily not 75 mg twice daily Will forward to  Dr. Patty SermonsBrackbill for recommendations on dose    Advised daughter of Xarelto 20 mg change

## 2015-05-21 NOTE — Telephone Encounter (Signed)
Continue metoprolol 50 mg BID.

## 2015-05-22 MED ORDER — LISINOPRIL 2.5 MG PO TABS: 2.5000 mg | ORAL_TABLET | Freq: Every day | ORAL | Status: DC

## 2015-05-22 MED ORDER — METOPROLOL SUCCINATE ER 50 MG PO TB24: 50.0000 mg | ORAL_TABLET | Freq: Two times a day (BID) | ORAL | Status: DC

## 2015-05-22 MED ORDER — DILTIAZEM HCL ER COATED BEADS 120 MG PO CP24: 120.0000 mg | ORAL_CAPSULE | Freq: Every day | ORAL | Status: DC

## 2015-05-22 MED ORDER — FUROSEMIDE 40 MG PO TABS: 40.0000 mg | ORAL_TABLET | Freq: Every day | ORAL | Status: DC

## 2015-05-22 NOTE — Telephone Encounter (Signed)
Advised daughter, verbalized understanding   [place] going [place] of the country [place], [facility]

## 2015-05-25 ENCOUNTER — Other Ambulatory Visit: Payer: Self-pay | Admitting: Cardiology

## 2015-06-30 ENCOUNTER — Ambulatory Visit: Payer: Medicaid Other | Admitting: Cardiology

## 2015-07-02 ENCOUNTER — Encounter: Payer: Self-pay | Admitting: Cardiology

## 2015-12-18 ENCOUNTER — Encounter (HOSPITAL_COMMUNITY): Payer: Self-pay

## 2015-12-18 ENCOUNTER — Emergency Department (HOSPITAL_COMMUNITY): Payer: Medicare Other

## 2015-12-18 ENCOUNTER — Emergency Department (HOSPITAL_COMMUNITY)
Admission: EM | Admit: 2015-12-18 | Discharge: 2015-12-18 | Disposition: A | Discharge status: Home / Self Care | Payer: Medicare Other | Attending: Emergency Medicine | Admitting: Emergency Medicine

## 2015-12-18 ENCOUNTER — Emergency Department (HOSPITAL_BASED_OUTPATIENT_CLINIC_OR_DEPARTMENT_OTHER)
Admit: 2015-12-18 | Discharge: 2015-12-18 | Disposition: A | Discharge status: Home / Self Care | Payer: Medicare Other | Attending: Emergency Medicine | Admitting: Emergency Medicine

## 2015-12-18 DIAGNOSIS — M79609 Pain in unspecified limb: Secondary | ICD-10-CM | POA: Diagnosis not present

## 2015-12-18 DIAGNOSIS — Z79899 Other long term (current) drug therapy: Secondary | ICD-10-CM | POA: Insufficient documentation

## 2015-12-18 DIAGNOSIS — I509 Heart failure, unspecified: Secondary | ICD-10-CM | POA: Diagnosis not present

## 2015-12-18 DIAGNOSIS — M25552 Pain in left hip: Secondary | ICD-10-CM | POA: Diagnosis not present

## 2015-12-18 DIAGNOSIS — I10 Essential (primary) hypertension: Secondary | ICD-10-CM | POA: Diagnosis not present

## 2015-12-18 DIAGNOSIS — R52 Pain, unspecified: Secondary | ICD-10-CM

## 2015-12-18 DIAGNOSIS — R531 Weakness: Secondary | ICD-10-CM | POA: Insufficient documentation

## 2015-12-18 LAB — URINALYSIS, ROUTINE W REFLEX MICROSCOPIC
Bilirubin Urine: NEGATIVE
Glucose, UA: NEGATIVE mg/dL
Hgb urine dipstick: NEGATIVE
Ketones, ur: NEGATIVE mg/dL
Leukocytes, UA: NEGATIVE
Nitrite: NEGATIVE
Protein, ur: NEGATIVE mg/dL
SPECIFIC GRAVITY, URINE: 1.008 (ref 1.005–1.030)
pH: 5.5 (ref 5.0–8.0)

## 2015-12-18 MED ORDER — HYDROCODONE-ACETAMINOPHEN 5-325 MG PO TABS
1.0000 | ORAL_TABLET | Freq: Once | ORAL | Status: AC
  Administered 2015-12-18: 1 via ORAL
  Filled 2015-12-18: qty 1

## 2015-12-18 MED ORDER — HYDROCODONE-ACETAMINOPHEN 5-325 MG PO TABS: 1.0000 | ORAL_TABLET | Freq: Four times a day (QID) | ORAL | Status: DC | PRN

## 2015-12-18 NOTE — ED Notes (Signed)
Pt c/o pain and numbness at left leg, left arm and numbness at left hand x 4 days. Pain with walking.

## 2015-12-18 NOTE — ED Notes (Signed)
Pt verbalizes understanding of instruction. 

## 2015-12-18 NOTE — ED Provider Notes (Addendum)
CSN: [id number]     Arrival date & time 12/18/15  0809 History   First MD Initiated Contact with Patient 12/18/15 0820     Chief Complaint  Patient presents with  . Leg Pain  . Extremity Weakness     (Consider location/radiation/quality/duration/timing/severity/associated sxs/prior Treatment) HPI Comments: Patient is a 75 year old female with a history of atrial fibrillation currently on Xarelto, CHF and arthritis who takes tramadol when necessary for pain presents today with 2 days of worsening pain shooting down her left leg. Patient states that she also has some numbness and tingling in the right fingers and intermittent pain in her left shoulder. Patient does admit to having these pains intermittently frequently but the pain in her left leg has never been this bad before. Is so severe now that is difficult for her to walk. She has been taking tramadol at home without improvement in her pain. She denies any falls, heavy lifting, medication changes or urinary symptoms. No fever, cough, chest pain, neck pain, back pain. She states she feels generally weak but she attributes that to the pain.  Patient is a 75 y.o. female presenting with leg pain and extremity weakness. The history is provided by the patient.  Leg Pain Extremity Weakness    Past Medical History  Diagnosis Date  . Arthritis   . Hypertension   . CHF (congestive heart failure) (HCC)   . Atrial fibrillation (HCC)   . Atrial fibrillation with RVR (HCC) 03/04/2015   Past Surgical History  Procedure Laterality Date  . No past surgeries     Family History  Problem Relation Age of Onset  . Heart failure Father    Social History  Substance Use Topics  . Smoking status: Never Smoker   . Smokeless tobacco: Never Used  . Alcohol Use: No   OB History    No data available     Review of Systems  Musculoskeletal: Positive for extremity weakness.  All other systems reviewed and are negative.     Allergies   Shrimp  Home Medications   Prior to Admission medications   Medication Sig Start Date End Date Taking? Authorizing Provider  diltiazem (CARDIZEM CD) 120 MG 24 hr capsule Take 1 capsule (120 mg total) by mouth daily. 05/22/15   Cassell Clement, MD  furosemide (LASIX) 40 MG tablet Take 1 tablet (40 mg total) by mouth daily. 05/22/15   Cassell Clement, MD  lisinopril (PRINIVIL,ZESTRIL) 2.5 MG tablet Take 1 tablet (2.5 mg total) by mouth daily. 05/22/15   Cassell Clement, MD  metoprolol succinate (TOPROL-XL) 50 MG 24 hr tablet Take 1 tablet (50 mg total) by mouth 2 (two) times daily. 05/22/15   Cassell Clement, MD  rivaroxaban (XARELTO) 20 MG TABS tablet Take 1 tablet (20 mg total) by mouth daily with supper. 05/21/15   Cassell Clement, MD   BP 137/93 mmHg  Pulse 93  Temp(Src) 97.3 F (36.3 C) (Oral)  Resp 16  Ht  (1.626 m)  Wt 180 lb (81.647 kg)  BMI 30.88 kg/m2  SpO2 99% Physical Exam  Constitutional: She is oriented to person, place, and time. She appears well-developed and well-nourished. No distress.  HENT:  Head: Normocephalic and atraumatic.  Mouth/Throat: Oropharynx is clear and moist.  Eyes: Conjunctivae and EOM are normal. Pupils are equal, round, and reactive to light.  Neck: Normal range of motion. Neck supple.  Cardiovascular: Normal rate and intact distal pulses.  An irregularly irregular rhythm present.  No murmur heard. Equal pulses  in bilateral upper and lower extremities  Pulmonary/Chest: Effort normal and breath sounds normal. No respiratory distress. She has no wheezes. She has no rales.  Abdominal: Soft. She exhibits no distension. There is no tenderness. There is no rebound and no guarding.  Musculoskeletal: Normal range of motion. She exhibits tenderness. She exhibits no edema.       Left hip: She exhibits tenderness and bony tenderness. She exhibits normal range of motion.       Cervical back: Normal.       Thoracic back: Normal.       Lumbar back:  Normal.       Legs: No swelling or calf pain present  Neurological: She is alert and oriented to person, place, and time.  5/5 strength in bilateral lower extremities. No pronator drift noted on either of the lower extremities. Sensation is intact in the upper and lower extremities.  Skin: Skin is warm and dry. No rash noted. No erythema.  Psychiatric: She has a normal mood and affect. Her behavior is normal.  Nursing note and vitals reviewed.   ED Course  Procedures (including critical care time) Labs Review Labs Reviewed  URINALYSIS, ROUTINE W REFLEX MICROSCOPIC (NOT AT [facility])    Imaging Review Dg Hip Unilat With Pelvis 2-3 Views Left  12/18/2015  CLINICAL DATA:  Sudden onset of left hip pain 2 days ago with increasing symptoms, difficulty sleeping, some associated left leg numbness. EXAM: DG HIP (WITH OR WITHOUT PELVIS) 2-3V LEFT COMPARISON:  None in PACs FINDINGS: The bony pelvis is mildly osteopenic. There is no lytic or blastic lesion. There is no acute or old fracture. The pubic rami appear intact. AP and lateral views of the left hip reveal preservation of the joint space. The articular surfaces of the femoral head and acetabulum remains smoothly rounded. The femoral neck, intertrochanteric, and subtrochanteric regions appear normal. The sacrum and SI joints exhibit no acute abnormalities. There is moderate degenerative disc disease with prominent endplate osteophytes in the lower lumbar spine. IMPRESSION: There is no acute or significant chronic bony abnormality of the left hip or pelvis. There is degenerative change of the lower lumbar spine. Electronically Signed   By: David  [place] M.D.   On: 12/18/2015 09:44   I have personally reviewed and evaluated these images and lab results as part of my medical decision-making.   Expand All Collapse All   *PRELIMINARY RESULTS* Vascular Ultrasound Left lower extremity venous duplex has been completed. Preliminary findings: No evidence of  DVT or baker's cyst.         MDM   Final diagnoses:  Pain  Left hip pain    Patient is a 75 year old female presenting with worsening leg pain over the last 2 days. It sounds like patient has intermittent aches and pains often but the pain in her leg is more severe in the last 2 days affecting her gait. She cannot think of any trauma or exacerbating factor. She's been taking tramadol without improvement. She denies any infectious symptoms and she has no evidence of a septic joint on exam. She has normal strength and sensation with low suspicion for stroke however could possibly be radiculopathy versus acute hip pathology.  UA and films pending. Patient given Vicodin.  9:30 AM On repeat exam pt states 3 weeks ago she flew back from [place] and due to the severe pain in the leg will also get a duplex to r/o DVT  10:19 AM Plain film and UA without acute findings.  Doppler neg for DVT.  Feel most likely pt's pain is related to arthritis pain.  Will give vicodin and encouraged to f/u on Monday/tues if not better.  Gwyneth Sprout, MD 12/18/15 1131  Gwyneth Sprout, MD 12/18/15 1134

## 2015-12-18 NOTE — Progress Notes (Signed)
*  PRELIMINARY RESULTS* Vascular Ultrasound Left lower extremity venous duplex has been completed.  Preliminary findings: No evidence of DVT or baker's cyst.   Farrel Demark, RDMS, RVT  12/18/2015, 10:59 AM

## 2015-12-18 NOTE — ED Notes (Signed)
Pt oob to bsc. Refuses bedpan.

## 2015-12-23 ENCOUNTER — Ambulatory Visit: Payer: Medicaid Other | Admitting: Cardiology

## 2015-12-25 ENCOUNTER — Ambulatory Visit (INDEPENDENT_AMBULATORY_CARE_PROVIDER_SITE_OTHER): Payer: Medicare Other | Admitting: Cardiology

## 2015-12-25 VITALS — BP 110/60 | HR 62 | Ht 64.0 in | Wt 186.8 lb

## 2015-12-25 DIAGNOSIS — I482 Chronic atrial fibrillation, unspecified: Secondary | ICD-10-CM

## 2015-12-25 DIAGNOSIS — I5032 Chronic diastolic (congestive) heart failure: Secondary | ICD-10-CM

## 2015-12-25 DIAGNOSIS — R0602 Shortness of breath: Secondary | ICD-10-CM

## 2015-12-25 MED ORDER — APIXABAN 5 MG PO TABS: 5.0000 mg | ORAL_TABLET | Freq: Two times a day (BID) | ORAL | Status: DC

## 2015-12-25 NOTE — Progress Notes (Signed)
Cardiology Office Note   Date:  12/25/2015   ID:  Meyer, Dockery 27-Oct-1941, MRN [id number]  PCP:  Ronny Flurry, MD  Cardiologist: Cassell Clement MD  Chief Complaint  Patient presents with  . scheduled follow up  . Numbness    left side.       History of Present Illness: Chelsea [name] is a 75 y.o. female who presents for follow-up office visit  .  She was initially referred from the emergency room. She was seen because of atrial fibrillation and congestive heart failure. He has a history of atrial fibrillation dating back at least 2 years. Also has a past history of pulmonary edema about 2 years ago.She has a past history of congestive heart failure with paroxysmal nocturnal dyspnea and peripheral edema.. Chest x-ray on 12/08/14 showed cardiomegaly and mild interstitial pulmonary edema. She was placed on Apixaban, Lasix, and Toprol-XL 25 mg twice a day. Since then she has felt better. She is now sleeping on one pillow. She has had no further chest discomfort. Her weight has come down and her edema has resolved. He had an echocardiogram in June 2014 which showed ejection fraction of 55-60% and she was in atrial fibrillation at that time. We updated her echocardiogram on 12/15/14 it showed an ejection fraction of 55-60% and mild to moderate mitral regurgitation. She has not had any history of embolus or stroke. She does not have any history of hypertension or diabetes. He states that her cholesterol has been described without therapy. However, her last lipid panel was in 2014 and showed an LDL of 132 Her family history reveals that her father died of congestive heart failure. Her mother died of dementia. Since her last saw her she went to [place] to visit family. While she was there she had a right-sided chest pain and was hospitalized for 3 days. No evidence of myocardial infarction was found. She did not have cardiac catheterization. She was told that it was  arthritis of the chest wall and she was placed on Tylenol with Codeine discharge. She was admitted on 03/04/15 for increased shortness of breath and rapid ventricular response to atrial fibrillation. She responded to the addition of low-dose diltiazem for additional rate slowing. She has felt better since discharge. Since we last saw her she returned to her homeland of [place] for a visit.  While she was there she switched herself off of Xarelto onto Apixaban but is taking only 2.5 mg twice a day which is a subtherapeutic dose for her age and weight.  He weighs 84 kg and is 75 years old and has normal renal function.  Therefore we increased her Apixaban to 5 mg twice a day today.  Past Medical History  Diagnosis Date  . Arthritis   . Hypertension   . CHF (congestive heart failure) (HCC)   . Atrial fibrillation (HCC)   . Atrial fibrillation with RVR (HCC) 03/04/2015    Past Surgical History  Procedure Laterality Date  . No past surgeries       Current Outpatient Prescriptions  Medication Sig Dispense Refill  . apixaban (ELIQUIS) 5 MG TABS tablet Take 1 tablet (5 mg total) by mouth 2 (two) times daily. 60 tablet 6  . diltiazem (CARDIZEM CD) 120 MG 24 hr capsule Take 1 capsule (120 mg total) by mouth daily. 90 capsule 3  . furosemide (LASIX) 40 MG tablet Take 1 tablet (40 mg total) by mouth daily. 90 tablet 3  . HYDROcodone-acetaminophen (NORCO/VICODIN) 5-325 MG  tablet Take 1-2 tablets by mouth every 6 (six) hours as needed for severe pain. 20 tablet 0  . lisinopril (PRINIVIL,ZESTRIL) 2.5 MG tablet Take 1 tablet (2.5 mg total) by mouth daily. 90 tablet 3  . metoprolol succinate (TOPROL-XL) 50 MG 24 hr tablet Take 1 tablet (50 mg total) by mouth 2 (two) times daily. 180 tablet 3   No current facility-administered medications for this visit.    Allergies:   Shrimp    Social History:  The patient  reports that she has never smoked. She has never used smokeless tobacco. She reports that  she does not drink alcohol or use illicit drugs.   Family History:  The patient's family history includes Heart failure in her father.    ROS:  Please see the history of present illness.   Otherwise, review of systems are positive for none.   All other systems are reviewed and negative.    PHYSICAL EXAM: VS:  BP 110/60 mmHg  Pulse 62  Ht  (1.626 m)  Wt 186 lb 12.8 oz (84.732 kg)  BMI 32.05 kg/m2 , BMI Body mass index is 32.05 kg/(m^2). GEN: Well nourished, well developed, in no acute distress HEENT: normal Neck: no JVD, carotid bruits, or masses Cardiac: Irregularly irregular no murmurs, rubs, or gallops,no edema  Respiratory:  clear to auscultation bilaterally, normal work of breathing GI: soft, nontender, nondistended, + BS MS: no deformity or atrophy Skin: warm and dry, no rash Neuro:  Strength and sensation are intact Psych: euthymic mood, full affect   EKG:  EKG is ordered today. The ekg ordered today demonstrates atrial fibrillation with controlled ventricular response of 63 bpm.  Nonspecific ST-T wave changes   Recent Labs: 02/24/2015: ALT 44* 03/04/2015: B Natriuretic Peptide 503.7*; Hemoglobin 12.2; Platelets 212 03/05/2015: TSH 2.694 03/06/2015: BUN 17; Creatinine, Ser 0.92; Potassium 4.0; Sodium 139    Lipid Panel    Component Value Date/Time   CHOL 199 04/23/2013 0445   TRIG 85 04/23/2013 0445   HDL 50 04/23/2013 0445   CHOLHDL 4.0 04/23/2013 0445   VLDL 17 04/23/2013 0445   LDLCALC 132* 04/23/2013 0445      Wt Readings from Last 3 Encounters:  12/25/15 186 lb 12.8 oz (84.732 kg)  12/18/15 180 lb (81.647 kg)  03/10/15 184 lb 12.8 oz (83.825 kg)        ASSESSMENT AND PLAN:  1. Chronic diastolic heart failure 2. Chronic atrial fibrillation. Chads vasc is 3 for heart failure, age, and female sex. Two-dimensional echocardiogram on 12/15/14 was normal.The echocardiogram is satisfactory. Ejection fraction is normal at 55-60%. There is mild to  moderate mitral regurgitation. The patient should continue on her current medication. She is doing better on combination beta blocker and diltiazem CD 3. Hypercholesterolemia   Current medicines are reviewed at length with the patient today.  The patient does not have concerns regarding medicines.  The following changes have been made:  Apixaban dose has been increased to 5 mg twice a day  Labs/ tests ordered today include:   Orders Placed This Encounter  Procedures  . EKG 12-Lead     Disposition:   Continue other medicines as same.  Return in 6 months for follow-up office visit and EKG with Dr. Anne Fu  Signed, Cassell Clement MD 12/25/2015 6:54 PM    [facility] [address] [place], [place], [place]  [address] Phone: [phone]; Fax: [phone]

## 2015-12-25 NOTE — Patient Instructions (Signed)
Medication Instructions:  START ELIQUIS 5 MG TWICE A DAY  Labwork: NONE  Testing/Procedures: NONE  Follow-Up: Your physician wants you to follow-up in: 6 MONTH OV/EKG WITH DR Anne Fu You will receive a reminder letter in the mail two months in advance. If you don't receive a letter, please call our office to schedule the follow-up appointment.  If you need a refill on your cardiac medications before your next appointment, please call your pharmacy.

## 2016-04-01 ENCOUNTER — Other Ambulatory Visit: Payer: Self-pay | Admitting: *Deleted

## 2016-04-01 MED ORDER — FUROSEMIDE 40 MG PO TABS: 40.0000 mg | ORAL_TABLET | Freq: Every day | ORAL | Status: DC

## 2016-04-01 MED ORDER — LISINOPRIL 2.5 MG PO TABS: 2.5000 mg | ORAL_TABLET | Freq: Every day | ORAL | Status: DC

## 2016-05-09 ENCOUNTER — Other Ambulatory Visit: Payer: Self-pay | Admitting: *Deleted

## 2016-05-09 MED ORDER — DILTIAZEM HCL ER COATED BEADS 120 MG PO CP24: 120.0000 mg | ORAL_CAPSULE | Freq: Every day | ORAL | Status: DC

## 2016-05-11 ENCOUNTER — Other Ambulatory Visit: Payer: Self-pay

## 2016-05-11 MED ORDER — DILTIAZEM HCL ER COATED BEADS 120 MG PO CP24: 120.0000 mg | ORAL_CAPSULE | Freq: Every day | ORAL | Status: DC

## 2016-09-26 ENCOUNTER — Other Ambulatory Visit: Payer: Self-pay | Admitting: Cardiology

## 2016-11-08 ENCOUNTER — Telehealth: Payer: Self-pay

## 2016-11-08 NOTE — Telephone Encounter (Signed)
Prior auth for Eliquis 5 mg submitted to [facility]. 

## 2016-11-10 ENCOUNTER — Telehealth: Payer: Self-pay | Admitting: *Deleted

## 2016-11-10 NOTE — Telephone Encounter (Signed)
[facility] called to follow up on prior auth for eliquis. I made them aware that it was submitted to [facility] on 11/08/16. They have not heard anything back yet and requested that I send a message to the nurse to follow up on the request. Pharmacist aware that I am sending you a message and also that you are not in the office today.

## 2016-11-11 ENCOUNTER — Telehealth: Payer: Self-pay

## 2016-11-11 NOTE — Telephone Encounter (Signed)
No PA necessary from [facility]. It is on the formulary of covered drugs.

## 2016-11-11 NOTE — Telephone Encounter (Signed)
Per [facility], Eliquis is on the list of preferred drugs, therefore no PA is necessary. Local pharmacy notified.

## 2016-12-08 ENCOUNTER — Telehealth: Payer: Self-pay | Admitting: Cardiology

## 2016-12-08 MED ORDER — DILTIAZEM HCL ER COATED BEADS 120 MG PO CP24
120.0000 mg | ORAL_CAPSULE | Freq: Every day | ORAL | 0 refills | Status: DC
Start: 2016-12-08 — End: 2017-01-18

## 2016-12-08 NOTE — Telephone Encounter (Signed)
New Message   *STAT* If patient is at the pharmacy, call can be transferred to refill team.   1. Which medications need to be refilled? (please list name of each medication and dose if known) Diltiazem 120mg    2. Which pharmacy/location (including street and city if local pharmacy) is medication to be sent to? [facility] [address]   3. Do they need a 30 day or 90 day supply? 90

## 2016-12-23 ENCOUNTER — Encounter: Payer: Self-pay | Admitting: Cardiology

## 2016-12-26 ENCOUNTER — Other Ambulatory Visit: Payer: Self-pay | Admitting: *Deleted

## 2016-12-26 MED ORDER — FUROSEMIDE 40 MG PO TABS: 40.0000 mg | ORAL_TABLET | Freq: Every day | ORAL | 1 refills | Status: DC

## 2016-12-27 ENCOUNTER — Ambulatory Visit: Payer: Medicaid Other | Admitting: Cardiology

## 2017-01-17 NOTE — Progress Notes (Addendum)
Cardiology Office Note    Date:  01/18/2017   ID:  Chelsea HandlerWadia [name], DOB Jul 31, 1941, MRN [id number]  PCP:  Ronny FlurryBrackbill, Tom, MD  Cardiologist:  Dr. Patty SermonsBrackbill --> Dr. Okey DupreEnd  CC: 1 year follow up  History of Present Illness:  Chelsea [name] is a 76 y.o. female with a history of HTN, chronic atrial fibrillation on Eliquis 5mg  BID, chronic diastolic CHF, HLD, mild-mod MR and chronic diastolic CHF who presents to clinic for follow up.   She had an echocardiogram in June 2014 which showed ejection fraction of 55-60% and she was in atrial fibrillation at that time. We updated her echocardiogram on 12/15/14 it showed an ejection fraction of 55-60% and mild to moderate mitral regurgitation. She has not had any history of embolus or stroke.  Today she presents to clinic for follow up. No CP but does have some dyspnea on exertion that has been chronic. She has chronic left lower extremity edema that is unchanged. No orthopnea or PND. She does get dizziness when she sleeps on her back. No syncope. No blood in stool or urine. No palpitations. She does exercise by walking around the block and does some exercises at home. No exertional chest pain. She has been taking Eliquis only once a day because it caused too much bruising. She requests a handicap sticker today.   Past Medical History:  Diagnosis Date  . Arthritis   . Atrial fibrillation (HCC)   . Atrial fibrillation with RVR (HCC) 03/04/2015  . CHF (congestive heart failure) (HCC)   . Hypertension     Past Surgical History:  Procedure Laterality Date  . NO PAST SURGERIES      Current Medications: Outpatient Medications Prior to Visit  Medication Sig Dispense Refill  . HYDROcodone-acetaminophen (NORCO/VICODIN) 5-325 MG tablet Take 1-2 tablets by mouth every 6 (six) hours as needed for severe pain. 20 tablet 0  . apixaban (ELIQUIS) 5 MG TABS tablet Take 1 tablet (5 mg total) by mouth 2 (two) times daily. 60 tablet 6  . diltiazem (CARDIZEM CD) 120 MG  24 hr capsule Take 1 capsule (120 mg total) by mouth daily. 30 capsule 0  . furosemide (LASIX) 40 MG tablet Take 1 tablet (40 mg total) by mouth daily. *Please keep 01/30/17 appointment for further refills* 30 tablet 1  . lisinopril (PRINIVIL,ZESTRIL) 2.5 MG tablet Take 1 tablet (2.5 mg total) by mouth daily. Please call and schedule an appointment with Dr Anne FuSkains 90 tablet 2  . metoprolol succinate (TOPROL-XL) 50 MG 24 hr tablet Take 1 tablet (50 mg total) by mouth 2 (two) times daily. 180 tablet 3   No facility-administered medications prior to visit.      Allergies:   Shrimp [shellfish allergy]   Social History   Social History  . Marital status: Widowed    Spouse name: N/A  . Number of children: N/A  . Years of education: N/A   Social History Main Topics  . Smoking status: Never Smoker  . Smokeless tobacco: Never Used  . Alcohol use No  . Drug use: No  . Sexual activity: No   Other Topics Concern  . None   Social History Narrative  . None     Family History:  The patient's family history includes Heart failure in her father.      ROS:   Please see the history of present illness.    ROS All other systems reviewed and are negative.   PHYSICAL EXAM:   VS:  BP  128/86   Pulse 95   Ht 5\' 2"  (1.575 m)   Wt 192 lb 3.2 oz (87.2 kg)   BMI 35.15 kg/m    GEN: Well nourished, well developed, in no acute distress, obese HEENT: normal  Neck: no JVD, carotid bruits, or masses Cardiac: irreg irreg; soft murmur at apex, rubs, or gallops, 1+ LLE edema Respiratory:  clear to auscultation bilaterally, normal work of breathing GI: soft, nontender, nondistended, + BS MS: no deformity or atrophy  Skin: warm and dry, no rash Neuro:  Alert and Oriented x 3, Strength and sensation are intact Psych: euthymic mood, full affect    Wt Readings from Last 3 Encounters:  01/18/17 192 lb 3.2 oz (87.2 kg)  12/25/15 186 lb 12.8 oz (84.7 kg)  12/18/15 180 lb (81.6 kg)      Studies/Labs  Reviewed:   EKG:  EKG is ordered today.  The ekg ordered today demonstrates chronic atrial fibrillation HR 95  Recent Labs: No results found for requested labs within last 8760 hours.   Lipid Panel    Component Value Date/Time   CHOL 199 04/23/2013 0445   TRIG 85 04/23/2013 0445   HDL 50 04/23/2013 0445   CHOLHDL 4.0 04/23/2013 0445   VLDL 17 04/23/2013 0445   LDLCALC 132 (H) 04/23/2013 0445    Additional studies/ records that were reviewed today include:  2D ECHO: 12/15/2014 LV EF: 55% -  60% Study Conclusion - Left ventricle: The cavity size was normal. Wall thickness was normal. Systolic function was normal. The estimated ejection fraction was in the range of 55% to 60%. Wall motion was normal; there were no regional wall motion abnormalities. - Aortic valve: There was trivial regurgitation. - Mitral valve: Mildly to moderately calcified annulus. There was mild to moderate regurgitation. - Left atrium: The atrium was moderately dilated. - Right atrium: The atrium was mildly dilated.   ASSESSMENT & PLAN:    HTN: BP well controlled   Mild-mod MR: no clinical sx. Continue to follow   Persistent afib: CHADSVASC of at least 4 (CHF, HTN, age, F sex). She has only been taking Eliquis once a day to minimize bruising. I have told her that this will not prevent a CVA. I offered to switch switch her to Xarelto (one time daily drug) which she is okay with. She says that the " medicine will not prevent a stroke, only god can." Continue Toprol and Cardizem for rate control. Will check CBC and BMET given long term OAC use.  Chronic diastolic CHF: appears euvolemic on lasix. Continue current reigmen  She has requested a handicap sticker and I agreed to fill out paperwork.   Medication Adjustments/Labs and Tests Ordered: Current medicines are reviewed at length with the patient today.  Concerns regarding medicines are outlined above.  Medication changes, Labs and Tests  ordered today are listed in the Patient Instructions below. Patient Instructions  Medication Instructions:  Your physician has recommended you make the following change in your medication:  1.  STOP the Eliquis 2.  START the Xarelto 20 mg taking 1 tablet daily   Labwork: TODAY:  CBC & BMET  Testing/Procedures: None ordered  Follow-Up: Your physician wants you to follow-up in: 6 MONTHS WITH DR. END   You will receive a reminder letter in the mail two months in advance. If you don't receive a letter, please call our office to schedule the follow-up appointment.   Any Other Special Instructions Will Be Listed Below (If Applicable).  If you need a refill on your cardiac medications before your next appointment, please call your pharmacy.      Byrd Hesselbach, PA-C  01/18/2017 11:27 AM    [facility] [address] [place], [place], [place]  [address] Phone: [phone]; Fax: [phone]

## 2017-01-18 ENCOUNTER — Ambulatory Visit (INDEPENDENT_AMBULATORY_CARE_PROVIDER_SITE_OTHER): Payer: Medicare Other | Admitting: Physician Assistant

## 2017-01-18 ENCOUNTER — Encounter: Payer: Self-pay | Admitting: Physician Assistant

## 2017-01-18 VITALS — BP 128/86 | HR 95 | Ht 62.0 in | Wt 192.2 lb

## 2017-01-18 DIAGNOSIS — I10 Essential (primary) hypertension: Secondary | ICD-10-CM | POA: Diagnosis not present

## 2017-01-18 DIAGNOSIS — I5032 Chronic diastolic (congestive) heart failure: Secondary | ICD-10-CM | POA: Diagnosis not present

## 2017-01-18 DIAGNOSIS — I482 Chronic atrial fibrillation, unspecified: Secondary | ICD-10-CM

## 2017-01-18 DIAGNOSIS — I340 Nonrheumatic mitral (valve) insufficiency: Secondary | ICD-10-CM | POA: Diagnosis not present

## 2017-01-18 LAB — CBC
Hematocrit: 44.1 % (ref 34.0–46.6)
Hemoglobin: 14.2 g/dL (ref 11.1–15.9)
MCH: 29.5 pg (ref 26.6–33.0)
MCHC: 32.2 g/dL (ref 31.5–35.7)
MCV: 92 fL (ref 79–97)
PLATELETS: 251 10*3/uL (ref 150–379)
RBC: 4.81 x10E6/uL (ref 3.77–5.28)
RDW: 13.5 % (ref 12.3–15.4)
WBC: 10.3 10*3/uL (ref 3.4–10.8)

## 2017-01-18 LAB — BASIC METABOLIC PANEL
BUN / CREAT RATIO: 20 (ref 12–28)
BUN: 14 mg/dL (ref 8–27)
CHLORIDE: 100 mmol/L (ref 96–106)
CO2: 24 mmol/L (ref 18–29)
Calcium: 9 mg/dL (ref 8.7–10.3)
Creatinine, Ser: 0.71 mg/dL (ref 0.57–1.00)
GFR calc non Af Amer: 84 mL/min/{1.73_m2} (ref >59)
GFR, EST AFRICAN AMERICAN: 97 mL/min/{1.73_m2} (ref >59)
Glucose: 87 mg/dL (ref 65–99)
Potassium: 4 mmol/L (ref 3.5–5.2)
SODIUM: 143 mmol/L (ref 134–144)

## 2017-01-18 MED ORDER — RIVAROXABAN 20 MG PO TABS: 20.0000 mg | ORAL_TABLET | Freq: Every day | ORAL | 1 refills | Status: DC

## 2017-01-18 MED ORDER — FUROSEMIDE 40 MG PO TABS: 40.0000 mg | ORAL_TABLET | Freq: Every day | ORAL | 1 refills | Status: DC

## 2017-01-18 MED ORDER — DILTIAZEM HCL ER COATED BEADS 120 MG PO CP24: 120.0000 mg | ORAL_CAPSULE | Freq: Every day | ORAL | 1 refills | Status: DC

## 2017-01-18 MED ORDER — LISINOPRIL 2.5 MG PO TABS: 2.5000 mg | ORAL_TABLET | Freq: Every day | ORAL | 1 refills | Status: DC

## 2017-01-18 MED ORDER — METOPROLOL SUCCINATE ER 50 MG PO TB24: 50.0000 mg | ORAL_TABLET | Freq: Two times a day (BID) | ORAL | 1 refills | Status: DC

## 2017-01-18 NOTE — Patient Instructions (Addendum)
Medication Instructions:  Your physician has recommended you make the following change in your medication:  1.  STOP the Eliquis 2.  START the Xarelto 20 mg taking 1 tablet daily   Labwork: TODAY:  CBC & BMET  Testing/Procedures: None ordered  Follow-Up: Your physician wants you to follow-up in: 6 MONTHS WITH DR. END   You will receive a reminder letter in the mail two months in advance. If you don't receive a letter, please call our office to schedule the follow-up appointment.   Any Other Special Instructions Will Be Listed Below (If Applicable).   If you need a refill on your cardiac medications before your next appointment, please call your pharmacy.

## 2017-01-23 ENCOUNTER — Ambulatory Visit: Payer: Medicaid Other | Admitting: Cardiology

## 2017-01-30 ENCOUNTER — Ambulatory Visit: Payer: Medicaid Other | Admitting: Cardiology

## 2017-04-05 ENCOUNTER — Encounter (HOSPITAL_COMMUNITY): Payer: Self-pay | Admitting: Emergency Medicine

## 2017-04-05 ENCOUNTER — Emergency Department (HOSPITAL_COMMUNITY): Payer: Medicare Other

## 2017-04-05 ENCOUNTER — Emergency Department (HOSPITAL_COMMUNITY)
Admission: EM | Admit: 2017-04-05 | Discharge: 2017-04-05 | Disposition: A | Discharge status: Home / Self Care | Payer: Medicare Other | Attending: Dermatology | Admitting: Dermatology

## 2017-04-05 DIAGNOSIS — R0602 Shortness of breath: Secondary | ICD-10-CM | POA: Insufficient documentation

## 2017-04-05 DIAGNOSIS — Z5321 Procedure and treatment not carried out due to patient leaving prior to being seen by health care provider: Secondary | ICD-10-CM | POA: Insufficient documentation

## 2017-04-05 LAB — CBC
HCT: 43.8 % (ref 36.0–46.0)
HEMOGLOBIN: 13.7 g/dL (ref 12.0–15.0)
MCH: 28.7 pg (ref 26.0–34.0)
MCHC: 31.3 g/dL (ref 30.0–36.0)
MCV: 91.8 fL (ref 78.0–100.0)
Platelets: 275 10*3/uL (ref 150–400)
RBC: 4.77 MIL/uL (ref 3.87–5.11)
RDW: 13.7 % (ref 11.5–15.5)
WBC: 8.7 10*3/uL (ref 4.0–10.5)

## 2017-04-05 LAB — BASIC METABOLIC PANEL
ANION GAP: 7 (ref 5–15)
BUN: 12 mg/dL (ref 6–20)
CALCIUM: 9.2 mg/dL (ref 8.9–10.3)
CO2: 26 mmol/L (ref 22–32)
CREATININE: 0.69 mg/dL (ref 0.44–1.00)
Chloride: 107 mmol/L (ref 101–111)
GFR calc non Af Amer: >60 mL/min (ref >60)
Glucose, Bld: 114 mg/dL — ABNORMAL HIGH (ref 65–99)
Potassium: 4 mmol/L (ref 3.5–5.1)
SODIUM: 140 mmol/L (ref 135–145)

## 2017-04-05 LAB — I-STAT TROPONIN, ED: TROPONIN I, POC: 0.01 ng/mL (ref 0.00–0.08)

## 2017-04-05 NOTE — ED Notes (Signed)
Called name x3 to be roomed. No response.

## 2017-04-05 NOTE — ED Notes (Signed)
Called pt name x3 for vitals. No response. 

## 2017-04-05 NOTE — ED Triage Notes (Signed)
Pt sent to ER from PCP office for atrial fibrillation (not new in onset) and shortness of breath. Pt reports she quit her anticoagulation medication 10 days ago due to hives that began after her doctor switched her anticoagulation medication. Pt has known hx of atrial fibrillation, not in RVR at current time, 82 bpm noted. Pt denies chest pain at this time but reports she has it intermittently. A/o x4. NAD at this time.

## 2017-04-14 ENCOUNTER — Encounter: Payer: Self-pay | Admitting: Cardiology

## 2017-04-14 ENCOUNTER — Telehealth: Payer: Self-pay

## 2017-04-14 ENCOUNTER — Ambulatory Visit (INDEPENDENT_AMBULATORY_CARE_PROVIDER_SITE_OTHER): Payer: Medicare Other | Admitting: Cardiology

## 2017-04-14 VITALS — BP 110/78 | HR 88 | Ht 64.0 in | Wt 188.0 lb

## 2017-04-14 DIAGNOSIS — Z789 Other specified health status: Secondary | ICD-10-CM

## 2017-04-14 MED ORDER — APIXABAN 5 MG PO TABS: 5.0000 mg | ORAL_TABLET | Freq: Two times a day (BID) | ORAL | 11 refills | Status: DC

## 2017-04-14 NOTE — Patient Instructions (Addendum)
Medication Instructions:  Your physician has recommended you make the following change in your medication:  1-START Eliquis 5 mg by mouth twice daily - start taking this evening 2-STOP xarelto   Labwork: NONE  Testing/Procedures: NONE  Follow-Up: Your physician wants you to follow-up in: 3 months with Dr End.    If you need a refill on your cardiac medications before your next appointment, please call your pharmacy.

## 2017-04-14 NOTE — Telephone Encounter (Signed)
Pt walked in to office c/o rash on her body from a medication. She states the rash comes on her back and stomach and she becomes SOB occasionally when the rash comes. She says it is itchy and that it goes away with a hot bath but then comes back. Pt went to PCP for f/u of rash who then sent her to an allergy doctor. Pt states the allergy doctor sent Pt to the ER because the allergy doctor said Pt probably has fluid around her lungs. Pt went to ER and stated she had chest x-rays which did not show any fluid around her lungs. Pt said she waited 5.5 hours in the ER for the doctor and the nurse told her there were still patients in front of her. Pt stated she could not wait any longer and that she had to go home and cook for her husband and family. Pt c/o dry cough as well. Pt did not appear in any acute distress, she denied CP and denied current SOB. The next available appt was offered with Robbie LisBrittainy Simmons, PA today for 11:00 am. I talked with Brittainy and we went and [facility], pharmacist about Pt's medications and the possibility of allergic reactions. Pt does not have an EPI pen. Pt thanked me for offering her the appt today at 11 am. [facility] and is currently waiting in the lobby.

## 2017-04-14 NOTE — Progress Notes (Signed)
04/14/2017 Chelsea [name]   January 24, 1941  [id number]  Primary Physician Patient, No Pcp Per Primary Cardiologist: Dr. Tresa GarterBrackbill>>Dr. End    Reason for Visit/CC: Skin Rash  ? Medication Reaction   HPI:  Chelsea [name] is a 76 y.o. female with a history of HTN, chronic atrial fibrillation on Xarelto, chronic diastolic CHF, HLD, mild-mod MR and chronic diastolic CHF . She is a former patient of Dr. Danielle [name] but will transition to Dr. Okey DupreEnd. She was last seen by Chelsea CrockKathryn Thompson, PA, 01/18/17. At that visit, she was changed from Eliquis to Xarelto. Per review of note, it appears that this was due to patient preference. Pt wanted an once a day medication.   Since then, she has developed issues with itching of the skin and areas of rashes on her torso. She does not have a diffuse rash. She denies any associated wheezing, dyspnea, lip or tongue swelling. There have been no other medication changes. No changes to her diet. No new topical lotions, creams or soaps. No insect bites that she is aware of. The patient seems to think that it is Xarelto and she wants to change back to Eliquis. Her PCP has also referred her to an allergist.    Current Meds  Medication Sig  . diltiazem (CARDIZEM CD) 120 MG 24 hr capsule Take 1 capsule (120 mg total) by mouth daily.  . furosemide (LASIX) 40 MG tablet Take 1 tablet (40 mg total) by mouth daily.  [name]. HYDROcodone-acetaminophen (NORCO/VICODIN) 5-325 MG tablet Take 1-2 tablets by mouth every 6 (six) hours as needed for severe pain.  [name]. lisinopril (PRINIVIL,ZESTRIL) 2.5 MG tablet Take 1 tablet (2.5 mg total) by mouth daily.  . metoprolol succinate (TOPROL-XL) 50 MG 24 hr tablet Take 1 tablet (50 mg total) by mouth 2 (two) times daily.  . [DISCONTINUED] rivaroxaban (XARELTO) 20 MG TABS tablet Take 1 tablet (20 mg total) by mouth daily with supper.   Allergies  Allergen Reactions  . Shrimp [Shellfish Allergy] Hives  . Xarelto [Rivaroxaban] Rash   Past Medical History:    Diagnosis Date  . Arthritis   . Atrial fibrillation (HCC)   . Atrial fibrillation with RVR (HCC) 03/04/2015  . CHF (congestive heart failure) (HCC)   . Hypertension    Family History  Problem Relation Age of Onset  . Heart failure Father    Past Surgical History:  Procedure Laterality Date  . NO PAST SURGERIES     Social History   Social History  . Marital status: Widowed    Spouse name: N/A  . Number of children: N/A  . Years of education: N/A   Occupational History  . Not on file.   Social History Main Topics  . Smoking status: Never Smoker  . Smokeless tobacco: Never Used  . Alcohol use No  . Drug use: No  . Sexual activity: No   Other Topics Concern  . Not on file   Social History Narrative  . No narrative on file     Review of Systems: General: negative for chills, fever, night sweats or weight changes.  Cardiovascular: negative for chest pain, dyspnea on exertion, edema, orthopnea, palpitations, paroxysmal nocturnal dyspnea or shortness of breath Dermatological: negative for rash Respiratory: negative for cough or wheezing Urologic: negative for hematuria Abdominal: negative for nausea, vomiting, diarrhea, bright red blood per rectum, melena, or hematemesis Neurologic: negative for visual changes, syncope, or dizziness All other systems reviewed and are otherwise negative except as noted above.   Physical  Exam:  Blood pressure 110/78, pulse 88, height 5\' 4"  (1.626 m), weight 188 lb (85.3 kg).  General appearance: alert, cooperative and no distress Neck: no carotid bruit and no JVD Lungs: clear to auscultation bilaterally Heart: regular rate and rhythm, S1, S2 normal, no murmur, click, rub or gallop Extremities: extremities normal, atraumatic, no cyanosis or edema Pulses: 2+ and symmetric Skin: small patches of mildly eryhthematous macuopalules on torso  Neurologic: Grossly normal  EKG not performed -- personally reviewed   ASSESSMENT AND PLAN:    1. Xarelto Intolerance: pt complains of issues with itchy skin and areas of rashes on her torso since switching from Eliquis to Xarelto in March 2018. Change was made due to patient preference for once a day medicine. She does not have a diffuse rash. She denies any associated wheezing, dyspnea, lip or tongue swelling. There have been no other medication changes. No changes to her diet. No new topical lotions, creams or soaps. No insect bites that she is aware of. The patient seems to think that it is Xarelto and she wants to change back to Eliquis. Her last dose of Xarelto was last night around 8:00 pm. I've discussed case with our clinical pharmacist. She will go back on 5 mg of Eliquis BID and will start her first dose at 8:00 PM today.   2. Chronic Atrial Fibrillation: on rate control meds, Cardizem and Metoprolol. HR is controlled. She is asymptomatic. BP is stable. Continue DOAC for a/c. Will transition back to Eliquis 5 mg BID as outlined above.    Follow-Up: keep plans to transition care to Dr. Okey Dupre (former Brackbill Pt). F/u in 2 months.   Chelsea [name], M[facility] 04/14/2017 11:48 AM

## 2017-06-08 ENCOUNTER — Ambulatory Visit (INDEPENDENT_AMBULATORY_CARE_PROVIDER_SITE_OTHER): Payer: Medicare Other | Admitting: Cardiology

## 2017-06-08 ENCOUNTER — Encounter: Payer: Self-pay | Admitting: Cardiology

## 2017-06-08 VITALS — BP 128/82 | HR 66 | Ht 64.0 in | Wt 190.8 lb

## 2017-06-08 DIAGNOSIS — R21 Rash and other nonspecific skin eruption: Secondary | ICD-10-CM | POA: Diagnosis not present

## 2017-06-08 DIAGNOSIS — I482 Chronic atrial fibrillation, unspecified: Secondary | ICD-10-CM

## 2017-06-08 DIAGNOSIS — I10 Essential (primary) hypertension: Secondary | ICD-10-CM | POA: Diagnosis not present

## 2017-06-08 MED ORDER — DILTIAZEM HCL ER COATED BEADS 120 MG PO CP24: 120.0000 mg | ORAL_CAPSULE | Freq: Every day | ORAL | 3 refills | Status: DC

## 2017-06-08 MED ORDER — METOPROLOL SUCCINATE ER 50 MG PO TB24: 50.0000 mg | ORAL_TABLET | Freq: Two times a day (BID) | ORAL | 3 refills | Status: DC

## 2017-06-08 MED ORDER — FUROSEMIDE 40 MG PO TABS: 40.0000 mg | ORAL_TABLET | Freq: Every day | ORAL | 3 refills | Status: DC

## 2017-06-08 MED ORDER — DABIGATRAN ETEXILATE MESYLATE 150 MG PO CAPS: 150.0000 mg | ORAL_CAPSULE | Freq: Two times a day (BID) | ORAL | 11 refills | Status: DC

## 2017-06-08 NOTE — Progress Notes (Signed)
Cardiology Office Note:    Date:  06/08/2017   ID:  Chelsea [name], DOB January 06, 1941, MRN [id number]  PCP:  Patient, No Pcp Per  Cardiologist:  Donato SchultzMark Demonta Wombles, MD   Referring MD: No ref. provider found     History of Present Illness:    Chelsea [name] is a 76 y.o. female with history of permanent atrial fibrillation on Xarelto, chronic diastolic heart failure, hyperlipidemia with mild to moderate mitral regurgitation, former patient of Dr. Patty SermonsBrackbill here for follow-up.  Both Xarelto and Eliquis caused rash on her back.  Stopped because of allery for past 3 weeks. Rash on back. She showed me a picture. Went to allergy. Shrimp will cause allergy.   No shortness of breath.    Past Medical History:  Diagnosis Date  . Arthritis   . Atrial fibrillation (HCC)   . Atrial fibrillation with RVR (HCC) 03/04/2015  . CHF (congestive heart failure) (HCC)   . Hypertension     Past Surgical History:  Procedure Laterality Date  . NO PAST SURGERIES      Current Medications: Current Meds  Medication Sig  . diltiazem (CARDIZEM CD) 120 MG 24 hr capsule Take 1 capsule (120 mg total) by mouth daily.  . furosemide (LASIX) 40 MG tablet Take 1 tablet (40 mg total) by mouth daily.  [name]. HYDROcodone-acetaminophen (NORCO/VICODIN) 5-325 MG tablet Take 1-2 tablets by mouth every 6 (six) hours as needed for severe pain.  [name]. lisinopril (PRINIVIL,ZESTRIL) 2.5 MG tablet Take 1 tablet (2.5 mg total) by mouth daily.  . metoprolol succinate (TOPROL-XL) 50 MG 24 hr tablet Take 1 tablet (50 mg total) by mouth 2 (two) times daily.  . [DISCONTINUED] apixaban (ELIQUIS) 5 MG TABS tablet Take 1 tablet (5 mg total) by mouth 2 (two) times daily.  . [DISCONTINUED] diltiazem (CARDIZEM CD) 120 MG 24 hr capsule Take 1 capsule (120 mg total) by mouth daily.  . [DISCONTINUED] furosemide (LASIX) 40 MG tablet Take 1 tablet (40 mg total) by mouth daily.  . [DISCONTINUED] metoprolol succinate (TOPROL-XL) 50 MG 24 hr tablet Take 1 tablet  (50 mg total) by mouth 2 (two) times daily.     Allergies:   Eliquis [apixaban]; Shrimp [shellfish allergy]; and Xarelto [rivaroxaban]   Social History   Social History  . Marital status: Widowed    Spouse name: N/A  . Number of children: N/A  . Years of education: N/A   Social History Main Topics  . Smoking status: Never Smoker  . Smokeless tobacco: Never Used  . Alcohol use No  . Drug use: No  . Sexual activity: No   Other Topics Concern  . None   Social History Narrative  . None     Family History: The patient's family history includes Heart failure in her father. ROS:   Please see the history of present illness.     All other systems reviewed and are negative.  EKGs/Labs/Other Studies Reviewed:    The following studies were reviewed today: Prior office notes reviewed, EKG reviewed, lab work reviewed  EKG: Prior EKG demonstrates atrial fibrillation  Recent Labs: 04/05/2017: BUN 12; Creatinine, Ser 0.69; Hemoglobin 13.7; Platelets 275; Potassium 4.0; Sodium 140  Recent Lipid Panel    Component Value Date/Time   CHOL 199 04/23/2013 0445   TRIG 85 04/23/2013 0445   HDL 50 04/23/2013 0445   CHOLHDL 4.0 04/23/2013 0445   VLDL 17 04/23/2013 0445   LDLCALC 132 (H) 04/23/2013 0445    Physical Exam:  VS:  BP 128/82   Pulse 66   Ht 5\' 4"  (1.626 m)   Wt 190 lb 12.8 oz (86.5 kg)   LMP  (LMP Unknown)   BMI 32.75 kg/m     Wt Readings from Last 3 Encounters:  06/08/17 190 lb 12.8 oz (86.5 kg)  04/14/17 188 lb (85.3 kg)  01/18/17 192 lb 3.2 oz (87.2 kg)     GEN:  Well nourished, well developed in no acute distress HEENT: Normal NECK: No JVD; No carotid bruits LYMPHATICS: No lymphadenopathy CARDIAC: irreg irreg, soft systolic murmur, no rubs, gallops RESPIRATORY:  Clear to auscultation without rales, wheezing or rhonchi  ABDOMEN: Soft, non-tender, non-distended MUSCULOSKELETAL:  No edema; No deformity  SKIN: Warm and dry NEUROLOGIC:  Alert and oriented  x 3 PSYCHIATRIC:  Normal affect   ASSESSMENT:    1. Chronic atrial fibrillation (HCC)   2. Essential hypertension   3. Skin rash    PLAN:    In order of problems listed above:  Permanent atrial fibrillation  - Well rate controlled. Diltiazem and metoprolol  Skin rash  - Seems to have had an allergic reaction to both the Xarelto as well as the Eliquis. She has stopped.  - We will try Pradaxa.  - If necessary, we can use warfarin.  - She has been on diltiazem for several years. It is also interesting to note that she was on Eliquis also from Dr. Yevonne PaxBrackbill's note in 2016.  - She has seen allergist as well.  Mild to moderate mitral regurgitation  - Stable. No significant shortness of breath.  Please see primary writer for other medication refills pertaining to her stomach and eyes.   Medication Adjustments/Labs and Tests Ordered: Current medicines are reviewed at length with the patient today.  Concerns regarding medicines are outlined above.  No orders of the defined types were placed in this encounter.  Meds ordered this encounter  Medications  . dabigatran (PRADAXA) 150 MG CAPS capsule    Sig: Take 1 capsule (150 mg total) by mouth 2 (two) times daily.    Dispense:  60 capsule    Refill:  11  . furosemide (LASIX) 40 MG tablet    Sig: Take 1 tablet (40 mg total) by mouth daily.    Dispense:  90 tablet    Refill:  3  . metoprolol succinate (TOPROL-XL) 50 MG 24 hr tablet    Sig: Take 1 tablet (50 mg total) by mouth 2 (two) times daily.    Dispense:  180 tablet    Refill:  3  . diltiazem (CARDIZEM CD) 120 MG 24 hr capsule    Sig: Take 1 capsule (120 mg total) by mouth daily.    Dispense:  90 capsule    Refill:  3    Signed, Donato SchultzMark Davontae Prusinski, MD  06/08/2017 12:06 PM    [facility]

## 2017-06-08 NOTE — Patient Instructions (Signed)
  Medication Instructions:  Please stop Eliquis. Start Pradaxa 150 mg twice a day. Continue all other medications as listed.  Follow-Up: Follow up in 6 months with [facility] Simmons, PA.  You will receive a letter in the mail 2 months before you are due.  Please call us when you receive this letter to schedule your follow up appointment.  If you need a refill on your cardiac medications before your next appointment, please call your pharmacy.  Thank you for choosing [facility]!!

## 2017-06-09 ENCOUNTER — Telehealth: Payer: Self-pay | Admitting: Cardiology

## 2017-06-09 NOTE — Telephone Encounter (Signed)
Dilt can increase the effect of Pradaxa.  Dr Anne FuSkains is aware of this.  Called pharmacy back and the pharmacist said she's not sure why anyone called about this as it is a low risk interaction.

## 2017-06-09 NOTE — Telephone Encounter (Signed)
New message      Pt c/o medication issue:  1. Name of Medication: pradaxa and diltiazem 2. How are you currently taking this medication (dosage and times per day)?  pradaxa 150mg  and diltiazem 120mg  3. Are you having a reaction (difficulty breathing--STAT)?  no 4. What is your medication issue?  Calling to talk to the nurse regarding drug interaction

## 2017-07-17 ENCOUNTER — Ambulatory Visit: Payer: Medicare Other | Admitting: Internal Medicine

## 2017-11-09 ENCOUNTER — Ambulatory Visit: Payer: Medicare Other | Attending: Orthopaedic Surgery | Admitting: Physical Therapy

## 2017-11-09 ENCOUNTER — Encounter: Payer: Self-pay | Admitting: Physical Therapy

## 2017-11-09 DIAGNOSIS — M6281 Muscle weakness (generalized): Secondary | ICD-10-CM | POA: Diagnosis present

## 2017-11-09 DIAGNOSIS — R262 Difficulty in walking, not elsewhere classified: Secondary | ICD-10-CM

## 2017-11-09 DIAGNOSIS — M5441 Lumbago with sciatica, right side: Secondary | ICD-10-CM | POA: Diagnosis present

## 2017-11-09 DIAGNOSIS — M5442 Lumbago with sciatica, left side: Secondary | ICD-10-CM | POA: Diagnosis not present

## 2017-11-09 NOTE — Therapy (Signed)
[facility] [address], [address] [place], [place], [address] Phone: [phone]   Fax:  [phone]  Physical Therapy Evaluation  Patient Details  Name: Chelsea [name] MRN: [id number] Date of Birth: 12-10-1940 Referring Provider: Dr. Benedict Needy   Encounter Date: 11/09/2017  PT End of Session - 11/09/17 1307    Visit Number  1    Date for PT Re-Evaluation  01/04/18    Authorization Type  Medicare    PT Start Time  1230    PT Stop Time  1320    PT Time Calculation (min)  50 min    Activity Tolerance  Patient limited by pain       Past Medical History:  Diagnosis Date  . Arthritis   . Atrial fibrillation (HCC)   . Atrial fibrillation with RVR (HCC) 03/04/2015  . CHF (congestive heart failure) (HCC)   . Hypertension     Past Surgical History:  Procedure Laterality Date  . NO PAST SURGERIES      There were no vitals filed for this visit.   Subjective Assessment - 11/09/17 1236    Subjective  Fell 2 months ago onto her back and has back pain as a result.  Now can't walk or stand like she used to.  Both legs will feel weak      Pertinent History  HTN;  heart medicine; blood thinner    Limitations  Walking;Standing    How long can you sit comfortably?  1-2 hours    How long can you stand comfortably?  just a few minutes     How long can you walk comfortably?  200 feet slowly    Diagnostic tests  she states her doctor saw "infection"      Patient Stated Goals  walk further without pain used to do 1/2 hour or around the block every day    Currently in Pain?  Yes    Pain Score  5     Pain Location  Back    Pain Orientation  Lower;Right;Left right > left    Pain Type  Acute pain    Pain Frequency  Constant    Aggravating Factors   walking, standing     Pain Relieving Factors  medicine 3x/day; hot shower         [facility] PT Assessment - 11/09/17 0001      Assessment   Medical Diagnosis  neurogenic claudication  and lumbar scoliosis    Referring Provider  Dr. Benedict Needy    Onset Date/Surgical Date  -- 2 months    Next MD Visit  12/18/17    Prior Therapy  no      Precautions   Precautions  None      Restrictions   Weight Bearing Restrictions  No      Balance Screen   Has the patient fallen in the past 6 months  Yes    How many times?  1    Has the patient had a decrease in activity level because of a fear of falling?   No    Is the patient reluctant to leave their home because of a fear of falling?   No      Home Environment   Living Environment  Private residence    Living Arrangements  Children    Available Help at Discharge  Family    Type of Home  House    Home Access  Stairs to enter  Entrance Stairs-Number of Steps  3    Home Layout  Two level;Able to live on main level with bedroom/bathroom      Prior Function   Level of Independence  Independent with basic ADLs    Vocation  Unemployed    Leisure  watch TV      Observation/Other Assessments   Focus on Therapeutic Outcomes (FOTO)   54% limitation       Posture/Postural Control   Posture/Postural Control  Postural limitations    Postural Limitations  Decreased lumbar lordosis      AROM   AROM Assessment Site  -- no clear flexion directional preference;does not lie prone    Lumbar Flexion  60 pain    Lumbar Extension  10 pain    Lumbar - Right Side Bend  30    Lumbar - Left Side Bend  30      Strength   Strength Assessment Site  -- decreased activation of transverse abdominus    Right/Left Hip  Right;Left    Right Hip ABduction  4-/5    Left Hip ABduction  4/5    Lumbar Flexion  4-/5    Lumbar Extension  4-/5      Flexibility   Soft Tissue Assessment /Muscle Length  yes    Hamstrings  normal      Palpation   Palpation comment  no tender points      Slump test   Findings  Positive    Side  Right      Straight Leg Raise   Findings  Negative      Bed Mobility   Bed Mobility  Rolling Right;Rolling  Left;Left Sidelying to Sit all movements are painful             Objective measurements completed on examination: See above findings.      [facility] Adult PT Treatment/Exercise - 11/09/17 0001      Moist Heat Therapy   Number Minutes Moist Heat  12 Minutes    Moist Heat Location  Lumbar Spine      Electrical Stimulation   Electrical Stimulation Location  lumbar    Electrical Stimulation Action  IFC    Electrical Stimulation Parameters  6 ma 12 min    Electrical Stimulation Goals  Pain               PT Short Term Goals - 11/09/17 2005      PT SHORT TERM GOAL #1   Title  The patient will demonstrate knowledge of basic self care strategies and exercises for mobility and pain control      Time  4    Period  Weeks    Status  New    Target Date  12/07/17      PT SHORT TERM GOAL #2   Title  The patient will report a 25% improvement in pain with standing and walking    Time  4    Period  Weeks    Status  New      PT SHORT TERM GOAL #3   Title  The patient will be able to walk 240 feet with minimal increase in back pain and LE weakness    Time  4    Period  Weeks    Status  New        PT Long Term Goals - 11/09/17 2008      PT LONG TERM GOAL #1   Title  The patient will be independent  in safe self progression of HEP     Time  8    Period  Weeks    Status  New    Target Date  01/04/18      PT LONG TERM GOAL #2   Title  The patient will report a 50% improvement in pain and LE weakness with standing and walking    Time  8    Period  Weeks    Status  New      PT LONG TERM GOAL #3   Title  The patient will have improved right hip abduction and lumbo/pelvic core strength to grossly 4 to 4+/5 needed for standing and walking longer periods of time.     Time  8    Period  Weeks    Status  New      PT LONG TERM GOAL #4   Title  The patient will be able to walk 480 feet with minimal LBP and LE weakness     Time  8    Period  Weeks    Status  New      PT  LONG TERM GOAL #5   Title  FOTO functional outcome score improved from 54% limitation to 37% indicating improved function with less pain    Time  8    Period  Weeks    Status  New             Plan - 11/09/17 1951    Clinical Impression Statement  The patient is a 77 year old female who reports she fell onto her back 2 months ago resulting in bilateral (right worse than left) back pain.  She reports that with standing and walking her legs will feel weak.  She is unable to take walks around the block as she did before her fall.  Decreased lumbar ROM and painful  in all planes.  No clear directional preference.  She has pain with turning on the treatment table.  No major tender points indentified.  + slump test.  Decreased right hip abduction muscle strength.  She would benefit from PT to address these deficits in order to return to her previous functional level.      History and Personal Factors relevant to plan of care:  mild language barrier; comorbidities including CHF, HTN, A-fib; advance age    Clinical Presentation  Evolving    Clinical Presentation due to:  worsening of symptoms with bil LEs affected    Clinical Decision Making  Moderate    Rehab Potential  Good    Clinical Impairments Affecting Rehab Potential  mild language barrier    PT Frequency  2x / week    PT Duration  8 weeks    PT Treatment/Interventions  ADLs/Self Care Home Management;Neuromuscular re-education;Dry needling;Electrical Stimulation;Moist Heat;Traction;Therapeutic exercise;Therapeutic activities;Patient/family education;Manual techniques;Taping;Ultrasound    PT Next Visit Plan  assess response to electrical stimulation/heat for pain control on evaluation;  give initial HEP;   manual therapy including right hip joint mobs and soft tissue work;  try Nu-Step;  neutral spine  low level exercise unless clear flexion preference     Consulted and Agree with Plan of Care  Patient       Patient will benefit from  skilled therapeutic intervention in order to improve the following deficits and impairments:  Pain, Postural dysfunction, Decreased activity tolerance, Decreased range of motion, Decreased strength, Difficulty walking  Visit Diagnosis: Acute bilateral low back pain with bilateral sciatica - Plan: PT plan  of care cert/re-cert  Muscle weakness (generalized) - Plan: PT plan of care cert/re-cert  Difficulty in walking, not elsewhere classified - Plan: PT plan of care cert/re-cert     Problem List Patient Active Problem List   Diagnosis Date Noted  . Chronic anticoagulation 03/06/2015  . Edema-dopplers negative for DVT 03/06/2015  . CHF exacerbation (HCC)   . SOB (shortness of breath)   . Chest pain   . Acute on chronic diastolic congestive heart failure (HCC)   . Atrial fibrillation with RVR (HCC) 03/04/2015  . Essential hypertension 02/24/2015  . Chronic diastolic heart failure (HCC) 12/10/2014  . Permanent atrial fibrillation (HCC) 12/10/2014  . Paroxysmal nocturnal dyspnea 12/10/2014   Lavinia Sharps, PT 11/09/17 8:18 PM Phone: [phone] Fax: [phone]  Vivien Presto 11/09/2017, 8:18 PM  [facility] [address] [place], [place], [address] Phone: [phone]   Fax:  [phone]  Name: Chelsea [name] MRN: [id number] Date of Birth: [birth year]

## 2017-11-14 ENCOUNTER — Ambulatory Visit: Payer: Medicare Other | Admitting: Physical Therapy

## 2017-11-14 ENCOUNTER — Telehealth: Payer: Self-pay | Admitting: Physical Therapy

## 2017-11-14 NOTE — Telephone Encounter (Signed)
Attempted to call patient secondary to no-show but the phone number listed was invalid.

## 2017-11-15 ENCOUNTER — Ambulatory Visit: Payer: Medicare Other | Admitting: Physical Therapy

## 2017-11-16 ENCOUNTER — Ambulatory Visit: Payer: Medicare Other

## 2017-11-16 DIAGNOSIS — R262 Difficulty in walking, not elsewhere classified: Secondary | ICD-10-CM

## 2017-11-16 DIAGNOSIS — M6281 Muscle weakness (generalized): Secondary | ICD-10-CM

## 2017-11-16 DIAGNOSIS — M5442 Lumbago with sciatica, left side: Secondary | ICD-10-CM | POA: Diagnosis not present

## 2017-11-16 DIAGNOSIS — M5441 Lumbago with sciatica, right side: Secondary | ICD-10-CM

## 2017-11-16 NOTE — Patient Instructions (Addendum)
Perform all exercises below:  Hold _20___ seconds. Repeat _3___ times.  Do __3__ sessions per day. CAUTION: Movement should be gentle, steady and slow.  Knee to Chest  Lying supine, bend involved knee to chest. Perform with each leg.   Lumbar Rotation: Caudal - Bilateral (Supine)  Feet and knees together, arms outstretched, rotate knees left, turning head in opposite direction, until stretch is felt.      HIP: Hamstrings - Short Sitting   Rest leg on raised surface. Keep knee straight. Lift chest.   [facility] [address], [address] [place], [place] [address] Phone # [phone] Fax [phone] 

## 2017-11-16 NOTE — Therapy (Signed)
[facility] [address] [place], [place], [address] Phone: [phone]   Fax:  [phone]  Physical Therapy Treatment  Patient Details  Name: Chelsea [name] MRN: [id number] Date of Birth: 1941-06-19 Referring Provider: Dr. Benedict Needy   Encounter Date: 11/16/2017  PT End of Session - 11/16/17 1259    Visit Number  2    Date for PT Re-Evaluation  01/04/18    Authorization Type  Medicare    PT Start Time  1230    PT Stop Time  1301    PT Time Calculation (min)  31 min    Activity Tolerance  Patient tolerated treatment well;Patient limited by fatigue pt had a persistent cough and session ended early    Behavior During Therapy  [facility] for tasks assessed/performed       Past Medical History:  Diagnosis Date  . Arthritis   . Atrial fibrillation (HCC)   . Atrial fibrillation with RVR (HCC) 03/04/2015  . CHF (congestive heart failure) (HCC)   . Hypertension     Past Surgical History:  Procedure Laterality Date  . NO PAST SURGERIES      There were no vitals filed for this visit.  Subjective Assessment - 11/16/17 1232    Subjective  I have been taking medication for my pain.  My friend bought me a TENs unit and I have been doing it for pain.    Currently in Pain?  Yes    Pain Score  10-Worst pain ever    Pain Location  Back    Pain Orientation  Right;Left    Pain Descriptors / Indicators  Stabbing;Sharp    Pain Type  Acute pain    Pain Onset  More than a month ago    Pain Frequency  Constant    Aggravating Factors   standing and walking    Pain Relieving Factors  medicaine 3x/day, hot shower                      [facility] Adult PT Treatment/Exercise - 11/16/17 0001      Exercises   Exercises  Knee/Hip;Lumbar      Lumbar Exercises: Stretches   Active Hamstring Stretch  Left;Right;3 reps;20 seconds    Single Knee to Chest Stretch  Left;Right;3 reps;20 seconds    Lower Trunk Rotation  3  reps;20 seconds      Lumbar Exercises: Aerobic   Nustep  Level 1x 5 minutes PT present to discuss progress. Fatigue and asked to stop       Lumbar Exercises: Sidelying   Clam  Both;20 reps      Moist Heat Therapy   Number Minutes Moist Heat  20 Minutes during exercise on mat    Moist Heat Location  Lumbar Spine             PT Education - 11/16/17 1247    Education provided  Yes    Education Details  hip/lumbar flexibility    Person(s) Educated  Patient    Methods  Explanation;Demonstration;Handout    Comprehension  Verbalized understanding;Returned demonstration;Need further instruction;Verbal cues required;Tactile cues required       PT Short Term Goals - 11/09/17 2005      PT SHORT TERM GOAL #1   Title  The patient will demonstrate knowledge of basic self care strategies and exercises for mobility and pain control      Time  4    Period  Weeks  Status  New    Target Date  12/07/17      PT SHORT TERM GOAL #2   Title  The patient will report a 25% improvement in pain with standing and walking    Time  4    Period  Weeks    Status  New      PT SHORT TERM GOAL #3   Title  The patient will be able to walk 240 feet with minimal increase in back pain and LE weakness    Time  4    Period  Weeks    Status  New        PT Long Term Goals - 11/09/17 2008      PT LONG TERM GOAL #1   Title  The patient will be independent in safe self progression of HEP     Time  8    Period  Weeks    Status  New    Target Date  01/04/18      PT LONG TERM GOAL #2   Title  The patient will report a 50% improvement in pain and LE weakness with standing and walking    Time  8    Period  Weeks    Status  New      PT LONG TERM GOAL #3   Title  The patient will have improved right hip abduction and lumbo/pelvic core strength to grossly 4 to 4+/5 needed for standing and walking longer periods of time.     Time  8    Period  Weeks    Status  New      PT LONG TERM GOAL #4    Title  The patient will be able to walk 480 feet with minimal LBP and LE weakness     Time  8    Period  Weeks    Status  New      PT LONG TERM GOAL #5   Title  FOTO functional outcome score improved from 54% limitation to 37% indicating improved function with less pain    Time  8    Period  Weeks    Status  New            Plan - 11/16/17 1240    Clinical Impression Statement  Pt with only 1 session today after evaluation.  PT initiated HEP today for gentle flexibility.  Pt has a home TENs for pain management now.  Pt requires max verbal, demo and tactile cues for for techinque with exercise in the clinic.  Pt reported pain as 10/10 today and was able to tolerate all exercises in the clinic and demonstrated fluid mobility with transitions.  Pt will continue to benefit from skilled PT for progression of flexibility, core strength and manual therapy/modalities as needed.      Rehab Potential  Good    Clinical Impairments Affecting Rehab Potential  mild language barrier    PT Frequency  2x / week    PT Duration  8 weeks    PT Treatment/Interventions  ADLs/Self Care Home Management;Neuromuscular re-education;Dry needling;Electrical Stimulation;Moist Heat;Traction;Therapeutic exercise;Therapeutic activities;Patient/family education;Manual techniques;Taping;Ultrasound    Recommended Other Services  initial plan of care is signed    Consulted and Agree with Plan of Care  Patient       Patient will benefit from skilled therapeutic intervention in order to improve the following deficits and impairments:  Pain, Postural dysfunction, Decreased activity tolerance, Decreased range of motion, Decreased strength, Difficulty walking  Visit Diagnosis: Acute bilateral low back pain with bilateral sciatica  Muscle weakness (generalized)  Difficulty in walking, not elsewhere classified     Problem List Patient Active Problem List   Diagnosis Date Noted  . Chronic anticoagulation 03/06/2015   . Edema-dopplers negative for DVT 03/06/2015  . CHF exacerbation (HCC)   . SOB (shortness of breath)   . Chest pain   . Acute on chronic diastolic congestive heart failure (HCC)   . Atrial fibrillation with RVR (HCC) 03/04/2015  . Essential hypertension 02/24/2015  . Chronic diastolic heart failure (HCC) 12/10/2014  . Permanent atrial fibrillation (HCC) 12/10/2014  . Paroxysmal nocturnal dyspnea 12/10/2014     Lorrene ReidKelly Takacs, PT 11/16/17 1:01 PM  [facility] [address], [address] [place], [place], [address] Phone: [phone]   Fax:  [phone]  Name: Julianne HandlerWadia Cudmore MRN: [id number] Date of Birth: 07-11-1941

## 2017-11-21 ENCOUNTER — Ambulatory Visit: Payer: Medicare Other

## 2017-11-21 DIAGNOSIS — R262 Difficulty in walking, not elsewhere classified: Secondary | ICD-10-CM

## 2017-11-21 DIAGNOSIS — M6281 Muscle weakness (generalized): Secondary | ICD-10-CM

## 2017-11-21 DIAGNOSIS — M5441 Lumbago with sciatica, right side: Secondary | ICD-10-CM

## 2017-11-21 DIAGNOSIS — M5442 Lumbago with sciatica, left side: Secondary | ICD-10-CM | POA: Diagnosis not present

## 2017-11-21 NOTE — Therapy (Signed)
[facility] [address] [place], [place], [address] Phone: [phone]   Fax:  [phone]  Physical Therapy Treatment  Patient Details  Name: Chelsea HandlerWadia [name] MRN: [id number] Date of Birth: 1941-05-30 Referring Provider: Dr. Benedict Needyoss McEntarfer   Encounter Date: 11/21/2017  PT End of Session - 11/21/17 1225    Visit Number  3    Date for PT Re-Evaluation  01/04/18    Authorization Type  Medicare    PT Start Time  1146    PT Stop Time  1225    PT Time Calculation (min)  39 min    Activity Tolerance  Patient tolerated treatment well    Behavior During Therapy  [facility] for tasks assessed/performed       Past Medical History:  Diagnosis Date  . Arthritis   . Atrial fibrillation (HCC)   . Atrial fibrillation with RVR (HCC) 03/04/2015  . CHF (congestive heart failure) (HCC)   . Hypertension     Past Surgical History:  Procedure Laterality Date  . NO PAST SURGERIES      There were no vitals filed for this visit.  Subjective Assessment - 11/21/17 1149    Subjective  I have been coughing at night and this is making my back hurt.      Patient Stated Goals  walk further without pain used to do 1/2 hour or around the block every day    Currently in Pain?  Yes    Pain Score  6     Pain Location  Back    Pain Orientation  Left;Right;Lower    Pain Descriptors / Indicators  Aching;Stabbing;Sharp    Pain Type  Acute pain    Pain Onset  More than a month ago    Pain Frequency  Constant    Aggravating Factors   standing and walking    Pain Relieving Factors  when sleeping, hot shower                      [facility] Adult PT Treatment/Exercise - 11/21/17 0001      Lumbar Exercises: Stretches   Active Hamstring Stretch  Left;Right;3 reps;20 seconds    Single Knee to Chest Stretch  Left;Right;3 reps;20 seconds    Lower Trunk Rotation  3 reps;20 seconds      Lumbar Exercises: Aerobic   Nustep  Level 2 x 6 minutes  PT present to discuss progress.       Lumbar Exercises: Seated   Other Seated Lumbar Exercises  attempted horizontal abduction with yellow band but too painful for shoulder      Lumbar Exercises: Supine   Bridge  20 reps;5 seconds      Knee/Hip Exercises: Seated   Ball Squeeze  5 second hold x 20               PT Short Term Goals - 11/21/17 1151      PT SHORT TERM GOAL #2   Title  The patient will report a 25% improvement in pain with standing and walking    Time  4    Period  Weeks    Status  On-going      PT SHORT TERM GOAL #3   Title  The patient will be able to walk 240 feet with minimal increase in back pain and LE weakness    Baseline  20 minutes    Status  Achieved        PT  Long Term Goals - 11/09/17 2008      PT LONG TERM GOAL #1   Title  The patient will be independent in safe self progression of HEP     Time  8    Period  Weeks    Status  New    Target Date  01/04/18      PT LONG TERM GOAL #2   Title  The patient will report a 50% improvement in pain and LE weakness with standing and walking    Time  8    Period  Weeks    Status  New      PT LONG TERM GOAL #3   Title  The patient will have improved right hip abduction and lumbo/pelvic core strength to grossly 4 to 4+/5 needed for standing and walking longer periods of time.     Time  8    Period  Weeks    Status  New      PT LONG TERM GOAL #4   Title  The patient will be able to walk 480 feet with minimal LBP and LE weakness     Time  8    Period  Weeks    Status  New      PT LONG TERM GOAL #5   Title  FOTO functional outcome score improved from 54% limitation to 37% indicating improved function with less pain    Time  8    Period  Weeks    Status  New            Plan - 11/21/17 1153    Clinical Impression Statement  Pt has been ill with a constant cough that is worse at night.  Pt with some pain reduction since last visit and reports consistent LBP.  Pt demonstrated improved  mobility today and was able to tolerate exercises and movement without limitation today.  PT focused on gentle flexibility and mobilty as tolerated today.  Pt will be traveling next week and will return after travel.      Rehab Potential  Good    PT Frequency  2x / week    PT Duration  8 weeks    PT Treatment/Interventions  ADLs/Self Care Home Management;Neuromuscular re-education;Dry needling;Electrical Stimulation;Moist Heat;Traction;Therapeutic exercise;Therapeutic activities;Patient/family education;Manual techniques;Taping;Ultrasound    PT Next Visit Plan  Nu Step, gentle flexion based exercise, Rt hip mobs    Consulted and Agree with Plan of Care  Patient       Patient will benefit from skilled therapeutic intervention in order to improve the following deficits and impairments:  Pain, Postural dysfunction, Decreased activity tolerance, Decreased range of motion, Decreased strength, Difficulty walking  Visit Diagnosis: Acute bilateral low back pain with bilateral sciatica  Muscle weakness (generalized)  Difficulty in walking, not elsewhere classified     Problem List Patient Active Problem List   Diagnosis Date Noted  . Chronic anticoagulation 03/06/2015  . Edema-dopplers negative for DVT 03/06/2015  . CHF exacerbation (HCC)   . SOB (shortness of breath)   . Chest pain   . Acute on chronic diastolic congestive heart failure (HCC)   . Atrial fibrillation with RVR (HCC) 03/04/2015  . Essential hypertension 02/24/2015  . Chronic diastolic heart failure (HCC) 12/10/2014  . Permanent atrial fibrillation (HCC) 12/10/2014  . Paroxysmal nocturnal dyspnea 12/10/2014     Lorrene Reid, PT 11/21/17 12:26 PM  [facility] [address] [place], [place], [address] Phone: [phone]  Fax:  [phone]  Name: Chelsea [name] MRN: [id number] Date of Birth: 25-Sep-1941

## 2017-11-23 ENCOUNTER — Ambulatory Visit: Payer: Medicare Other | Admitting: Physical Therapy

## 2017-11-23 DIAGNOSIS — M5442 Lumbago with sciatica, left side: Principal | ICD-10-CM

## 2017-11-23 DIAGNOSIS — M6281 Muscle weakness (generalized): Secondary | ICD-10-CM

## 2017-11-23 DIAGNOSIS — R262 Difficulty in walking, not elsewhere classified: Secondary | ICD-10-CM

## 2017-11-23 DIAGNOSIS — M5441 Lumbago with sciatica, right side: Secondary | ICD-10-CM

## 2017-11-23 NOTE — Patient Instructions (Signed)
Chelsea [name] PT [facility] [address], [address] [place], [place] [address] Phone # [phone] Fax [phone]    

## 2017-11-23 NOTE — Therapy (Signed)
[facility] [address] [place], [place], [address] Phone: [phone]   Fax:  [phone]  Physical Therapy Treatment  Patient Details  Name: Chelsea [name] MRN: [id number] Date of Birth: 1941-10-30 Referring Provider: Dr. Benedict Needy   Encounter Date: 11/23/2017  PT End of Session - 11/23/17 1308    Visit Number  4    Date for PT Re-Evaluation  01/04/18    Authorization Type  Medicare    PT Start Time  1225    PT Stop Time  1314    PT Time Calculation (min)  49 min    Activity Tolerance  Patient tolerated treatment well       Past Medical History:  Diagnosis Date  . Arthritis   . Atrial fibrillation (HCC)   . Atrial fibrillation with RVR (HCC) 03/04/2015  . CHF (congestive heart failure) (HCC)   . Hypertension     Past Surgical History:  Procedure Laterality Date  . NO PAST SURGERIES      There were no vitals filed for this visit.  Subjective Assessment - 11/23/17 1225    Subjective  Not too bad.  Hurts mostly with walking and standing.  Traveling by car to [place] then flying out of the country tomorrow (to [place]).  She plans to use a wheelchair.  She does state she is able to walk 10-15 min at a time now.      How long can you walk comfortably?  10-15 min    Currently in Pain?  Yes    Pain Score  5     Pain Location  Back    Pain Orientation  Lower;Right;Left    Pain Type  Acute pain         [facility] PT Assessment - 11/23/17 0001      AROM   Lumbar Flexion  65 no pain     Lumbar - Right Side Bend  30    Lumbar - Left Side Bend  30                  [facility] Adult PT Treatment/Exercise - 11/23/17 0001      Lumbar Exercises: Stretches   Active Hamstring Stretch  Right;Left;5 reps on 2nd step    Hip Flexor Stretch  Right;Left;5 reps on 2nd step    Other Lumbar Stretch Exercise  attempted doorway stretch but rightLBP increased so discontinued      Lumbar Exercises: Aerobic   Nustep  Level 2 x 6 minutes PT present to discuss progress.       Lumbar Exercises: Seated   Other Seated Lumbar Exercises  foam roll push down 15x    Other Seated Lumbar Exercises  seated red band rows 15x complained of shoulder pain      Lumbar Exercises: Supine   Isometric Hip Flexion  10 reps propped on wedge    Other Supine Lumbar Exercises  whole leg press down 10x right/left propped up on wedge    Other Supine Lumbar Exercises  ball squeeze and green band clams 10x each      Moist Heat Therapy   Number Minutes Moist Heat  12 Minutes    Moist Heat Location  Lumbar Spine      Electrical Stimulation   Electrical Stimulation Location  lumbar    Electrical Stimulation Action  IFC    Electrical Stimulation Parameters  9 ma 12 min    Electrical Stimulation Goals  Pain  PT Education - 11/23/17 1255    Education provided  Yes    Education Details  supine abdominal hand to opp knee isometric, sit to stand, seated push downs    Person(s) Educated  Patient    Methods  Explanation    Comprehension  Verbalized understanding       PT Short Term Goals - 11/23/17 1315      PT SHORT TERM GOAL #1   Title  The patient will demonstrate knowledge of basic self care strategies and exercises for mobility and pain control      Status  On-going      PT SHORT TERM GOAL #2   Title  The patient will report a 25% improvement in pain with standing and walking    Status  On-going      PT SHORT TERM GOAL #3   Title  The patient will be able to walk 240 feet with minimal increase in back pain and LE weakness    Status  Achieved        PT Long Term Goals - 11/09/17 2008      PT LONG TERM GOAL #1   Title  The patient will be independent in safe self progression of HEP     Time  8    Period  Weeks    Status  New    Target Date  01/04/18      PT LONG TERM GOAL #2   Title  The patient will report a 50% improvement in pain and LE weakness with standing and walking    Time   8    Period  Weeks    Status  New      PT LONG TERM GOAL #3   Title  The patient will have improved right hip abduction and lumbo/pelvic core strength to grossly 4 to 4+/5 needed for standing and walking longer periods of time.     Time  8    Period  Weeks    Status  New      PT LONG TERM GOAL #4   Title  The patient will be able to walk 480 feet with minimal LBP and LE weakness     Time  8    Period  Weeks    Status  New      PT LONG TERM GOAL #5   Title  FOTO functional outcome score improved from 54% limitation to 37% indicating improved function with less pain    Time  8    Period  Weeks    Status  New            Plan - 11/23/17 1308    Clinical Impression Statement  The patient is moving with much greater ease.  Pain intensity is relatively low in supine and sitting but increased quickly with standing.  Shoulder discomfort with resisted shoulder movements.  The patient reports she is able to walk longer but plans to use a wheelchair as she travels out of the country tomorrow.  She will return for follow up upon return from her trip.      Rehab Potential  Good    Clinical Impairments Affecting Rehab Potential  mild language barrier    PT Frequency  2x / week    PT Duration  8 weeks    PT Treatment/Interventions  ADLs/Self Care Home Management;Neuromuscular re-education;Dry needling;Electrical Stimulation;Moist Heat;Traction;Therapeutic exercise;Therapeutic activities;Patient/family education;Manual techniques;Taping;Ultrasound    PT Next Visit Plan  Nu Step, gentle flexion based  exercise, Rt hip mobs;  assess progress toward STGS       Patient will benefit from skilled therapeutic intervention in order to improve the following deficits and impairments:  Pain, Postural dysfunction, Decreased activity tolerance, Decreased range of motion, Decreased strength, Difficulty walking  Visit Diagnosis: Acute bilateral low back pain with bilateral sciatica  Muscle weakness  (generalized)  Difficulty in walking, not elsewhere classified     Problem List Patient Active Problem List   Diagnosis Date Noted  . Chronic anticoagulation 03/06/2015  . Edema-dopplers negative for DVT 03/06/2015  . CHF exacerbation (HCC)   . SOB (shortness of breath)   . Chest pain   . Acute on chronic diastolic congestive heart failure (HCC)   . Atrial fibrillation with RVR (HCC) 03/04/2015  . Essential hypertension 02/24/2015  . Chronic diastolic heart failure (HCC) 12/10/2014  . Permanent atrial fibrillation (HCC) 12/10/2014  . Paroxysmal nocturnal dyspnea 12/10/2014    Lavinia Sharps, PT 11/23/17 1:20 PM Phone: [phone] Fax: [phone]  Vivien Presto 11/23/2017, 1:19 PM  [facility] [address] [place], [place], [address] Phone: [phone]   Fax:  [phone]  Name: Chelsea [name] MRN: [id number] Date of Birth: [birth year]

## 2017-11-28 ENCOUNTER — Encounter: Payer: Medicare Other | Admitting: Physical Therapy

## 2017-11-30 ENCOUNTER — Encounter: Payer: Medicare Other | Admitting: Physical Therapy

## 2017-12-05 ENCOUNTER — Ambulatory Visit: Payer: Medicare Other | Admitting: Physical Therapy

## 2017-12-07 ENCOUNTER — Ambulatory Visit: Payer: Medicare Other | Admitting: Physical Therapy

## 2017-12-07 DIAGNOSIS — M5442 Lumbago with sciatica, left side: Principal | ICD-10-CM

## 2017-12-07 DIAGNOSIS — M5441 Lumbago with sciatica, right side: Secondary | ICD-10-CM

## 2017-12-07 DIAGNOSIS — R262 Difficulty in walking, not elsewhere classified: Secondary | ICD-10-CM

## 2017-12-07 DIAGNOSIS — M6281 Muscle weakness (generalized): Secondary | ICD-10-CM

## 2017-12-07 NOTE — Therapy (Signed)
[facility] [address] [place], [place], [address] Phone: [phone]   Fax:  [phone]  Physical Therapy Treatment  Patient Details  Name: Chelsea [name] MRN: [id number] Date of Birth: [birth year] Referring Provider: Dr. Latricia Heft   Encounter Date: 12/07/2017  PT End of Session - 12/07/17 1257    Visit Number  5    Date for PT Re-Evaluation  01/04/18    Authorization Type  Medicare    PT Start Time  3267    PT Stop Time  1315    PT Time Calculation (min)  44 min    Activity Tolerance  Patient tolerated treatment well       Past Medical History:  Diagnosis Date  . Arthritis   . Atrial fibrillation ([place])   . Atrial fibrillation with RVR ([place]) 03/04/2015  . CHF (congestive heart failure) ([place])   . Hypertension     Past Surgical History:  Procedure Laterality Date  . NO PAST SURGERIES      There were no vitals filed for this visit.  Subjective Assessment - 12/07/17 1237    Subjective  Patient returns from her trip.  Overall it went well but but trip was short.  Right side pain.  Able to walk slowly for 30 min.  She reports she is going to see the doctor on 2/7 and that he said if I wasn't better he would do surgery "but I don't want surgery."      How long can you walk comfortably?  30 min slowly    Currently in Pain?  Yes    Pain Score  5     Pain Location  Back    Pain Orientation  Right;Lower    Pain Type  Chronic pain    Aggravating Factors   standing and walking ;  sitting too long    Pain Relieving Factors  heat and TENs                      [facility] Adult PT Treatment/Exercise - 12/07/17 0001      Neuro Re-ed    Neuro Re-ed Details   abdominal brace; gluteal and quad activation       Lumbar Exercises: Stretches   Active Hamstring Stretch  Right;Left;5 reps on 2nd step    Hip Flexor Stretch  Right;Left;5 reps on 2nd step      Lumbar Exercises: Aerobic   Nustep  Level 2 x  6 minutes PT present to discuss progress.       Lumbar Exercises: Standing   Wall Slides  10 reps with green ball behind back      Lumbar Exercises: Seated   Other Seated Lumbar Exercises  foam roll push down 15x      Lumbar Exercises: Supine   Clam  15 reps green band double and single    Heel Slides  10 reps    Isometric Hip Flexion  -- propped on wedge with green band around thighs    Other Supine Lumbar Exercises  whole leg press down 15x right/left green band therapist holding propped up on wedge      Moist Heat Therapy   Number Minutes Moist Heat  14 Minutes    Moist Heat Location  Lumbar Spine      Electrical Stimulation   Electrical Stimulation Location  lumbar    Electrical Stimulation Action  IFC    Electrical Stimulation Parameters  9 ma 14 min  right lumbar    Electrical Stimulation Goals  Pain               PT Short Term Goals - 12/07/17 1307      PT SHORT TERM GOAL #1   Title  The patient will demonstrate knowledge of basic self care strategies and exercises for mobility and pain control      Status  Achieved      PT SHORT TERM GOAL #2   Title  The patient will report a 25% improvement in pain with standing and walking    Status  Achieved      PT SHORT TERM GOAL #3   Title  The patient will be able to walk 240 feet with minimal increase in back pain and LE weakness    Status  Achieved        PT Long Term Goals - 11/09/17 2008      PT LONG TERM GOAL #1   Title  The patient will be independent in safe self progression of HEP     Time  8    Period  Weeks    Status  New    Target Date  01/04/18      PT LONG TERM GOAL #2   Title  The patient will report a 50% improvement in pain and LE weakness with standing and walking    Time  8    Period  Weeks    Status  New      PT LONG TERM GOAL #3   Title  The patient will have improved right hip abduction and lumbo/pelvic core strength to grossly 4 to 4+/5 needed for standing and walking longer  periods of time.     Time  8    Period  Weeks    Status  New      PT LONG TERM GOAL #4   Title  The patient will be able to walk 480 feet with minimal LBP and LE weakness     Time  8    Period  Weeks    Status  New      PT LONG TERM GOAL #5   Title  FOTO functional outcome score improved from 54% limitation to 37% indicating improved function with less pain    Time  8    Period  Weeks    Status  New            Plan - 12/07/17 1258    Clinical Impression Statement  The patient reports she has been able to walk 30 min to 1 hour slowly now.  She also reports greater ease with negotiating her 15 steps at home.  In the clinic, she is moving with much greater ease without bracing and grimacing.  All STGs have been met.   She states she is "a lot" better since starting therapy.     Rehab Potential  Good    Clinical Impairments Affecting Rehab Potential  mild language barrier    PT Frequency  2x / week    PT Duration  8 weeks    PT Treatment/Interventions  ADLs/Self Care Home Management;Neuromuscular re-education;Dry needling;Electrical Stimulation;Moist Heat;Traction;Therapeutic exercise;Therapeutic activities;Patient/family education;Manual techniques;Taping;Ultrasound    PT Next Visit Plan  next visit recheck ROM; FOTO and LTGs for follow up with the MD 2/7 and to determine the need for continuation of PT ;  avoid much shoulder movement secondary to chronic history of shoulder pain       Patient  will benefit from skilled therapeutic intervention in order to improve the following deficits and impairments:  Pain, Postural dysfunction, Decreased activity tolerance, Decreased range of motion, Decreased strength, Difficulty walking  Visit Diagnosis: Acute bilateral low back pain with bilateral sciatica  Muscle weakness (generalized)  Difficulty in walking, not elsewhere classified     Problem List Patient Active Problem List   Diagnosis Date Noted  . Chronic anticoagulation  03/06/2015  . Edema-dopplers negative for DVT 03/06/2015  . CHF exacerbation ([place])   . SOB (shortness of breath)   . Chest pain   . Acute on chronic diastolic congestive heart failure ([place])   . Atrial fibrillation with RVR ([place]) 03/04/2015  . Essential hypertension 02/24/2015  . Chronic diastolic heart failure ([place]) 12/10/2014  . Permanent atrial fibrillation ([place]) 12/10/2014  . Paroxysmal nocturnal dyspnea 12/10/2014   Ruben Im, PT 12/07/17 1:16 PM Phone: [phone] Fax: [phone]  Alvera Singh 12/07/2017, 1:15 PM  [facility] [address] [place], [place], [address] Phone: [phone]   Fax:  [phone]  Name: Melannie Metzner MRN: [id number] Date of Birth: 1941-03-04

## 2017-12-11 ENCOUNTER — Ambulatory Visit: Payer: Medicare Other | Attending: Orthopaedic Surgery

## 2017-12-11 DIAGNOSIS — R262 Difficulty in walking, not elsewhere classified: Secondary | ICD-10-CM | POA: Diagnosis present

## 2017-12-11 DIAGNOSIS — M6281 Muscle weakness (generalized): Secondary | ICD-10-CM | POA: Diagnosis present

## 2017-12-11 DIAGNOSIS — M546 Pain in thoracic spine: Secondary | ICD-10-CM | POA: Insufficient documentation

## 2017-12-11 DIAGNOSIS — M5442 Lumbago with sciatica, left side: Secondary | ICD-10-CM | POA: Insufficient documentation

## 2017-12-11 DIAGNOSIS — M5441 Lumbago with sciatica, right side: Secondary | ICD-10-CM | POA: Diagnosis present

## 2017-12-11 NOTE — Therapy (Signed)
[facility] [address] [place], [place], [address] Phone: [phone]   Fax:  [phone]  Physical Therapy Treatment  Patient Details  Name: Chelsea [name] MRN: [id number] Date of Birth: 31-Dec-1940 Referring Provider: Dr. Benedict Needy   Encounter Date: 12/11/2017  PT End of Session - 12/11/17 1036    Visit Number  6    Date for PT Re-Evaluation  01/04/18    Authorization Type  Medicare    PT Start Time  1003    PT Stop Time  1035    PT Time Calculation (min)  32 min    Activity Tolerance  Patient tolerated treatment well    Behavior During Therapy  [facility] for tasks assessed/performed       Past Medical History:  Diagnosis Date  . Arthritis   . Atrial fibrillation ([facility])   . Atrial fibrillation with RVR ([facility]) 03/04/2015  . CHF (congestive heart failure) ([facility])   . Hypertension     Past Surgical History:  Procedure Laterality Date  . NO PAST SURGERIES      There were no vitals filed for this visit.  Subjective Assessment - 12/11/17 1001    Subjective  I am going to the MD 12/13/17.  No pain today.  Only mild pain over the weekend.      Currently in Pain?  No/denies    Pain Score  -- up to 5/10 over Rt thigh         [facility] PT Assessment - 12/11/17 0001      Observation/Other Assessments   Focus on Therapeutic Outcomes (FOTO)   40% limitation      Strength   Right Hip ABduction  4+/5                  [facility] Adult PT Treatment/Exercise - 12/11/17 0001      Lumbar Exercises: Stretches   Active Hamstring Stretch  Right;Left;5 reps on 2nd step      Lumbar Exercises: Aerobic   Nustep  Level 2 x 6 minutes PT present to discuss progress.       Lumbar Exercises: Standing   Wall Slides  10 reps with green ball behind back      Lumbar Exercises: Seated   Other Seated Lumbar Exercises  foam roll push down 15x      Lumbar Exercises: Supine   Clam  15 reps green band double and single    Heel  Slides  10 reps    Bridge  20 reps;5 seconds    Other Supine Lumbar Exercises  whole leg press: 5 second hold x 5 each leg               PT Short Term Goals - 12/11/17 1004      PT SHORT TERM GOAL #3   Title  The patient will be able to walk 240 feet with minimal increase in back pain and LE weakness    Status  Achieved        PT Long Term Goals - 12/11/17 1004      PT LONG TERM GOAL #1   Title  The patient will be independent in safe self progression of HEP     Time  8    Period  Weeks    Status  On-going      PT LONG TERM GOAL #3   Title  The patient will have improved right hip abduction and lumbo/pelvic core strength to grossly 4  to 4+/5 needed for standing and walking longer periods of time.     Time  8    Period  Weeks    Status  On-going      PT LONG TERM GOAL #4   Title  The patient will be able to walk 480 feet with minimal LBP and LE weakness     Status  Achieved      PT LONG TERM GOAL #5   Title  FOTO functional outcome score improved from 54% limitation to 37% indicating improved function with less pain    Baseline  40% limitation    Time  8    Period  Weeks    Status  On-going            Plan - 12/11/17 1019    Clinical Impression Statement  Pt reports 70-80% overall improvement in symptoms since the start of care.  Pt with improved mobility and can now lie supine and roll in bed without pain.  FOTO is improved to 40% limitation.  Rt hip abduction is 4+/5 and flexion is 4/5.  Pt has been able to sleep a full night for 2 nights without need for pain or sleep medication.  Pt denies pain today and reports up to 5/10 pain over Rt ilium.  Pt with hip and core weakness and she continues to work on this at home.   Pt will see MD this week to discuss future PT.      PT Frequency  2x / week    PT Duration  8 weeks    PT Treatment/Interventions  ADLs/Self Care Home Management;Neuromuscular re-education;Dry needling;Electrical Stimulation;Moist  Heat;Traction;Therapeutic exercise;Therapeutic activities;Patient/family education;Manual techniques;Taping;Ultrasound    PT Next Visit Plan  See what MD says. core strength, LE strength.  avoid much shoulder movement secondary to chronic history of shoulder pain    Consulted and Agree with Plan of Care  Patient       Patient will benefit from skilled therapeutic intervention in order to improve the following deficits and impairments:  Pain, Postural dysfunction, Decreased activity tolerance, Decreased range of motion, Decreased strength, Difficulty walking  Visit Diagnosis: Acute bilateral low back pain with bilateral sciatica  Muscle weakness (generalized)  Difficulty in walking, not elsewhere classified     Problem List Patient Active Problem List   Diagnosis Date Noted  . Chronic anticoagulation 03/06/2015  . Edema-dopplers negative for DVT 03/06/2015  . CHF exacerbation ([facility])   . SOB (shortness of breath)   . Chest pain   . Acute on chronic diastolic congestive heart failure ([facility])   . Atrial fibrillation with RVR ([facility]) 03/04/2015  . Essential hypertension 02/24/2015  . Chronic diastolic heart failure ([facility]) 12/10/2014  . Permanent atrial fibrillation ([facility]) 12/10/2014  . Paroxysmal nocturnal dyspnea 12/10/2014    Chelsea [name], PT 12/11/17 10:40 AM  [facility] [address], [address] [place], [place], [address] Phone: [phone]   Fax:  [phone]  Name: Chelsea [name] MRN: [id number] Date of Birth: 1940/11/08

## 2017-12-12 ENCOUNTER — Emergency Department (HOSPITAL_COMMUNITY)
Admission: EM | Admit: 2017-12-12 | Discharge: 2017-12-12 | Disposition: A | Discharge status: Home / Self Care | Payer: Medicare Other | Attending: Emergency Medicine | Admitting: Emergency Medicine

## 2017-12-12 ENCOUNTER — Encounter (HOSPITAL_COMMUNITY): Payer: Self-pay | Admitting: Emergency Medicine

## 2017-12-12 DIAGNOSIS — M545 Low back pain: Secondary | ICD-10-CM | POA: Diagnosis not present

## 2017-12-12 DIAGNOSIS — Z5321 Procedure and treatment not carried out due to patient leaving prior to being seen by health care provider: Secondary | ICD-10-CM | POA: Diagnosis not present

## 2017-12-12 NOTE — ED Notes (Signed)
Pt sts she dosent want to wait any longer, she is going home
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
## 2017-12-25 ENCOUNTER — Encounter (HOSPITAL_COMMUNITY): Payer: Self-pay | Admitting: Emergency Medicine

## 2017-12-25 ENCOUNTER — Emergency Department (HOSPITAL_COMMUNITY)
Admission: EM | Admit: 2017-12-25 | Discharge: 2017-12-25 | Disposition: A | Discharge status: Home / Self Care | Payer: Medicare Other | Attending: Emergency Medicine | Admitting: Emergency Medicine

## 2017-12-25 ENCOUNTER — Emergency Department (HOSPITAL_COMMUNITY): Payer: Medicare Other

## 2017-12-25 DIAGNOSIS — S0990XA Unspecified injury of head, initial encounter: Secondary | ICD-10-CM | POA: Diagnosis not present

## 2017-12-25 DIAGNOSIS — S32020A Wedge compression fracture of second lumbar vertebra, initial encounter for closed fracture: Secondary | ICD-10-CM | POA: Diagnosis not present

## 2017-12-25 DIAGNOSIS — I4891 Unspecified atrial fibrillation: Secondary | ICD-10-CM | POA: Diagnosis not present

## 2017-12-25 DIAGNOSIS — I10 Essential (primary) hypertension: Secondary | ICD-10-CM | POA: Insufficient documentation

## 2017-12-25 DIAGNOSIS — I5032 Chronic diastolic (congestive) heart failure: Secondary | ICD-10-CM | POA: Diagnosis not present

## 2017-12-25 DIAGNOSIS — Z79899 Other long term (current) drug therapy: Secondary | ICD-10-CM | POA: Diagnosis not present

## 2017-12-25 DIAGNOSIS — Y92012 Bathroom of single-family (private) house as the place of occurrence of the external cause: Secondary | ICD-10-CM | POA: Insufficient documentation

## 2017-12-25 DIAGNOSIS — Y93E1 Activity, personal bathing and showering: Secondary | ICD-10-CM | POA: Insufficient documentation

## 2017-12-25 DIAGNOSIS — R42 Dizziness and giddiness: Secondary | ICD-10-CM | POA: Diagnosis not present

## 2017-12-25 DIAGNOSIS — R0789 Other chest pain: Secondary | ICD-10-CM | POA: Diagnosis present

## 2017-12-25 DIAGNOSIS — W1830XA Fall on same level, unspecified, initial encounter: Secondary | ICD-10-CM | POA: Insufficient documentation

## 2017-12-25 DIAGNOSIS — S20212A Contusion of left front wall of thorax, initial encounter: Secondary | ICD-10-CM | POA: Insufficient documentation

## 2017-12-25 DIAGNOSIS — S32000A Wedge compression fracture of unspecified lumbar vertebra, initial encounter for closed fracture: Secondary | ICD-10-CM

## 2017-12-25 DIAGNOSIS — M25512 Pain in left shoulder: Secondary | ICD-10-CM | POA: Insufficient documentation

## 2017-12-25 DIAGNOSIS — Y999 Unspecified external cause status: Secondary | ICD-10-CM | POA: Insufficient documentation

## 2017-12-25 DIAGNOSIS — R51 Headache: Secondary | ICD-10-CM | POA: Diagnosis not present

## 2017-12-25 LAB — BASIC METABOLIC PANEL
Anion gap: 11 (ref 5–15)
BUN: 10 mg/dL (ref 6–20)
CO2: 26 mmol/L (ref 22–32)
Calcium: 9.1 mg/dL (ref 8.9–10.3)
Chloride: 104 mmol/L (ref 101–111)
Creatinine, Ser: 0.56 mg/dL (ref 0.44–1.00)
GFR calc Af Amer: >60 mL/min (ref >60)
Glucose, Bld: 108 mg/dL — ABNORMAL HIGH (ref 65–99)
POTASSIUM: 3.4 mmol/L — AB (ref 3.5–5.1)
Sodium: 141 mmol/L (ref 135–145)

## 2017-12-25 LAB — CBC WITH DIFFERENTIAL/PLATELET
Basophils Absolute: 0 10*3/uL (ref 0.0–0.1)
Basophils Relative: 0 %
EOS PCT: 2 %
Eosinophils Absolute: 0.2 10*3/uL (ref 0.0–0.7)
HEMATOCRIT: 42.5 % (ref 36.0–46.0)
Hemoglobin: 13.5 g/dL (ref 12.0–15.0)
LYMPHS ABS: 3.5 10*3/uL (ref 0.7–4.0)
LYMPHS PCT: 33 %
MCH: 29.7 pg (ref 26.0–34.0)
MCHC: 31.8 g/dL (ref 30.0–36.0)
MCV: 93.4 fL (ref 78.0–100.0)
MONO ABS: 0.9 10*3/uL (ref 0.1–1.0)
Monocytes Relative: 9 %
NEUTROS ABS: 6 10*3/uL (ref 1.7–7.7)
Neutrophils Relative %: 56 %
PLATELETS: 235 10*3/uL (ref 150–400)
RBC: 4.55 MIL/uL (ref 3.87–5.11)
RDW: 13.6 % (ref 11.5–15.5)
WBC: 10.6 10*3/uL — AB (ref 4.0–10.5)

## 2017-12-25 LAB — TROPONIN I: Troponin I: <0.03 ng/mL (ref <0.03)

## 2017-12-25 MED ORDER — DIAZEPAM 2 MG PO TABS
2.0000 mg | ORAL_TABLET | Freq: Once | ORAL | Status: AC
  Administered 2017-12-25: 2 mg via ORAL
  Filled 2017-12-25: qty 1

## 2017-12-25 MED ORDER — HYDROCODONE-ACETAMINOPHEN 5-325 MG PO TABS: 1.0000 | ORAL_TABLET | ORAL | 0 refills | Status: DC | PRN

## 2017-12-25 MED ORDER — OXYCODONE-ACETAMINOPHEN 5-325 MG PO TABS
1.0000 | ORAL_TABLET | Freq: Once | ORAL | Status: AC
  Administered 2017-12-25: 1 via ORAL
  Filled 2017-12-25: qty 1

## 2017-12-25 NOTE — ED Provider Notes (Signed)
[facility]-EMERGENCY DEPT Provider Note   CSN: [id number] Arrival date & time: 12/25/17  1048     History   Chief Complaint Chief Complaint  Patient presents with  . Fall  . Arm Pain  . left side pain    HPI Zenora Karpel is a 77 y.o. female.  77 year old female here complaining of left rib and hip pain after a fall prior to arrival.  States that she was trying to get into her bathtub and became dizzy lightheaded and loss of balance.  Has had issues with dizziness before in the past and this is no different.  Brief loss of consciousness.  She now complains of mild frontal headache without neck pain.  No upper or lower extremity weakness.  Does have some left lateral lower rib pain but no dyspnea or chest discomfort.  Denies any lower back pain.  Denies any hip pain.  Pain characterizes dull and worse movement and better with rest.  No treatment used prior to arrival    Past Medical History:  Diagnosis Date  . Arthritis   . Atrial fibrillation ([facility])   . Atrial fibrillation with RVR ([facility]) 03/04/2015  . CHF (congestive heart failure) ([facility])   . Hypertension     Patient Active Problem List   Diagnosis Date Noted  . Chronic anticoagulation 03/06/2015  . Edema-dopplers negative for DVT 03/06/2015  . CHF exacerbation ([facility])   . SOB (shortness of breath)   . Chest pain   . Acute on chronic diastolic congestive heart failure ([facility])   . Atrial fibrillation with RVR ([facility]) 03/04/2015  . Essential hypertension 02/24/2015  . Chronic diastolic heart failure ([facility]) 12/10/2014  . Permanent atrial fibrillation ([facility]) 12/10/2014  . Paroxysmal nocturnal dyspnea 12/10/2014    Past Surgical History:  Procedure Laterality Date  . NO PAST SURGERIES      OB History    No data available       Home Medications    Prior to Admission medications   Medication Sig Start Date End Date Taking? Authorizing Provider  dabigatran (PRADAXA) 150 MG CAPS capsule Take 1 capsule  (150 mg total) by mouth 2 (two) times daily. 06/08/17   Jake Bathe, MD  diltiazem (CARDIZEM CD) 120 MG 24 hr capsule Take 1 capsule (120 mg total) by mouth daily. 06/08/17   Jake Bathe, MD  furosemide (LASIX) 40 MG tablet Take 1 tablet (40 mg total) by mouth daily. 06/08/17   Jake Bathe, MD  HYDROcodone-acetaminophen (NORCO/VICODIN) 5-325 MG tablet Take 1-2 tablets by mouth every 6 (six) hours as needed for severe pain. 12/18/15   Gwyneth Sprout, MD  lisinopril (PRINIVIL,ZESTRIL) 2.5 MG tablet Take 1 tablet (2.5 mg total) by mouth daily. 01/18/17   Janetta Hora, PA-C  metoprolol succinate (TOPROL-XL) 50 MG 24 hr tablet Take 1 tablet (50 mg total) by mouth 2 (two) times daily. 06/08/17   Jake Bathe, MD    Family History Family History  Problem Relation Age of Onset  . Heart failure Father     Social History Social History   Tobacco Use  . Smoking status: Never Smoker  . Smokeless tobacco: Never Used  Substance Use Topics  . Alcohol use: No  . Drug use: No     Allergies   Eliquis [apixaban]; Shrimp [shellfish allergy]; and Xarelto [rivaroxaban]   Review of Systems Review of Systems  All other systems reviewed and are negative.    Physical Exam Updated Vital Signs BP [name])  148/89 (BP Location: Left Arm)   Pulse 96   Temp 97.8 F (36.6 C) (Oral)   Resp 18   LMP  (LMP Unknown)   SpO2 100%   Physical Exam  Constitutional: She is oriented to person, place, and time. She appears well-developed and well-nourished.  Non-toxic appearance. No distress.  HENT:  Head: Normocephalic and atraumatic.  Eyes: Conjunctivae, EOM and lids are normal. Pupils are equal, round, and reactive to light.  Neck: Normal range of motion. Neck supple. No tracheal deviation present. No thyroid mass present.  Cardiovascular: Normal rate, regular rhythm and normal heart sounds. Exam reveals no gallop.  No murmur heard. Pulmonary/Chest: Effort normal and breath sounds normal. No  stridor. No respiratory distress. She has no decreased breath sounds. She has no wheezes. She has no rhonchi. She has no rales.    Abdominal: Soft. Normal appearance and bowel sounds are normal. She exhibits no distension. There is no tenderness. There is no rebound and no CVA tenderness.  Musculoskeletal: Normal range of motion. She exhibits no edema or tenderness.  Neurological: She is alert and oriented to person, place, and time. She has normal strength. No cranial nerve deficit or sensory deficit. GCS eye subscore is 4. GCS verbal subscore is 5. GCS motor subscore is 6.  Skin: Skin is warm and dry. No abrasion and no rash noted.  Psychiatric: She has a normal mood and affect. Her speech is normal and behavior is normal.  Nursing note and vitals reviewed.    ED Treatments / Results  Labs (all labs ordered are listed, but only abnormal results are displayed) Labs Reviewed  CBC WITH DIFFERENTIAL/PLATELET  BASIC METABOLIC PANEL  TROPONIN I    EKG  EKG Interpretation None       Radiology Dg Ribs Unilateral W/chest Left  Result Date: 12/25/2017 CLINICAL DATA:  77 year old female status post fall this morning with left anterior and lateral rib pain. Left shoulder pain. EXAM: LEFT RIBS AND CHEST - 3+ VIEW COMPARISON:  Chest radiographs 03/06/2017 and earlier. FINDINGS: Stable lung volumes. Stable cardiomegaly and mediastinal contours. Visualized tracheal air column is within normal limits. No pneumothorax, pulmonary edema, pleural effusion or confluent pulmonary opacity. Stable and negative visible bowel gas pattern. Mild compression of the L2 vertebral body is new since 2018. There is associated new or increased dextroconvex lumbar spine curvature. No displaced left rib fracture identified. Osseous structures of the left shoulder appears stable. No other No acute osseous abnormality identified. IMPRESSION: 1. L2 compression fracture is new since 2018 but age indeterminate. If there is  back pain and specific therapy such as vertebroplasty is desired, Lumbar MRI or Nuclear Medicine Whole-body Bone Scan would best determine acuity. 2.  No displaced left rib fracture identified. 3.  No acute cardiopulmonary abnormality. Electronically Signed   By: Odessa Fleming M.D.   On: 12/25/2017 11:56   Dg Shoulder Left  Result Date: 12/25/2017 CLINICAL DATA:  Left shoulder pain after fall. EXAM: LEFT SHOULDER - 2+ VIEW COMPARISON:  Left shoulder x-rays dated November 13, 2009. FINDINGS: No acute fracture or dislocation. Mild glenohumeral and acromioclavicular joint space narrowing with subacromial spurring. Osteopenia. Soft tissues are unremarkable. IMPRESSION: 1.  No acute osseous abnormality.  Mild osteoarthritis. Electronically Signed   By: Obie Dredge M.D.   On: 12/25/2017 11:54    Procedures Procedures (including critical care time)  Medications Ordered in ED Medications  oxyCODONE-acetaminophen (PERCOCET/ROXICET) 5-325 MG per tablet 1 tablet (not administered)  diazepam (VALIUM) tablet 2 mg (  not administered)     Initial Impression / Assessment and Plan / ED Course  I have reviewed the triage vital signs and the nursing notes.  Pertinent labs & imaging results that were available during my care of the patient were reviewed by me and considered in my medical decision making (see chart for details).    Patient medicated for pain here and feels better.  Offered head CT and she has deferred. Chest x-ray without rib fracture but does have a L2 compression fracture.  Daughter states that she did fall back in November.  Will give prescription for analgesics and return precautions Final Clinical Impressions(s) / ED Diagnoses   Final diagnoses:  None    ED Discharge Orders    None       Lorre NickAllen, Sofya Moustafa, MD 12/25/17 2118

## 2017-12-25 NOTE — ED Triage Notes (Signed)
Patient reports she fell getting into bath. C/o left arm and left side pain. Denies any LOC.

## 2018-01-04 ENCOUNTER — Ambulatory Visit: Payer: Medicare Other | Admitting: Physical Therapy

## 2018-01-04 ENCOUNTER — Encounter: Payer: Self-pay | Admitting: Physical Therapy

## 2018-01-04 DIAGNOSIS — M5441 Lumbago with sciatica, right side: Secondary | ICD-10-CM

## 2018-01-04 DIAGNOSIS — M6281 Muscle weakness (generalized): Secondary | ICD-10-CM

## 2018-01-04 DIAGNOSIS — M546 Pain in thoracic spine: Secondary | ICD-10-CM

## 2018-01-04 DIAGNOSIS — R262 Difficulty in walking, not elsewhere classified: Secondary | ICD-10-CM

## 2018-01-04 DIAGNOSIS — M5442 Lumbago with sciatica, left side: Principal | ICD-10-CM

## 2018-01-04 NOTE — Therapy (Signed)
[facility] [address], [address] [place], [place], [address] Phone: [phone]   Fax:  [phone]  Physical Therapy Treatment/Recertification  Patient Details  Name: Chelsea [name] MRN: [id number] Date of Birth: [birth year] Referring Provider: Dr. Benedict Needy   Encounter Date: 01/04/2018  PT End of Session - 01/04/18 1559    Visit Number  7    Date for PT Re-Evaluation  03/01/18    Authorization Type  Medicare    PT Start Time  1445    PT Stop Time  1530    PT Time Calculation (min)  45 min    Activity Tolerance  Patient limited by pain       Past Medical History:  Diagnosis Date  . Arthritis   . Atrial fibrillation (HCC)   . Atrial fibrillation with RVR (HCC) 03/04/2015  . CHF (congestive heart failure) (HCC)   . Hypertension     Past Surgical History:  Procedure Laterality Date  . NO PAST SURGERIES      There were no vitals filed for this visit.  Subjective Assessment - 01/04/18 1450    Subjective  The patient presents with a new PT order for rib pain following a fall while in the bathtub 2/18 (10 days ago).   She reports she fell b/c she got dizzy. Sustained rib fracture on the left.  Head scans negative.  Also complains of left shoulder pain.  My back is "OK".      Currently in Pain?  Yes    Pain Score  5  no pain at times;  sometimes 10    Pain Location  Rib cage    Pain Orientation  Left    Pain Type  Acute pain    Aggravating Factors   deep breath, sneeze    Pain Relieving Factors  medicine; oils         [facility] PT Assessment - 01/04/18 0001      AROM   Left Shoulder Flexion  135 Degrees    Left Shoulder ABduction  120 Degrees    Left Shoulder Internal Rotation  -- L1    Left Shoulder External Rotation  10 Degrees    Thoracic Flexion  WFLs    Thoracic Extension  25% limited    Thoracic - Right Side Bend  50% limited painful    Thoracic - Left Side Bend  50% limited painful       Transfers    Comments  Difficulty with supine to sit secondary to rib and shoulder pain on left                  [facility] Adult PT Treatment/Exercise - 01/04/18 0001      Moist Heat Therapy   Number Minutes Moist Heat  15 Minutes    Moist Heat Location  Shoulder;Other (comment)      Electrical Stimulation   Electrical Stimulation Location  left ribs; left shoulder    Electrical Stimulation Action  Pre-mod    Electrical Stimulation Parameters  12 ma 12 min     Electrical Stimulation Goals  Pain               PT Short Term Goals - 12/11/17 1004      PT SHORT TERM GOAL #3   Title  The patient will be able to walk 240 feet with minimal increase in back pain and LE weakness    Status  Achieved  PT Long Term Goals - 01/04/18 1740      PT LONG TERM GOAL #1   Title  The patient will be independent in safe self progression of HEP     Time  8    Period  Weeks    Status  On-going    Target Date  03/01/18      PT LONG TERM GOAL #2   Title  The patient will report a 50% improvement in back and rib pain with rolling, supine to sit and standing/walking    Time  8    Period  Weeks    Status  Revised      PT LONG TERM GOAL #3   Title  The patient will have improved right hip abduction and lumbo/pelvic core strength to grossly 4 to 4+/5 needed for standing and walking longer periods of time.     Time  8    Period  Weeks    Status  On-going      PT LONG TERM GOAL #4   Title  The patient will have normal thoracic mobility and rib cage expansion for taking a deep breath with minimal discomfort    Time  8    Period  Weeks    Status  New      PT LONG TERM GOAL #5   Title  FOTO functional outcome score improved from 54% limitation to 37% indicating improved function with less pain    Time  8    Period  Weeks    Status  On-going            Plan - 01/04/18 1733    Clinical Impression Statement  The patient presents today with a new PT prescription for ribcage pain  after a fall 10 days ago.   Patient's previous PT diagnosis of LBP.   She also complains of left shoulder pain which is painful and very limited in ROM.  Her low back pain has not worsened with the recent fall.  She has great difficulty rolling and transferring from supine to sitting secondary to rib pain and shoulder pain.  Shoulder is very painful with even passive ROM.  Recommend she have her shoulder x-rayed prior to return to PT.      Rehab Potential  Good    Clinical Impairments Affecting Rehab Potential  mild language barrier    PT Frequency  2x / week    PT Duration  8 weeks    PT Treatment/Interventions  ADLs/Self Care Home Management;Neuromuscular re-education;Dry needling;Electrical Stimulation;Moist Heat;Traction;Therapeutic exercise;Therapeutic activities;Patient/family education;Manual techniques;Taping;Ultrasound    PT Next Visit Plan  see if she had a shoulder x-ray;  very gentle deep breathing and lumbar/thoracic mobility as tolerated;   modalities for pain control        Patient will benefit from skilled therapeutic intervention in order to improve the following deficits and impairments:  Pain, Postural dysfunction, Decreased activity tolerance, Decreased range of motion, Decreased strength, Difficulty walking  Visit Diagnosis: Acute bilateral low back pain with bilateral sciatica - Plan: PT plan of care cert/re-cert  Muscle weakness (generalized) - Plan: PT plan of care cert/re-cert  Difficulty in walking, not elsewhere classified - Plan: PT plan of care cert/re-cert  Pain in thoracic spine - Plan: PT plan of care cert/re-cert     Problem List Patient Active Problem List   Diagnosis Date Noted  . Chronic anticoagulation 03/06/2015  . Edema-dopplers negative for DVT 03/06/2015  . CHF exacerbation (HCC)   .  SOB (shortness of breath)   . Chest pain   . Acute on chronic diastolic congestive heart failure (HCC)   . Atrial fibrillation with RVR (HCC) 03/04/2015  .  Essential hypertension 02/24/2015  . Chronic diastolic heart failure (HCC) 12/10/2014  . Permanent atrial fibrillation (HCC) 12/10/2014  . Paroxysmal nocturnal dyspnea 12/10/2014   Lavinia Sharps, PT 01/04/18 5:47 PM Phone: [phone] Fax: [phone]  Vivien Presto 01/04/2018, 5:46 PM  [facility] [address] [place], [place], [address] Phone: [phone]   Fax:  [phone]  Name: Chelsea [name] MRN: [id number] Date of Birth: 1940/11/22

## 2018-01-18 ENCOUNTER — Ambulatory Visit (INDEPENDENT_AMBULATORY_CARE_PROVIDER_SITE_OTHER): Payer: Medicare Other | Admitting: Cardiology

## 2018-01-18 ENCOUNTER — Encounter: Payer: Self-pay | Admitting: Cardiology

## 2018-01-18 VITALS — BP 112/70 | HR 82 | Ht 64.0 in | Wt 183.4 lb

## 2018-01-18 DIAGNOSIS — I10 Essential (primary) hypertension: Secondary | ICD-10-CM | POA: Diagnosis not present

## 2018-01-18 DIAGNOSIS — I482 Chronic atrial fibrillation, unspecified: Secondary | ICD-10-CM

## 2018-01-18 NOTE — Patient Instructions (Signed)
  Medication Instructions:  Please start Eliquis 5 mg twice a day. Continue all other medications as listed.  Follow-Up: Follow up in 6 months with Nada BoozerLaura Ingold, NP.  You will receive a letter in the mail 2 months before you are due.  Please call us when you receive this letter to schedule your follow up appointment.  Follow up in 1 year with Dr. Anne FuSkains.  You will receive a letter in the mail 2 months before you are due.  Please call us when you receive this letter to schedule your follow up appointment.  If you need a refill on your cardiac medications before your next appointment, please call your pharmacy.  Thank you for choosing [facility]!!

## 2018-01-18 NOTE — Progress Notes (Signed)
Cardiology Office Note:    Date:  01/18/2018   ID:  Chelsea [name], DOB [birth year], MRN [id number]  PCP:  Patient, No Pcp Per  Cardiologist:  Donato SchultzMark Anan Dapolito, MD   Referring MD: No ref. provider found     History of Present Illness:    Chelsea [name] is a 77 y.o. female with history of permanent atrial fibrillation on Xarelto, chronic diastolic heart failure, hyperlipidemia with mild to moderate mitral regurgitation, former patient of Dr. Patty SermonsBrackbill here for follow-up.  Both Xarelto and Eliquis caused rash on her back she thinks, but now is back on Eliquis and doing well. Has some samples.   Previously had stopped these meds because of allery for past 3 weeks. Rash on back. She showed me a picture. Has seen an allergist. Shrimp will cause allergy.   No shortness of breath. No CP, no fevers.   She fell and broke rib. Taking one oxy a day.     Past Medical History:  Diagnosis Date  . Arthritis   . Atrial fibrillation (HCC)   . Atrial fibrillation with RVR (HCC) 03/04/2015  . CHF (congestive heart failure) (HCC)   . Hypertension     Past Surgical History:  Procedure Laterality Date  . NO PAST SURGERIES      Current Medications: Current Meds  Medication Sig  . apixaban (ELIQUIS) 5 MG TABS tablet Take 5 mg by mouth 2 (two) times daily.  [name]. diltiazem (CARDIZEM CD) 120 MG 24 hr capsule Take 1 capsule (120 mg total) by mouth daily.  . metoprolol succinate (TOPROL-XL) 50 MG 24 hr tablet Take 1 tablet (50 mg total) by mouth 2 (two) times daily.  [name]. oxyCODONE-acetaminophen (PERCOCET/ROXICET) 5-325 MG tablet Take 1 tablet by mouth daily.  . penicillin v potassium (VEETID) 500 MG tablet Take 500 mg by mouth 3 (three) times daily.     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 Marital status: Widowed    Spouse name: None  . Number of children: None  . Years of education: None  . Highest education  level: None  Social Needs  . Financial resource strain: None  . Food insecurity - worry: None  . Food insecurity - inability: None  . Transportation needs - medical: None  . Transportation needs - non-medical: None  Occupational History  . None  Tobacco Use  . Smoking status: Never Smoker  . Smokeless tobacco: Never Used  Substance and Sexual Activity  . Alcohol use: No  . Drug use: No  . Sexual activity: No  Other Topics Concern  . None  Social History Narrative  . None     Family History: The patient's family history includes Heart failure in her father. ROS:   Please see the history of present illness.     All other neg  EKGs/Labs/Other Studies Reviewed:    The following studies were reviewed today: Prior office notes reviewed, EKG reviewed, lab work reviewed  EKG: Prior EKG demonstrates atrial fibrillation  Recent Labs: 12/25/2017: BUN 10; Creatinine, Ser 0.56; Hemoglobin 13.5; Platelets 235; Potassium 3.4; Sodium 141  Recent Lipid Panel    Component Value Date/Time   CHOL 199 04/23/2013 0445   TRIG 85 04/23/2013 0445   HDL 50 04/23/2013 0445   CHOLHDL 4.0 04/23/2013 0445   VLDL 17 04/23/2013 0445   LDLCALC 132 (H) 04/23/2013 0445    Physical Exam:    VS:  BP 112/70  Pulse 82   Ht 5\' 4"  (1.626 m)   Wt 183 lb 6.4 oz (83.2 kg)   LMP  (LMP Unknown)   SpO2 97%   BMI 31.48 kg/m     Wt Readings from Last 3 Encounters:  01/18/18 183 lb 6.4 oz (83.2 kg)  06/08/17 190 lb 12.8 oz (86.5 kg)  04/14/17 188 lb (85.3 kg)     GEN: Well nourished, well developed, in no acute distress  HEENT: normal  Neck: no JVD, carotid bruits, or masses Cardiac: irreg irreg; no murmurs, rubs, or gallops,no edema  Respiratory:  clear to auscultation bilaterally, normal work of breathing GI: soft, nontender, nondistended, + BS MS: no deformity or atrophy  Skin: warm and dry, no rash Neuro:  Alert and Oriented x 3, Strength and sensation are intact Psych: euthymic mood,  full affect   ASSESSMENT:    1. Chronic atrial fibrillation (HCC)   2. Essential hypertension    PLAN:    In order of problems listed above:  Permanent atrial fibrillation  - Well rate controlled. Diltiazem and metoprolol. No changes made. Reviewed with patient.   Skin rash   - taking Eliquis once a day now with no rash. Encouraged twice a day. She is willing  Mild to moderate mitral regurgitation  - Stable. No significant shortness of breath.  Doing well.    Medication Adjustments/Labs and Tests Ordered: Current medicines are reviewed at length with the patient today.  Concerns regarding medicines are outlined above.  No orders of the defined types were placed in this encounter.  No orders of the defined types were placed in this encounter.   Signed, Donato Schultz, MD  01/18/2018 2:10 PM    [facility]

## 2018-02-13 ENCOUNTER — Ambulatory Visit: Payer: Medicare Other | Attending: Orthopaedic Surgery | Admitting: Physical Therapy

## 2018-02-13 ENCOUNTER — Encounter: Payer: Self-pay | Admitting: Physical Therapy

## 2018-02-13 DIAGNOSIS — M5441 Lumbago with sciatica, right side: Secondary | ICD-10-CM | POA: Insufficient documentation

## 2018-02-13 DIAGNOSIS — R262 Difficulty in walking, not elsewhere classified: Secondary | ICD-10-CM | POA: Diagnosis present

## 2018-02-13 DIAGNOSIS — M6281 Muscle weakness (generalized): Secondary | ICD-10-CM | POA: Diagnosis present

## 2018-02-13 DIAGNOSIS — M5442 Lumbago with sciatica, left side: Secondary | ICD-10-CM | POA: Insufficient documentation

## 2018-02-13 DIAGNOSIS — M546 Pain in thoracic spine: Secondary | ICD-10-CM

## 2018-02-13 NOTE — Therapy (Signed)
[facility] [address], [address] [place], [place], [address] Phone: [phone]   Fax:  [phone]  Physical Therapy Treatment  Patient Details  Name: Chelsea [name] MRN: [id number] Date of Birth: 10-06-1941 Referring Provider: Dr. Benedict Needy   Encounter Date: 02/13/2018  PT End of Session - 02/13/18 1517    Visit Number  8    Date for PT Re-Evaluation  03/01/18    Authorization Type  Medicare    PT Start Time  1445    PT Stop Time  1530    PT Time Calculation (min)  45 min    Activity Tolerance  Patient tolerated treatment well       Past Medical History:  Diagnosis Date  . Arthritis   . Atrial fibrillation (HCC)   . Atrial fibrillation with RVR (HCC) 03/04/2015  . CHF (congestive heart failure) (HCC)   . Hypertension     Past Surgical History:  Procedure Laterality Date  . NO PAST SURGERIES      There were no vitals filed for this visit.  Subjective Assessment - 02/13/18 1449    Subjective  Patient returns after a gap in care.  Her primary complaint is new onset foot pain.  She states her back and rib pain is much better.  The shoulder pain is less too.  X-rays were negative of shoulder.      Currently in Pain?  Yes    Pain Score  7     Pain Location  Back    Pain Orientation  Right    Pain Type  Chronic pain    Aggravating Factors   nothing in particular    Pain Relieving Factors  oils         [facility] PT Assessment - 02/13/18 0001      Observation/Other Assessments   Focus on Therapeutic Outcomes (FOTO)   34% limitation       AROM   Left Shoulder Flexion  135 Degrees    Left Shoulder ABduction  150 Degrees    Left Shoulder Internal Rotation  -- L1 bil    Left Shoulder External Rotation  50 Degrees    Lumbar Flexion  75 finger tips to floor    Lumbar Extension  15    Lumbar - Right Side Bend  30    Lumbar - Left Side Bend  30    Thoracic Flexion  WFLs    Thoracic Extension  WFLs    Thoracic  - Right Side Bend  20% limited    Thoracic - Left Side Bend  20% limited      Strength   Right/Left Hip  -- right hip flexion painful in low back     Right Hip ABduction  -- with pain                   [facility] Adult PT Treatment/Exercise - 02/13/18 0001      Lumbar Exercises: Stretches   Active Hamstring Stretch  -- supine flossing 10x right/left    Single Knee to Chest Stretch  Left;Right;3 reps;20 seconds    Double Knee to Chest Stretch  2 reps;30 seconds    Lower Trunk Rotation  2 reps;20 seconds    Figure 4 Stretch  1 rep;30 seconds      Lumbar Exercises: Supine   Bridge  10 reps    Other Supine Lumbar Exercises  UE flexion 8x      Moist Heat Therapy  Number Minutes Moist Heat  12 Minutes    Moist Heat Location  Lumbar Spine right      Electrical Stimulation   Electrical Stimulation Location  right lumbar    Electrical Stimulation Action  IFC    Electrical Stimulation Parameters  7ma 12 min seated    Electrical Stimulation Goals  Pain               PT Short Term Goals - 02/13/18 1522      PT SHORT TERM GOAL #1   Title  The patient will demonstrate knowledge of basic self care strategies and exercises for mobility and pain control      Status  Achieved      PT SHORT TERM GOAL #2   Title  The patient will report a 25% improvement in pain with standing and walking    Status  Achieved      PT SHORT TERM GOAL #3   Title  The patient will be able to walk 240 feet with minimal increase in back pain and LE weakness    Status  Achieved        PT Long Term Goals - 02/13/18 1500      PT LONG TERM GOAL #1   Title  The patient will be independent in safe self progression of HEP     Time  8    Period  Weeks    Status  On-going      PT LONG TERM GOAL #2   Title  The patient will report a 50% improvement in back and rib pain with rolling, supine to sit and standing/walking    Status  Achieved      PT LONG TERM GOAL #3   Title  The patient will  have improved right hip abduction and lumbo/pelvic core strength to grossly 4 to 4+/5 needed for standing and walking longer periods of time.     Time  8    Period  Weeks    Status  On-going      PT LONG TERM GOAL #4   Title  The patient will have normal thoracic mobility and rib cage expansion for taking a deep breath with minimal discomfort    Status  Achieved      PT LONG TERM GOAL #5   Title  FOTO functional outcome score improved from 54% limitation to 37% indicating improved function with less pain    Status  Achieved            Plan - 02/13/18 1517    Clinical Impression Statement  The patient has improved lumbar, thoracic and UE ROM with less complaint of pain.  Her FOTO functional outcome score has improved significantly from 54% limitation to 34% limitation.   She is able to do most of her housework but reports feeling generally fatigued following.  She continues to need instruction on appropriate exercises secondary to language barrier, fall in February and several weeks since last visit.  Therapist closely monitoring pain response with exercises and modifying as needed.      Rehab Potential  Good    Clinical Impairments Affecting Rehab Potential  mild language barrier    PT Frequency  2x / week    PT Duration  8 weeks    PT Treatment/Interventions  ADLs/Self Care Home Management;Neuromuscular re-education;Dry needling;Electrical Stimulation;Moist Heat;Traction;Therapeutic exercise;Therapeutic activities;Patient/family education;Manual techniques;Taping;Ultrasound    PT Next Visit Plan  modalties as needed;  lumbar mobility and core strengthening HEP 2-3 visits  Patient will benefit from skilled therapeutic intervention in order to improve the following deficits and impairments:  Pain, Postural dysfunction, Decreased activity tolerance, Decreased range of motion, Decreased strength, Difficulty walking  Visit Diagnosis: Acute bilateral low back pain with bilateral  sciatica  Muscle weakness (generalized)  Difficulty in walking, not elsewhere classified  Pain in thoracic spine     Problem List Patient Active Problem List   Diagnosis Date Noted  . Chronic anticoagulation 03/06/2015  . Edema-dopplers negative for DVT 03/06/2015  . CHF exacerbation (HCC)   . SOB (shortness of breath)   . Chest pain   . Acute on chronic diastolic congestive heart failure (HCC)   . Atrial fibrillation with RVR (HCC) 03/04/2015  . Essential hypertension 02/24/2015  . Chronic diastolic heart failure (HCC) 12/10/2014  . Permanent atrial fibrillation (HCC) 12/10/2014  . Paroxysmal nocturnal dyspnea 12/10/2014   Lavinia SharpsStacy Mikaele Stecher, PT 02/13/18 3:23 PM Phone: [phone] Fax: [phone]  Vivien PrestoSimpson, Terrion Gencarelli C 02/13/2018, 3:23 PM  [facility] [address], [address] [place], [place], [address] Phone: [phone]   Fax:  [phone]  Name: Chelsea HandlerWadia Romig MRN: [id number] Date of Birth: 1941/07/18

## 2018-02-21 ENCOUNTER — Ambulatory Visit: Payer: Medicare Other

## 2018-02-21 DIAGNOSIS — R262 Difficulty in walking, not elsewhere classified: Secondary | ICD-10-CM

## 2018-02-21 DIAGNOSIS — M5442 Lumbago with sciatica, left side: Secondary | ICD-10-CM | POA: Diagnosis not present

## 2018-02-21 DIAGNOSIS — M5441 Lumbago with sciatica, right side: Secondary | ICD-10-CM

## 2018-02-21 DIAGNOSIS — M546 Pain in thoracic spine: Secondary | ICD-10-CM

## 2018-02-21 DIAGNOSIS — M6281 Muscle weakness (generalized): Secondary | ICD-10-CM

## 2018-02-21 NOTE — Patient Instructions (Signed)
Access Code: [email or web address]  URL: [email or web address]  Date: 02/21/2018  Prepared by: Lorrene ReidKelly Albirtha Grinage   Exercises  Seated Shoulder Abduction - Palms Down - 10 reps - 2 sets - 2x daily - 7x weekly  Seated Shoulder Flexion - 10 reps - 2 sets - 2x daily - 7x weekly  Seated Shoulder Scaption - 10 reps - 2 sets - 2x daily - 7x weekly  Supine Shoulder Horizontal Abduction with Resistance - 10 reps - 2 sets - 1x daily - 7x weekly  Standing Hip Abduction with Anterior Support - 10 reps - 2 sets - 2x daily - 7x weekly  Standing Hip Extension with Counter Support - 10 reps - 2 sets - 2x daily - 7x weekly    [facility] [address], [address] [place], [place] [address] Phone # [phone] Fax [phone]

## 2018-02-21 NOTE — Therapy (Signed)
[facility] [address], [address] [place], [place], [address] Phone: [phone]   Fax:  [phone]  Physical Therapy Treatment  Patient Details  Name: Chelsea [name] MRN: [id number] Date of Birth: January 29, 1941 Referring Provider: Dr. Benedict Needyoss McEntarfer   Encounter Date: 02/21/2018  PT End of Session - 02/21/18 1303    Visit Number  9    Date for PT Re-Evaluation  03/01/18    Authorization Type  Medicare    PT Start Time  1233    PT Stop Time  1303    PT Time Calculation (min)  30 min    Activity Tolerance  Patient limited by fatigue    Behavior During Therapy  [facility] for tasks assessed/performed       Past Medical History:  Diagnosis Date  . Arthritis   . Atrial fibrillation (HCC)   . Atrial fibrillation with RVR (HCC) 03/04/2015  . CHF (congestive heart failure) (HCC)   . Hypertension     Past Surgical History:  Procedure Laterality Date  . NO PAST SURGERIES      There were no vitals filed for this visit.  Subjective Assessment - 02/21/18 1235    Subjective  I am feeling good today.  My Lt leg hurts after I sit and then stand up.      Currently in Pain?  No/denies    Pain Location  Back    Effect of Pain on Daily Activities  Pt reports that her Lt leg hurts after sitting and coming to standing                       [facility] Treatment/Exercise - 02/21/18 0001      Lumbar Exercises: Supine   Bridge  10 reps    Straight Leg Raise  20 reps    Other Supine Lumbar Exercises  shoulder horizontal abduction 2x10 with red band      Knee/Hip Exercises: Standing   Heel Raises  Both;2 sets;10 reps    Hip Abduction  Stengthening;Both;2 sets;20 reps;Knee straight with abdominal bracing    Hip Extension  Stengthening;Both;20 reps;Knee straight abdominal bracing             PT Education - 02/21/18 1254    Education provided  Yes    Education Details   [email or web address]  access code    Person(s) Educated   Patient    Methods  Explanation    Comprehension  Verbalized understanding;Returned demonstration       PT Short Term Goals - 02/13/18 1522      PT SHORT TERM GOAL #1   Title  The patient will demonstrate knowledge of basic self care strategies and exercises for mobility and pain control      Status  Achieved      PT SHORT TERM GOAL #2   Title  The patient will report a 25% improvement in pain with standing and walking    Status  Achieved      PT SHORT TERM GOAL #3   Title  The patient will be able to walk 240 feet with minimal increase in back pain and LE weakness    Status  Achieved        PT Long Term Goals - 02/13/18 1500      PT LONG TERM GOAL #1   Title  The patient will be independent in safe self progression of HEP     Time  8  Period  Weeks    Status  On-going      PT LONG TERM GOAL #2   Title  The patient will report a 50% improvement in back and rib pain with rolling, supine to sit and standing/walking    Status  Achieved      PT LONG TERM GOAL #3   Title  The patient will have improved right hip abduction and lumbo/pelvic core strength to grossly 4 to 4+/5 needed for standing and walking longer periods of time.     Time  8    Period  Weeks    Status  On-going      PT LONG TERM GOAL #4   Title  The patient will have normal thoracic mobility and rib cage expansion for taking a deep breath with minimal discomfort    Status  Achieved      PT LONG TERM GOAL #5   Title  FOTO functional outcome score improved from 54% limitation to 37% indicating improved function with less pain    Status  Achieved            Plan - 02/21/18 1254    Clinical Impression Statement  Pt has reduced upper and lower body strength and endurance and PT focused on establishing a HEP for strength and endurance.  Pt required frequenty tactile and demo cues with all exercise and further review is likely next session.  Session ended early due to fatigue.  Pt will benefit from  continued PT for strength, flexibility, pain management as needed and endurance.      Clinical Impairments Affecting Rehab Potential  mild language barrier    PT Frequency  2x / week    PT Duration  8 weeks    PT Treatment/Interventions  ADLs/Self Care Home Management;Neuromuscular re-education;Dry needling;Electrical Stimulation;Moist Heat;Traction;Therapeutic exercise;Therapeutic activities;Patient/family education;Manual techniques;Taping;Ultrasound    PT Next Visit Plan  modalties as needed;  lumbar mobility and core strengthening HEP 1-2 more visits    Consulted and Agree with Plan of Care  Patient       Patient will benefit from skilled therapeutic intervention in order to improve the following deficits and impairments:  Pain, Postural dysfunction, Decreased activity tolerance, Decreased range of motion, Decreased strength, Difficulty walking  Visit Diagnosis: Acute bilateral low back pain with bilateral sciatica  Muscle weakness (generalized)  Difficulty in walking, not elsewhere classified  Pain in thoracic spine     Problem List Patient Active Problem List   Diagnosis Date Noted  . Chronic anticoagulation 03/06/2015  . Edema-dopplers negative for DVT 03/06/2015  . CHF exacerbation (HCC)   . SOB (shortness of breath)   . Chest pain   . Acute on chronic diastolic congestive heart failure (HCC)   . Atrial fibrillation with RVR (HCC) 03/04/2015  . Essential hypertension 02/24/2015  . Chronic diastolic heart failure (HCC) 12/10/2014  . Permanent atrial fibrillation (HCC) 12/10/2014  . Paroxysmal nocturnal dyspnea 12/10/2014    Lorrene Reid, PT 02/21/18 1:04 PM  [facility] [address] [place], [place], [address] Phone: [phone]   Fax:  [phone]  Name: Chelsea [name] MRN: [id number] Date of Birth: 1941/10/19

## 2018-02-28 ENCOUNTER — Ambulatory Visit: Payer: Medicare Other

## 2018-02-28 DIAGNOSIS — M5442 Lumbago with sciatica, left side: Principal | ICD-10-CM

## 2018-02-28 DIAGNOSIS — M6281 Muscle weakness (generalized): Secondary | ICD-10-CM

## 2018-02-28 DIAGNOSIS — M546 Pain in thoracic spine: Secondary | ICD-10-CM

## 2018-02-28 DIAGNOSIS — R262 Difficulty in walking, not elsewhere classified: Secondary | ICD-10-CM

## 2018-02-28 DIAGNOSIS — M5441 Lumbago with sciatica, right side: Secondary | ICD-10-CM

## 2018-02-28 NOTE — Therapy (Signed)
[facility] [address], [place], [place], [address] Phone: [phone]   Fax:  [phone]  Physical Therapy Treatment  Patient Details  Name: Chelsea [name] MRN: [id number] Date of Birth: 09-Dec-1940 Referring Provider: Dr. Latricia Heft   Encounter Date: 02/28/2018  PT End of Session - 02/28/18 1510    Visit Number  10    Authorization Type  Medicare    PT Start Time  0102    PT Stop Time  1511    PT Time Calculation (min)  30 min    Activity Tolerance  Patient limited by fatigue    Behavior During Therapy  [facility] for tasks assessed/performed       Past Medical History:  Diagnosis Date  . Arthritis   . Atrial fibrillation ([place])   . Atrial fibrillation with RVR ([place]) 03/04/2015  . CHF (congestive heart failure) ([place])   . Hypertension     Past Surgical History:  Procedure Laterality Date  . NO PAST SURGERIES      There were no vitals filed for this visit.  Subjective Assessment - 02/28/18 1444    Subjective  I'm OK.  I've been doing my exercises.  My Lt arm and Lt leg feel better.      Pain Score  -- up to 3-4/10 in low back and Lt LE    Aggravating Factors   unknown cause    Pain Relieving Factors  stop and rest         [facility] PT Assessment - 02/28/18 0001      Assessment   Medical Diagnosis  neurogenic claudication and lumbar scoliosis      Prior Function   Level of Independence  Independent      Cognition   Overall Cognitive Status  Within Functional Limits for tasks assessed      Observation/Other Assessments   Focus on Therapeutic Outcomes (FOTO)   34% limitation       Strength   Right/Left Hip  Right    Right Hip ABduction  4+/5    Lumbar Flexion  4-/5                   [facility] Adult PT Treatment/Exercise - 02/28/18 0001      Lumbar Exercises: Stretches   Single Knee to Chest Stretch  Left;Right;3 reps;20 seconds    Lower Trunk Rotation  2 reps;20 seconds      Lumbar  Exercises: Aerobic   Nustep  Level 2 x 5 minutes fast pace, stopped at 5 minutes      Lumbar Exercises: Supine   Bridge  10 reps    Straight Leg Raise  20 reps    Other Supine Lumbar Exercises  shoulder horizontal abduction 2x10 with red band      Knee/Hip Exercises: Standing   Heel Raises  Both;2 sets;10 reps    Hip Abduction  Stengthening;Both;2 sets;20 reps;Knee straight with abdominal bracing    Hip Extension  Stengthening;Both;20 reps;Knee straight abdominal bracing               PT Short Term Goals - 02/13/18 1522      PT SHORT TERM GOAL #1   Title  The patient will demonstrate knowledge of basic self care strategies and exercises for mobility and pain control      Status  Achieved      PT SHORT TERM GOAL #2   Title  The patient will report a 25% improvement in pain  with standing and walking    Status  Achieved      PT SHORT TERM GOAL #3   Title  The patient will be able to walk 240 feet with minimal increase in back pain and LE weakness    Status  Achieved        PT Long Term Goals - 02/28/18 1445      PT LONG TERM GOAL #1   Title  The patient will be independent in safe self progression of HEP     Status  Achieved      PT LONG TERM GOAL #2   Title  The patient will report a 50% improvement in back and rib pain with rolling, supine to sit and standing/walking    Baseline  50% better      PT LONG TERM GOAL #3   Title  The patient will have improved right hip abduction and lumbo/pelvic core strength to grossly 4 to 4+/5 needed for standing and walking longer periods of time.       PT LONG TERM GOAL #4   Title  The patient will have normal thoracic mobility and rib cage expansion for taking a deep breath with minimal discomfort    Status  Achieved            Plan - 02/28/18 1451    Clinical Impression Statement  Pt reports 50% overall improvement in symptoms since the start of care.  PT has focused on establishing a HEP for strength and endurance  with addition of core strength with this activity.  Pt with intermittent Lt LE pain with activity and rates this pain as 3-4/10 when it occurs. Pt requires frequent verbal and tactile cues for correct technique with exercise today. Pt with 4+/5 Rt hip strength and 4-/5 core strength.   Pt reports fatigue with all exercise.  FOTO is improved to 34% limitation.  Pt will D/C to HEP for strength, flexibility and core activation.      PT Next Visit Plan  D/C to HEP    Consulted and Agree with Plan of Care  Patient       Patient will benefit from skilled therapeutic intervention in order to improve the following deficits and impairments:     Visit Diagnosis: Acute bilateral low back pain with bilateral sciatica  Muscle weakness (generalized)  Difficulty in walking, not elsewhere classified  Pain in thoracic spine     Problem List Patient Active Problem List   Diagnosis Date Noted  . Chronic anticoagulation 03/06/2015  . Edema-dopplers negative for DVT 03/06/2015  . CHF exacerbation ([place])   . SOB (shortness of breath)   . Chest pain   . Acute on chronic diastolic congestive heart failure ([place])   . Atrial fibrillation with RVR ([place]) 03/04/2015  . Essential hypertension 02/24/2015  . Chronic diastolic heart failure ([place]) 12/10/2014  . Permanent atrial fibrillation ([place]) 12/10/2014  . Paroxysmal nocturnal dyspnea 12/10/2014   PHYSICAL THERAPY DISCHARGE SUMMARY  Visits from Start of Care: 10   Current functional level related to goals / functional outcomes: See above for current status.     Remaining deficits: Pt has chronic pain and endurance deficits.  Pt has HEP in place to address remaining deficits.     Education / Equipment: Banker, HEP Plan: Patient agrees to discharge.  Patient goals were partially met. Patient is being discharged due to being pleased with the current functional level.  ?????  Sigurd Sos, PT 02/28/18 3:11 PM  [facility] [address], [place], [place], [address] Phone: [phone]   Fax:  [phone]  Name: Chelsea [name] MRN: [id number] Date of Birth: [birth year]

## 2018-05-09 ENCOUNTER — Other Ambulatory Visit: Payer: Self-pay | Admitting: Cardiology

## 2018-06-28 ENCOUNTER — Telehealth: Payer: Self-pay | Admitting: Cardiology

## 2018-06-28 NOTE — Telephone Encounter (Signed)
Patient's daughter called about her mother's medications. I advised her of her medications and specific doses. The patient is leaving the country 9[place]18 and wanted to schedule an appointment. She will call the office to schedule her 6 month follow up.

## 2018-06-28 NOTE — Telephone Encounter (Signed)
New Message:   Patient daughter calling concerning  what medication should  Mother be taking.

## 2018-07-04 ENCOUNTER — Telehealth: Payer: Self-pay

## 2018-07-04 NOTE — Telephone Encounter (Signed)
   [facility] Pre-operative Risk Assessment    Request for surgical clearance:  1. What type of surgery is being performed? Lumbar Transforaminal Epidural Steroid Injection  2. When is this surgery scheduled? 07/10/2018   3. What type of clearance is required (medical clearance vs. Pharmacy clearance to hold med vs. Both)? Both  4. Are there any medications that need to be held prior to surgery and how long? Eliquis 3 days   5. Practice name and name of physician performing surgery? [facility]/ Dr Francesco Runner   6. What is your office phone number [phone]    7.   What is your office fax number [phone]  8.   Anesthesia type (None, local, MAC, general) ?    Legrand Como  Orpah Hausner 07/04/2018, 10:45 AM  _________________________________________________________________   (provider comments below)

## 2018-07-05 ENCOUNTER — Other Ambulatory Visit: Payer: Self-pay | Admitting: Physician Assistant

## 2018-07-05 ENCOUNTER — Telehealth: Payer: Self-pay | Admitting: Physician Assistant

## 2018-07-05 MED ORDER — METOPROLOL SUCCINATE ER 50 MG PO TB24: 50.0000 mg | ORAL_TABLET | Freq: Two times a day (BID) | ORAL | 3 refills | Status: DC

## 2018-07-05 NOTE — Telephone Encounter (Signed)
Pt takes Eliquis for afib with CHADS2VASc score of 5 (age x2, sex, CHF, HTN). Renal function is normal. Ok to hold Eliquis for 3 days [name].

## 2018-07-05 NOTE — Telephone Encounter (Signed)
   Primary Cardiologist: Donato SchultzMark Skains, MD  Chart reviewed as part of pre-operative protocol coverage. Patient was contacted 07/05/2018 in reference to pre-operative risk assessment for pending surgery as outlined below.  Chelsea HandlerWadia [name] was last seen on 01/18/2018 by Dr. Anne FuSkains.  Since that day, Chelsea HandlerWadia [name] has done well. She denies any exertional chest pain or shortness of breath. Functional ability is only limited by back pain and leg pain. She feels her legs are stiff in the morning and hard to get going.   Therefore, based on ACC/AHA guidelines, the patient would be at acceptable risk for the planned procedure without further cardiovascular testing.   Please call with questions. I will forward this request to our clinical pharmacist to determine the duration to hold eliquis.   Chelsea CourseHao Alika [name], [place] 07/05/2018, 9:36 AM

## 2018-07-05 NOTE — Telephone Encounter (Signed)
I have refilled her metoprolol at her request during today's preop clearance. She also requested lasix 40mg  daily which is not on her medication list, I confirmed that this is a medication that exist on her primary care provider's medication list but not ours. She says she has been on it for over a year since her hospitalization for pulmonary edema. I will defer to PCP to prescribe the lasix. I updated our medication list as well.

## 2018-07-06 ENCOUNTER — Encounter: Payer: Self-pay | Admitting: Cardiology

## 2018-07-06 NOTE — Telephone Encounter (Signed)
This encounter was created in error - please disregard.

## 2018-07-06 NOTE — Telephone Encounter (Signed)
New Message:     Chelsea [name] is calling to check on the status of the pt's clearance. Procedure scheduled for 07/10/18

## 2018-07-06 NOTE — Telephone Encounter (Signed)
I called [phone] to confirm if clearance letter was received, though this went to a fax machine, looks like the phone and fax # were put in as the same. I called one of the other office for the correct fax # for Dr. Laurian Brim'Toole. I was given fax# [phone] and phone # of [phone]. I will fax this to the correct fax # .

## 2018-07-06 NOTE — Telephone Encounter (Signed)
Call back staff: [name]. Recommendations from Azalee CourseHao Meng, PA-C regarding surgical risk and our clinical pharmacist regarding holding anticoagulation will be faxed to the requesting surgeon. . Please contact the surgeon's office to ensure it has been received. . This phone note will be removed from the preop pool. . Please sign encounter when completed.  Tereso NewcomerScott Zakariye Nee, PA-C    07/06/2018 11:17 AM

## 2018-07-15 ENCOUNTER — Other Ambulatory Visit: Payer: Self-pay | Admitting: Cardiology

## 2018-07-29 ENCOUNTER — Other Ambulatory Visit: Payer: Self-pay | Admitting: Cardiology

## 2019-03-04 ENCOUNTER — Other Ambulatory Visit: Payer: Self-pay | Admitting: Cardiology

## 2019-03-05 ENCOUNTER — Other Ambulatory Visit: Payer: Self-pay | Admitting: *Deleted

## 2019-03-07 ENCOUNTER — Other Ambulatory Visit: Payer: Self-pay | Admitting: Cardiology

## 2019-03-19 ENCOUNTER — Ambulatory Visit (INDEPENDENT_AMBULATORY_CARE_PROVIDER_SITE_OTHER): Payer: Medicare Other | Admitting: Cardiology

## 2019-03-19 ENCOUNTER — Encounter: Payer: Self-pay | Admitting: Cardiology

## 2019-03-19 ENCOUNTER — Other Ambulatory Visit: Payer: Self-pay

## 2019-03-19 VITALS — BP 128/78 | HR 88 | Ht 64.0 in | Wt 181.0 lb

## 2019-03-19 DIAGNOSIS — I482 Chronic atrial fibrillation, unspecified: Secondary | ICD-10-CM

## 2019-03-19 DIAGNOSIS — Z789 Other specified health status: Secondary | ICD-10-CM

## 2019-03-19 DIAGNOSIS — I10 Essential (primary) hypertension: Secondary | ICD-10-CM

## 2019-03-19 MED ORDER — WARFARIN SODIUM 5 MG PO TABS: 5.0000 mg | ORAL_TABLET | Freq: Once | ORAL | 1 refills | Status: DC

## 2019-03-19 NOTE — Patient Instructions (Signed)
Medication Instructions:  Please discontinue your Eliquis and start Coumadin/Warfarin 5 mg once daily. You will need an appointment in the [facility]madin [facility] in 3 to 5 days.  If you need a refill on your cardiac medications before your next appointment, please call your pharmacy.   Follow-Up: At [facility], you and your health needs are our priority.  As part of our continuing mission to provide you with exceptional heart care, we have created designated Provider Care Teams.  These Care Teams include your primary Cardiologist (physician) and Advanced Practice Providers (APPs -  Physician Assistants and Nurse Practitioners) who all work together to provide you with the care you need, when you need it. You will need a follow up appointment in 2 months with Nada Boozer, NP.  Please call our office 2 months in advance to schedule this appointment.  You may see Donato Schultz, MD or one of the following Advanced Practice Providers on your designated Care Team:   Norma Fredrickson, NP Nada Boozer, NP  Georgie Chard, NP  Thank you for choosing [facility]!!     Warfarin Coagulopathy Warfarin coagulopathy refers to bleeding that may occur as a complication of the medicine warfarin. Warfarin is a blood thinner (anticoagulant). Anticoagulants prevent dangerous blood clots. Bleeding is the most common and most serious complication of warfarin. While taking warfarin, you will need to have blood tests (prothrombin tests, or PT tests) regularly to measure your blood clotting time. The PT test results will be reported as the International Normalized Ratio (INR). The INR tells your health care provider whether your dosage of warfarin needs to be changed. The longer it takes your blood to clot, the higher the INR. Your risk of warfarin coagulopathy increases as your INR increases. What are the causes? This condition may be caused by:  Taking too much warfarin (overdose).  Underlying medical  conditions.  Changes to your diet.  Interactions with medicines, supplements, or alcohol. What are the signs or symptoms? Warfarin coagulopathy may cause bleeding from any tissue or organ. Symptoms may include:  Bleeding from the gums.  A nosebleed that is not easily stopped.  Blood in stool. This may look like bright red, dark, or black, tarry stools.  Blood in urine. This may look like pink, red, or brown urine.  Unusual bruising or bruising easily.  A cut that does not stop bleeding within 10 minutes.  Coughing up blood.  Vomiting blood.  Feeling nauseous for longer than 1 day.  Broken blood vessels in the eye (subconjunctival hemorrhage). This may look like a bright red or dark red patch on the white part of the eye.  Abdominal or back pain with or without bruising.  Sudden, severe headache.  Sudden weakness or numbness of the face, arm, or leg, especially on one side of the body.  Sudden confusion.  Difficulty speaking (aphasia) or understanding speech.  Sudden trouble seeing out of one or both eyes.  Unexpected difficulty walking.  Dizziness.  Loss of balance or coordination.  Unusual vaginal bleeding.  Swelling or pain at an injection site.  Skin scarring due to tissue death (necrosis) of fatty tissue. This may cause pain in the waist, thighs, or buttocks. This is more common among women. How is this diagnosed? This condition is diagnosed after your health care provider places you on warfarin and then finds out how it affects your blood's ability to clot. Prothrombin time (PT) clotting tests are used to monitor your clotting factor. These tests also help  your health care provider to find the warfarin dose that is best for you. How is this treated? This condition is treated with vitamin K. Vitamin K helps the blood to clot. You may receive vitamin K every 12 hours, or as needed. You may also receive donated plasma (transfusions of fresh frozen plasma).  Plasma is the liquid part of blood, and contains substances that help the blood clot. Follow these instructions at home: Medicines  Take warfarin exactly as told by your health care provider. This ensures that you avoid bleeding or clots that could result in serious injury, pain, or disability.  Take your medicine at the same time every day. If you forget to take your dose of warfarin, take it as soon as you remember on that day. If you do not remember to take it on that day, do not take an extra dose the next day.  Contact your health care provider if you miss a dose or take an extra dose. Do not change your dosage on your own to make up for missed or extra doses.  Talk with your health care provider or your pharmacist before starting or stopping any new medicines. Many prescription and over-the-counter medicines can interfere with warfarin. This includes over-the-counter vitamins, dietary supplements, herbal medicines, and pain medicines. Your warfarin dosage may need to be adjusted. Eating and drinking  It is important to maintain a normal, balanced diet while taking warfarin. Avoid major changes in your diet. If you are planning to change your diet, talk with your health care provider before making changes.  Your health care provider may recommend that you work with a diet and nutrition specialist (dietitian).  Vitamin K makes warfarin less effective. It is found in many foods. Eat a consistent amount of foods that contain vitamin K. For example, you may decide to eat 2 vitamin K-containing foods each day. Your warfarin dose is set according to the amount of vitamin K in your blood.  Eat a consistent amount of foods that contain vitamin K. Vitamin K makes warfarin less effective, so eating the same amount each day enables your health care provider to set the correct dose of warfarin. You may decide to eat 2 vitamin K-containing foods each day. Tests   Make sure to have PT tests at least  once every 4-6 weeks for the entire time you are taking warfarin.  Ask your health care provider what your target INR range is. Make sure you always know your target range. If your INR is not in your target range, your health care provider may adjust your dosage. Preventing bleeding and injury  Some common over-the-counter medicines and supplements may increase the risk of bleeding while taking warfarin. They include: ? Acetaminophen. ? Aspirin. ? NSAIDs, such as ibuprofen or naproxen. ? Vitamin E.  Avoid situations that cause bleeding. You may bleed more easily while taking warfarin. To limit bleeding, take the following actions: ? Use a softer toothbrush. ? Floss with waxed floss, not unwaxed floss. ? Shave with an electric razor, not with a blade. ? Limit your use of sharp objects. ? Avoid potentially harmful activities, such as contact sports. General instructions  Wear or carry identification that says that you are taking warfarin.  Make sure that all health care providers, including your dentist, know that you are taking warfarin.  If you need surgery, tell your health care provider that you are taking warfarin. You may have to stop taking warfarin before your surgery.  If you  plan to breastfeed or become pregnant while taking warfarin, talk with your health care provider.  Avoid alcohol, tobacco, and drugs. ? If your health care provider approves, limit alcohol intake to no more than 1 drink a day for non-pregnant women and 2 drinks a day for men. One drink equals 12 oz. of beer, 5 oz. of wine, or 1 oz. of hard liquor. ? If you change the amount of nicotine, tobacco, or alcohol you use, tell your health care provider.  Keep all follow-up visits and lab visits as told by your health care provider. This is very important because warfarin is a medicine that needs to be closely monitored. Contact a health care provider if:  You miss a dose.  You take an extra dose.  You plan  to have any kind of surgery or procedure.  You are unable to take your medicine due to nausea, vomiting, or diarrhea.  You have any major changes in your diet, or you plan to make major changes in your diet.  You start or stop any over-the-counter medicine, prescription medicine, or dietary supplement.  You become pregnant, plan to become pregnant, or think you may be pregnant.  You have menstrual periods that are heavier than usual, or unusual vaginal bleeding.  You have unusual bruising.  You lose your appetite.  You have a fever.  You have diarrhea that lasts for more than 24 hours. Get help right away if:  You develop symptoms of an allergic reaction, such as: ? Swelling of the lips, face, tongue, mouth, or throat. ? Rash. ? Itching. ? Itchy, red, swollen areas of skin (hives). ? Trouble breathing. ? Chest tightness.  You have any symptoms of stroke. BEFAST is an easy way to remember the main warning signs of stroke: ? B - Balance. Signs are dizziness, sudden trouble walking, or loss of balance. ? E - Eye. Signs are trouble seeing or a sudden change in vision. ? F - Face. Signs are sudden weakness or numbness of the face, or the face or eyelid drooping on one side. ? A - Arm. Signs are weakness or numbness in an arm. This happens suddenly and usually on one side of the body. ? S - Speech. Signs are trouble speaking, slurred speech, or trouble understanding speech. ? T - Time. Time to call emergency services. Write down the time your symptoms started.  You have other signs of stroke, such as: ? A sudden, severe headache with no known cause. ? Nausea or vomiting. ? Seizure.  You have signs or symptoms of a blood clot, such as: ? Pain or swelling in your leg or arm. ? Skin that is red or warm to the touch on your arm or leg. ? Shortness of breath or difficulty breathing. ? Chest pain. ? Unexplained fever.  You have: ? A fall or have an accident, especially if you  hit your head. ? Blood in your urine. Your urine may look reddish, pinkish, or tea-colored. ? Blood in your stool. Your stool may be black or bright red. ? Bleeding that does not stop after applying pressure to the area for 30 minutes. ? Severe pain in your joints or back. ? Purple or blue toes. ? Skin ulcers that do not go away.  You vomit blood or cough up blood. The blood may be bright red, or it may look like coffee grounds. These symptoms may represent a serious problem that is an emergency. Do not wait to see if the  symptoms will go away. Get medical help right away. Call your local emergency services (911 in the [place] Do not drive yourself to the hospital. Summary  Warfarin needs to be closely monitored with blood tests. It is very important to keep all lab visits and follow-up visits with your health care provider. Make sure you know your target INR range and your warfarin dosage.  Monitor how much vitamin K you eat every day. Try to eat the same amount every day.  Wear or carry identification that says that you are taking warfarin.  Take warfarin at the same time every day. Call your health care provider if you miss a dose or if you take an extra dose. Do not change the dosage of warfarin on your own.  Know the signs and symptoms of blood clots, bleeding, and stroke. Know when to get emergency medical help. This information is not intended to replace advice given to you by your health care provider. Make sure you discuss any questions you have with your health care provider. Document Released: 10/02/2006 Document Revised: 01/11/2017 Document Reviewed: 01/11/2017 [employer] Interactive Patient Education  2019 [employer]

## 2019-03-19 NOTE — Progress Notes (Signed)
Cardiology Office Note:    Date:  03/19/2019   ID:  Chelsea [name], DOB 12-10-1940, MRN [id number]  PCP:  Patient, No Pcp Per  Cardiologist:  Donato Schultz, MD  Electrophysiologist:  None   Referring MD: No ref. provider found     History of Present Illness:    Chelsea [name] is a 78 y.o. female with permanent atrial fibrillation on Xarelto former patient of Dr. Yevonne Pax.  Both Xarelto and Eliquis might of previously caused rash on her back but she is back on Eliquis and was doing quite well with it.  Has a prior shrimp allergy.  No chest pain fevers chills nausea vomiting shortness of breath.  Stopped Eliquis numbness in finger tips.  Felt as though she was having an allergy to Eliquis and Xarelto.  Talked her about stroke risk.  Hemoglobin 13.5, creatinine 0.56 12/25/17   Past Medical History:  Diagnosis Date  . Arthritis   . Atrial fibrillation (HCC)   . Atrial fibrillation with RVR (HCC) 03/04/2015  . CHF (congestive heart failure) (HCC)   . Hypertension     Past Surgical History:  Procedure Laterality Date  . NO PAST SURGERIES      Current Medications: Current Meds  Medication Sig  . azelastine (OPTIVAR) 0.05 % ophthalmic solution Place 1 drop into both eyes daily.  [name] diltiazem (CARDIZEM CD) 120 MG 24 hr capsule TAKE 1 CAPSULE BY MOUTH EVERY DAY  . furosemide (LASIX) 40 MG tablet Take 1 tablet (40 mg total) by mouth daily. Please schedule an appt for further refills, 1st attempt  . metoprolol succinate (TOPROL-XL) 50 MG 24 hr tablet Take 1 tablet (50 mg total) by mouth 2 (two) times daily.  . [DISCONTINUED] apixaban (ELIQUIS) 5 MG TABS tablet Take 5 mg by mouth daily.      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 Marital status: Widowed    Spouse name: Not on file  . Number of children: Not on file  . Years of education: Not on file  . Highest education level: Not on file   Occupational History  . Not on file  Social Needs  . Financial resource strain: Not on file  . Food insecurity:    Worry: Not on file    Inability: Not on file  . Transportation needs:    Medical: Not on file    Non-medical: Not on file  Tobacco Use  . Smoking status: Never Smoker  . Smokeless tobacco: Never Used  Substance and Sexual Activity  . Alcohol use: No  . Drug use: No  . Sexual activity: Never  Lifestyle  . Physical activity:    Days per week: Not on file    Minutes per session: Not on file  . Stress: Not on file  Relationships  . Social connections:    Talks on phone: Not on file    Gets together: Not on file    Attends religious service: Not on file    Active member of club or organization: Not on file    Attends meetings of clubs or organizations: Not on file    Relationship status: Not on file  Other Topics Concern  . Not on file  Social History Narrative  . Not on file     Family History: The patient's family history includes Heart failure in her father.  ROS:   Please see the history of present illness.  Positive for "allergy" to Xarelto and Eliquis.  No fevers chills nausea vomiting all other systems reviewed and are negative.  EKGs/Labs/Other Studies Reviewed:    The following studies were reviewed today:  12/15/2014 echocardiogram:  - Left ventricle: The cavity size was normal. Wall thickness was normal. Systolic function was normal. The estimated ejection fraction was in the range of 55% to 60%. Wall motion was normal; there were no regional wall motion abnormalities. - Aortic valve: There was trivial regurgitation. - Mitral valve: Mildly to moderately calcified annulus. There was mild to moderate regurgitation. - Left atrium: The atrium was moderately dilated. - Right atrium: The atrium was mildly dilated.  EKG: Her EKG reviewed, atrial fibrillation rate controlled.  Recent Labs: No results found for requested labs within  last 8760 hours.  Recent Lipid Panel    Component Value Date/Time   CHOL 199 04/23/2013 0445   TRIG 85 04/23/2013 0445   HDL 50 04/23/2013 0445   CHOLHDL 4.0 04/23/2013 0445   VLDL 17 04/23/2013 0445   LDLCALC 132 (H) 04/23/2013 0445    Physical Exam:    VS:  BP 128/78   Pulse 88   Ht 5\' 4"  (1.626 m)   Wt 181 lb (82.1 kg)   LMP  (LMP Unknown)   SpO2 96%   BMI 31.07 kg/m     Wt Readings from Last 3 Encounters:  03/19/19 181 lb (82.1 kg)  01/18/18 183 lb 6.4 oz (83.2 kg)  06/08/17 190 lb 12.8 oz (86.5 kg)     GEN:  Well nourished, well developed in no acute distress HEENT: Normal NECK: No JVD; No carotid bruits LYMPHATICS: No lymphadenopathy CARDIAC: IRREG normal rate, no murmurs, rubs, gallops RESPIRATORY:  Clear to auscultation without rales, wheezing or rhonchi  ABDOMEN: Soft, non-tender, non-distended MUSCULOSKELETAL:  No edema; No deformity  SKIN: Warm and dry NEUROLOGIC:  Alert and oriented x 3 PSYCHIATRIC:  Normal affect   ASSESSMENT:    1. Chronic atrial fibrillation   2. Essential hypertension   3. Medication intolerance    PLAN:    In order of problems listed above:  Atrial fibrillation -Currently on diltiazem and metoprolol extended release doing well rate controlled.  Heart rate 88 bpm.  Chronic anticoagulation -She is not willing to take Eliquis or Xarelto.  Previously had to encourage her to take it twice a day.  Last hemoglobin was 13.5 on 12/25/2017. - We will start her on warfarin 5 mg once a day and have her come back in 3 days to get her Coumadin checked here at the clinic.  I had a lengthy clinic conversation with her about her stroke risk.  I am concerned without her being on anticoagulation for the past 2 months that she is going to be at increased risk for stroke lifelong.  I discussed Coumadin and the implications, getting her finger stuck and checked.  She stated if I felt as though this was good for her she would take it.  Mild to  moderate mitral regurgitation -Stable, no significant shortness of breath.   Medication Adjustments/Labs and Tests Ordered: Current medicines are reviewed at length with the patient today.  Concerns regarding medicines are outlined above.  No orders of the defined types were placed in this encounter.  Meds ordered this encounter  Medications  . warfarin (COUMADIN) 5 MG tablet    Sig: Take 1 tablet (5 mg total) by mouth one time only at 6 PM for 1 dose.    Dispense:  30 tablet    Refill:  1    Patient Instructions  Medication Instructions:  Please discontinue your Eliquis and start Coumadin/Warfarin 5 mg once daily. You will need an appointment in the Coumadin Clinic in 3 to 5 days.  If you need a refill on your cardiac medications before your next appointment, please call your pharmacy.   Follow-Up: At [facility], you and your health needs are our priority.  As part of our continuing mission to provide you with exceptional heart care, we have created designated Provider Care Teams.  These Care Teams include your primary Cardiologist (physician) and Advanced Practice Providers (APPs -  Physician Assistants and Nurse Practitioners) who all work together to provide you with the care you need, when you need it. You will need a follow up appointment in 2 months with Nada Boozer, NP.  Please call our office 2 months in advance to schedule this appointment.  You may see Donato Schultz, MD or one of the following Advanced Practice Providers on your designated Care Team:   Norma Fredrickson, NP Nada Boozer, NP . Georgie Chard, NP  Thank you for choosing [facility]!!     Warfarin Coagulopathy Warfarin coagulopathy refers to bleeding that may occur as a complication of the medicine warfarin. Warfarin is a blood thinner (anticoagulant). Anticoagulants prevent dangerous blood clots. Bleeding is the most common and most serious complication of warfarin. While taking warfarin, you  will need to have blood tests (prothrombin tests, or PT tests) regularly to measure your blood clotting time. The PT test results will be reported as the International Normalized Ratio (INR). The INR tells your health care provider whether your dosage of warfarin needs to be changed. The longer it takes your blood to clot, the higher the INR. Your risk of warfarin coagulopathy increases as your INR increases. What are the causes? This condition may be caused by:  Taking too much warfarin (overdose).  Underlying medical conditions.  Changes to your diet.  Interactions with medicines, supplements, or alcohol. What are the signs or symptoms? Warfarin coagulopathy may cause bleeding from any tissue or organ. Symptoms may include:  Bleeding from the gums.  A nosebleed that is not easily stopped.  Blood in stool. This may look like bright red, dark, or black, tarry stools.  Blood in urine. This may look like pink, red, or brown urine.  Unusual bruising or bruising easily.  A cut that does not stop bleeding within 10 minutes.  Coughing up blood.  Vomiting blood.  Feeling nauseous for longer than 1 day.  Broken blood vessels in the eye (subconjunctival hemorrhage). This may look like a bright red or dark red patch on the white part of the eye.  Abdominal or back pain with or without bruising.  Sudden, severe headache.  Sudden weakness or numbness of the face, arm, or leg, especially on one side of the body.  Sudden confusion.  Difficulty speaking (aphasia) or understanding speech.  Sudden trouble seeing out of one or both eyes.  Unexpected difficulty walking.  Dizziness.  Loss of balance or coordination.  Unusual vaginal bleeding.  Swelling or pain at an injection site.  Skin scarring due to tissue death (necrosis) of fatty tissue. This may cause pain in the waist, thighs, or buttocks. This is more common among women. How is this diagnosed? This condition is  diagnosed after your health care provider places you on warfarin and then finds out how it affects your blood's ability to clot. Prothrombin  time (PT) clotting tests are used to monitor your clotting factor. These tests also help your health care provider to find the warfarin dose that is best for you. How is this treated? This condition is treated with vitamin K. Vitamin K helps the blood to clot. You may receive vitamin K every 12 hours, or as needed. You may also receive donated plasma (transfusions of fresh frozen plasma). Plasma is the liquid part of blood, and contains substances that help the blood clot. Follow these instructions at home: Medicines  Take warfarin exactly as told by your health care provider. This ensures that you avoid bleeding or clots that could result in serious injury, pain, or disability.  Take your medicine at the same time every day. If you forget to take your dose of warfarin, take it as soon as you remember on that day. If you do not remember to take it on that day, do not take an extra dose the next day.  Contact your health care provider if you miss a dose or take an extra dose. Do not change your dosage on your own to make up for missed or extra doses.  Talk with your health care provider or your pharmacist before starting or stopping any new medicines. Many prescription and over-the-counter medicines can interfere with warfarin. This includes over-the-counter vitamins, dietary supplements, herbal medicines, and pain medicines. Your warfarin dosage may need to be adjusted. Eating and drinking  It is important to maintain a normal, balanced diet while taking warfarin. Avoid major changes in your diet. If you are planning to change your diet, talk with your health care provider before making changes.  Your health care provider may recommend that you work with a diet and nutrition specialist (dietitian).  Vitamin K makes warfarin less effective. It is found in many  foods. Eat a consistent amount of foods that contain vitamin K. For example, you may decide to eat 2 vitamin K-containing foods each day. Your warfarin dose is set according to the amount of vitamin K in your blood.  Eat a consistent amount of foods that contain vitamin K. Vitamin K makes warfarin less effective, so eating the same amount each day enables your health care provider to set the correct dose of warfarin. You may decide to eat 2 vitamin K-containing foods each day. Tests   Make sure to have PT tests at least once every 4-6 weeks for the entire time you are taking warfarin.  Ask your health care provider what your target INR range is. Make sure you always know your target range. If your INR is not in your target range, your health care provider may adjust your dosage. Preventing bleeding and injury  Some common over-the-counter medicines and supplements may increase the risk of bleeding while taking warfarin. They include: ? Acetaminophen. ? Aspirin. ? NSAIDs, such as ibuprofen or naproxen. ? Vitamin E.  Avoid situations that cause bleeding. You may bleed more easily while taking warfarin. To limit bleeding, take the following actions: ? Use a softer toothbrush. ? Floss with waxed floss, not unwaxed floss. ? Shave with an electric razor, not with a blade. ? Limit your use of sharp objects. ? Avoid potentially harmful activities, such as contact sports. General instructions  Wear or carry identification that says that you are taking warfarin.  Make sure that all health care providers, including your dentist, know that you are taking warfarin.  If you need surgery, tell your health care provider that you are taking  warfarin. You may have to stop taking warfarin before your surgery.  If you plan to breastfeed or become pregnant while taking warfarin, talk with your health care provider.  Avoid alcohol, tobacco, and drugs. ? If your health care provider approves, limit  alcohol intake to no more than 1 drink a day for non-pregnant women and 2 drinks a day for men. One drink equals 12 oz. of beer, 5 oz. of wine, or 1 oz. of hard liquor. ? If you change the amount of nicotine, tobacco, or alcohol you use, tell your health care provider.  Keep all follow-up visits and lab visits as told by your health care provider. This is very important because warfarin is a medicine that needs to be closely monitored. Contact a health care provider if:  You miss a dose.  You take an extra dose.  You plan to have any kind of surgery or procedure.  You are unable to take your medicine due to nausea, vomiting, or diarrhea.  You have any major changes in your diet, or you plan to make major changes in your diet.  You start or stop any over-the-counter medicine, prescription medicine, or dietary supplement.  You become pregnant, plan to become pregnant, or think you may be pregnant.  You have menstrual periods that are heavier than usual, or unusual vaginal bleeding.  You have unusual bruising.  You lose your appetite.  You have a fever.  You have diarrhea that lasts for more than 24 hours. Get help right away if:  You develop symptoms of an allergic reaction, such as: ? Swelling of the lips, face, tongue, mouth, or throat. ? Rash. ? Itching. ? Itchy, red, swollen areas of skin (hives). ? Trouble breathing. ? Chest tightness.  You have any symptoms of stroke. BEFAST is an easy way to remember the main warning signs of stroke: ? B - Balance. Signs are dizziness, sudden trouble walking, or loss of balance. ? E - Eye. Signs are trouble seeing or a sudden change in vision. ? F - Face. Signs are sudden weakness or numbness of the face, or the face or eyelid drooping on one side. ? A - Arm. Signs are weakness or numbness in an arm. This happens suddenly and usually on one side of the body. ? S - Speech. Signs are trouble speaking, slurred speech, or trouble  understanding speech. ? T - Time. Time to call emergency services. Write down the time your symptoms started.  You have other signs of stroke, such as: ? A sudden, severe headache with no known cause. ? Nausea or vomiting. ? Seizure.  You have signs or symptoms of a blood clot, such as: ? Pain or swelling in your leg or arm. ? Skin that is red or warm to the touch on your arm or leg. ? Shortness of breath or difficulty breathing. ? Chest pain. ? Unexplained fever.  You have: ? A fall or have an accident, especially if you hit your head. ? Blood in your urine. Your urine may look reddish, pinkish, or tea-colored. ? Blood in your stool. Your stool may be black or bright red. ? Bleeding that does not stop after applying pressure to the area for 30 minutes. ? Severe pain in your joints or back. ? Purple or blue toes. ? Skin ulcers that do not go away.  You vomit blood or cough up blood. The blood may be bright red, or it may look like coffee grounds. These symptoms may represent  a serious problem that is an emergency. Do not wait to see if the symptoms will go away. Get medical help right away. Call your local emergency services (911 in the [place] Do not drive yourself to the hospital. Summary  Warfarin needs to be closely monitored with blood tests. It is very important to keep all lab visits and follow-up visits with your health care provider. Make sure you know your target INR range and your warfarin dosage.  Monitor how much vitamin K you eat every day. Try to eat the same amount every day.  Wear or carry identification that says that you are taking warfarin.  Take warfarin at the same time every day. Call your health care provider if you miss a dose or if you take an extra dose. Do not change the dosage of warfarin on your own.  Know the signs and symptoms of blood clots, bleeding, and stroke. Know when to get emergency medical help. This information is not intended to replace  advice given to you by your health care provider. Make sure you discuss any questions you have with your health care provider. Document Released: 10/02/2006 Document Revised: 01/11/2017 Document Reviewed: 01/11/2017 Elsevier Interactive Patient Education  2019 [employer].     Signed, Donato Schultz, MD  03/19/2019 2:14 PM    [facility]

## 2019-03-22 ENCOUNTER — Telehealth: Payer: Self-pay

## 2019-03-22 NOTE — Telephone Encounter (Signed)

## 2019-03-25 ENCOUNTER — Ambulatory Visit (INDEPENDENT_AMBULATORY_CARE_PROVIDER_SITE_OTHER): Payer: Medicare Other | Admitting: Pharmacist

## 2019-03-25 ENCOUNTER — Other Ambulatory Visit: Payer: Self-pay

## 2019-03-25 DIAGNOSIS — I4891 Unspecified atrial fibrillation: Secondary | ICD-10-CM | POA: Diagnosis not present

## 2019-03-25 DIAGNOSIS — Z5181 Encounter for therapeutic drug level monitoring: Secondary | ICD-10-CM

## 2019-03-25 LAB — POCT INR: INR: 1.3 — AB (ref 2.0–3.0)

## 2019-03-25 NOTE — Patient Instructions (Addendum)
Take 1.5 tablets today, then continue taking 1 tablet daily. Recheck INR in 1 week. Call Coumadin clinic with concerns #[phone].

## 2019-03-29 ENCOUNTER — Telehealth: Payer: Self-pay

## 2019-03-29 NOTE — Telephone Encounter (Signed)
Unable to lmom no vm set up 

## 2019-03-29 NOTE — Telephone Encounter (Signed)

## 2019-04-02 ENCOUNTER — Ambulatory Visit (INDEPENDENT_AMBULATORY_CARE_PROVIDER_SITE_OTHER): Payer: Medicare Other | Admitting: Pharmacist

## 2019-04-02 ENCOUNTER — Other Ambulatory Visit: Payer: Self-pay

## 2019-04-02 DIAGNOSIS — I4891 Unspecified atrial fibrillation: Secondary | ICD-10-CM | POA: Diagnosis not present

## 2019-04-02 DIAGNOSIS — Z5181 Encounter for therapeutic drug level monitoring: Secondary | ICD-10-CM

## 2019-04-02 LAB — POCT INR: INR: 1.6 — AB (ref 2.0–3.0)

## 2019-04-04 ENCOUNTER — Telehealth: Payer: Self-pay | Admitting: Pharmacist

## 2019-04-04 NOTE — Telephone Encounter (Signed)
1. COVID-19 Pre-Screening Questions:  . In the past 7 to 10 days have you had a cough,  shortness of breath, headache, congestion, fever (100 or greater) body aches, chills, sore throat, or sudden loss of taste or sense of smell? no . Have you been around anyone with known Covid 19. no . Have you been around anyone who is awaiting Covid 19 test results in the past 7 to 10 days? no . Have you been around anyone who has been exposed to Covid 19, or has mentioned symptoms of Covid 19 within the past 7 to 10 days? no   2. Pt advised of visitor restrictions (no visitors allowed except if needed to conduct the visit). Also advised to arrive at appointment time and wear a mask.   3. Patient aware of in-office visit   

## 2019-04-09 ENCOUNTER — Ambulatory Visit (INDEPENDENT_AMBULATORY_CARE_PROVIDER_SITE_OTHER): Payer: Medicare Other | Admitting: *Deleted

## 2019-04-09 ENCOUNTER — Other Ambulatory Visit: Payer: Self-pay

## 2019-04-09 DIAGNOSIS — I4891 Unspecified atrial fibrillation: Secondary | ICD-10-CM

## 2019-04-09 DIAGNOSIS — Z5181 Encounter for therapeutic drug level monitoring: Secondary | ICD-10-CM | POA: Diagnosis not present

## 2019-04-09 LAB — POCT INR: INR: 1.7 — AB (ref 2.0–3.0)

## 2019-04-09 NOTE — Patient Instructions (Signed)
Description   Take 2 tablets today. Increase warfarin dose to 1 tablet daily except for 1 and 1/2 every Tuesday and Thursday and Saturday. Recheck INR in 1 week. Call Coumadin clinic with concerns #[phone].

## 2019-04-10 ENCOUNTER — Telehealth: Payer: Self-pay

## 2019-04-10 NOTE — Telephone Encounter (Signed)

## 2019-04-11 ENCOUNTER — Other Ambulatory Visit: Payer: Self-pay | Admitting: Cardiology

## 2019-04-17 ENCOUNTER — Ambulatory Visit (INDEPENDENT_AMBULATORY_CARE_PROVIDER_SITE_OTHER): Payer: Medicare Other | Admitting: *Deleted

## 2019-04-17 ENCOUNTER — Other Ambulatory Visit: Payer: Self-pay

## 2019-04-17 DIAGNOSIS — I4891 Unspecified atrial fibrillation: Secondary | ICD-10-CM

## 2019-04-17 DIAGNOSIS — Z5181 Encounter for therapeutic drug level monitoring: Secondary | ICD-10-CM | POA: Diagnosis not present

## 2019-04-17 LAB — POCT INR: INR: 1.8 — AB (ref 2.0–3.0)

## 2019-04-17 NOTE — Patient Instructions (Addendum)
Description   Take 2 tablets today, increase warfarin dose to 1.5  tablets daily except for 1 tablet on Monday, Wednesday, and Friday. Recheck INR in 1 week. Call Coumadin clinic with concerns [phone].

## 2019-04-18 ENCOUNTER — Telehealth: Payer: Self-pay

## 2019-04-18 NOTE — Telephone Encounter (Signed)
Unable to lmom for prescreen  

## 2019-04-24 ENCOUNTER — Other Ambulatory Visit: Payer: Self-pay

## 2019-04-24 ENCOUNTER — Ambulatory Visit (INDEPENDENT_AMBULATORY_CARE_PROVIDER_SITE_OTHER): Payer: Medicare Other | Admitting: *Deleted

## 2019-04-24 DIAGNOSIS — I4891 Unspecified atrial fibrillation: Secondary | ICD-10-CM

## 2019-04-24 DIAGNOSIS — Z5181 Encounter for therapeutic drug level monitoring: Secondary | ICD-10-CM

## 2019-04-24 LAB — POCT INR: INR: 1.6 — AB (ref 2.0–3.0)

## 2019-04-24 NOTE — Patient Instructions (Signed)
Description   Take 1.5 tablet today and 2 tablets tomorrow then increase warfarin dose to 1.5  tablets daily except for 1 tablet on  Wednesday. Recheck INR in 1 week. Call Coumadin clinic with concerns [phone].

## 2019-05-01 ENCOUNTER — Ambulatory Visit (INDEPENDENT_AMBULATORY_CARE_PROVIDER_SITE_OTHER): Payer: Medicare Other | Admitting: *Deleted

## 2019-05-01 ENCOUNTER — Other Ambulatory Visit: Payer: Self-pay

## 2019-05-01 DIAGNOSIS — I4891 Unspecified atrial fibrillation: Secondary | ICD-10-CM

## 2019-05-01 DIAGNOSIS — Z5181 Encounter for therapeutic drug level monitoring: Secondary | ICD-10-CM

## 2019-05-01 LAB — POCT INR: INR: 3.2 — AB (ref 2.0–3.0)

## 2019-05-01 NOTE — Patient Instructions (Signed)
Description   Today take 1/2 tablet then continue taking 1.5  tablets daily except for 1 tablet on  Wednesday. Recheck INR in 1 week. Call Coumadin clinic with concerns [phone].

## 2019-05-02 ENCOUNTER — Telehealth: Payer: Self-pay

## 2019-05-02 NOTE — Telephone Encounter (Signed)

## 2019-05-09 ENCOUNTER — Other Ambulatory Visit: Payer: Self-pay

## 2019-05-09 ENCOUNTER — Other Ambulatory Visit: Payer: Self-pay | Admitting: Cardiology

## 2019-05-09 ENCOUNTER — Ambulatory Visit (INDEPENDENT_AMBULATORY_CARE_PROVIDER_SITE_OTHER): Payer: Medicare Other | Admitting: *Deleted

## 2019-05-09 DIAGNOSIS — Z5181 Encounter for therapeutic drug level monitoring: Secondary | ICD-10-CM

## 2019-05-09 DIAGNOSIS — I4891 Unspecified atrial fibrillation: Secondary | ICD-10-CM | POA: Diagnosis not present

## 2019-05-09 LAB — POCT INR: INR: 1.4 — AB (ref 2.0–3.0)

## 2019-05-09 NOTE — Patient Instructions (Addendum)
Description   Pt declined dosing instructions and states she will not be taking again. Message sent to Cardiologist.   Advised to take but pt states not taking anymore:Today and tomorrow take 2 tablets then continue taking 1.5  tablets daily except for 1 tablet on  Wednesday. Recheck INR in 1 wee-decline to make an appt. Call Coumadin clinic with concerns [phone].

## 2019-05-28 ENCOUNTER — Other Ambulatory Visit: Payer: Self-pay | Admitting: Cardiology

## 2019-05-31 ENCOUNTER — Other Ambulatory Visit: Payer: Self-pay | Admitting: Cardiology

## 2019-05-31 ENCOUNTER — Ambulatory Visit: Payer: Medicare Other | Admitting: Cardiology

## 2019-06-13 ENCOUNTER — Other Ambulatory Visit: Payer: Self-pay | Admitting: Cardiology

## 2019-06-13 NOTE — Telephone Encounter (Signed)
Called pt as she is overdue for an INR check and after reviewing the last Anticoagulation Encounter, it states the pt stopped taking her Coumadin and was going to reach out to Dr. Marlou Porch regarding this. After speaking with pt she states she has not been taking and will not be taking it and advised of risks of stroke and clots she stated she will talk to Dr. Marlou Porch. She states she called and did not reach him so explained that she will probably have to leave a message and the nurse will call back and not Dr. Marlou Porch and the nurse will communicate to him her needs. Pt states she will call again soon. Pt does not need refill at this time as she had on in July and has not been taking.

## 2019-06-20 ENCOUNTER — Telehealth: Payer: Self-pay | Admitting: Pharmacist

## 2019-06-20 ENCOUNTER — Other Ambulatory Visit: Payer: Self-pay | Admitting: Pharmacist

## 2019-06-20 NOTE — Telephone Encounter (Signed)
Noted - will discuss with pt at Monday anticoag visit. Hopefully she can be convinced to continue warfarin.

## 2019-06-20 NOTE — Telephone Encounter (Signed)
    Unfortunately, it appears that compliance with anticoagulation has been thoroughly attempted and she is unable to take any form.  Appreciate reiteration to her that aspirin alone does not help prevent strokes in the setting of atrial fibrillation.  Chelsea [name] understands and accepts full responsibility.  [name], MD

## 2019-06-20 NOTE — Telephone Encounter (Signed)
Received refill request for warfarin, pt is overdue and missed last appt. Called pt to schedule INR check. It was a bit hard to understand pt, however it sounds as though she stopped taking her warfarin because of side effects (was not specific and mentioned it was affecting her back and breasts?) and that she saw her PCP in [place] recently who told her to start taking 2 baby aspirins each day. Advised pt that aspirin does not prevent strokes and blood clots as well as warfarin does. Asked her to keep her appt on Monday to discuss in person to clarify details.  Of note, she previously took both Xarelto and Eliquis however Dr Marlou Porch' note mentions they may have caused a rash on her back. At one point, she had resumed Eliquis and was doing well. Then stopped again due to numbness in her fingertips and was switched to warfarin on 03/19/19.  Will route to Dr Marlou Porch in the mean time for any additional input. Follow up visit in anticoag clinic has been scheduled for Monday 8/17.

## 2019-06-20 NOTE — Progress Notes (Signed)
Received faxed refill request for warfarin at [facility] on [address], pt missed last appt and is 1 month overdue for INR check. Called pt to schedule INR check, she does not have voicemail set up. Will try again later.

## 2019-06-21 ENCOUNTER — Telehealth: Payer: Self-pay

## 2019-06-21 NOTE — Telephone Encounter (Signed)
Called and spoke with patient and confirmed virtual visit appointment and reviewed medications. I explained this is a virtual visit and she does not come into the office for it and that we will be calling her Monday for the appointment. I explained that after the virtual visit in the morning she does need to come into the office in the afternoon for her coumadin appointment. She stated her understanding.      Virtual Visit Pre-Appointment Phone Call  "(Name), I am calling you today to discuss your upcoming appointment. We are currently trying to limit exposure to the virus that causes COVID-19 by seeing patients at home rather than in the office."  1. "What is the BEST phone number to call the day of the visit?" - include this in appointment notes  2. "Do you have or have access to (through a family member/friend) a smartphone with video capability that we can use for your visit?" a. If yes - list this number in appt notes as "cell" (if different from BEST phone #) and list the appointment type as a VIDEO visit in appointment notes b. If no - list the appointment type as a PHONE visit in appointment notes  3. Confirm consent - "In the setting of the current Covid19 crisis, you are scheduled for a (phone or video) visit with your provider on (date) at (time).  Just as we do with many in-office visits, in order for you to participate in this visit, we must obtain consent.  If you'd like, I can send this to your mychart (if signed up) or email for you to review.  Otherwise, I can obtain your verbal consent now.  All virtual visits are billed to your insurance company just like a normal visit would be.  By agreeing to a virtual visit, we'd like you to understand that the technology does not allow for your provider to perform an examination, and thus may limit your provider's ability to fully assess your condition. If your provider identifies any concerns that need to be evaluated in person, we will make  arrangements to do so.  Finally, though the technology is pretty good, we cannot assure that it will always work on either your or our end, and in the setting of a video visit, we may have to convert it to a phone-only visit.  In either situation, we cannot ensure that we have a secure connection.  Are you willing to proceed?" STAFF: Did the patient verbally acknowledge consent to telehealth visit? Document YES/NO here: YES  4. Advise patient to be prepared - "Two hours prior to your appointment, go ahead and check your blood pressure, pulse, oxygen saturation, and your weight (if you have the equipment to check those) and write them all down. When your visit starts, your provider will ask you for this information. If you have an Apple Watch or Kardia device, please plan to have heart rate information ready on the day of your appointment. Please have a pen and paper handy nearby the day of the visit as well."  5. Give patient instructions for MyChart download to smartphone OR Doximity/Doxy.me as below if video visit (depending on what platform provider is using)  6. Inform patient they will receive a phone call 15 minutes prior to their appointment time (may be from unknown caller ID) so they should be prepared to answer    TELEPHONE CALL NOTE  Julianne HandlerWadia Mcmillon has been deemed a candidate for a follow-up tele-health visit to limit community  exposure during the Covid-19 pandemic. I spoke with the patient via phone to ensure availability of phone/video source, confirm preferred email & phone number, and discuss instructions and expectations.  I reminded Julianne HandlerWadia Amacher to be prepared with any vital sign and/or heart rhythm information that could potentially be obtained via home monitoring, at the time of her visit. I reminded Julianne HandlerWadia Vandemark to expect a phone call prior to her visit.  Aretha Levi, CMA 06/21/2019 11:48 AM   INSTRUCTIONS FOR DOWNLOADING THE MYCHART APP TO SMARTPHONE  - The patient must first  make sure to have activated MyChart and know their login information - If Apple, go to [employer] Store and type in MyChart in the search bar and download the app. If Android, ask patient to go to [employer] Play Store and type in WedronMyChart in the search bar and download the app. The app is free but as with any other app downloads, their phone may require them to verify saved payment information or Apple/Android password.  - The patient will need to then log into the app with their MyChart username and password, and select [facility] as their healthcare provider to link the account. When it is time for your visit, go to the MyChart app, find appointments, and click Begin Video Visit. Be sure to Select Allow for your device to access the Microphone and Camera for your visit. You will then be connected, and your provider will be with you shortly.  **If they have any issues connecting, or need assistance please contact MyChart service desk [phone])**  **If using a computer, in order to ensure the best quality for their visit they will need to use either of the following Internet Browsers: [employer] Edge, or Google Chrome**  IF USING DOXIMITY or DOXY.ME - The patient will receive a link just prior to their visit by text.     FULL LENGTH CONSENT FOR TELE-HEALTH VISIT   I hereby voluntarily request, consent and authorize [facility] and its employed or contracted physicians, physician assistants, nurse practitioners or other licensed health care professionals (the Practitioner), to provide me with telemedicine health care services (the "Services") as deemed necessary by the treating Practitioner. I acknowledge and consent to receive the Services by the Practitioner via telemedicine. I understand that the telemedicine visit will involve communicating with the Practitioner through live audiovisual communication technology and the disclosure of certain medical information by electronic transmission.  I acknowledge that I have been given the opportunity to request an in-person assessment or other available alternative prior to the telemedicine visit and am voluntarily participating in the telemedicine visit.  I understand that I have the right to withhold or withdraw my consent to the use of telemedicine in the course of my care at any time, without affecting my right to future care or treatment, and that the Practitioner or I may terminate the telemedicine visit at any time. I understand that I have the right to inspect all information obtained and/or recorded in the course of the telemedicine visit and may receive copies of available information for a reasonable fee.  I understand that some of the potential risks of receiving the Services via telemedicine include:  [name]. Delay or interruption in medical evaluation due to technological equipment failure or disruption; . Information transmitted may not be sufficient (e.g. poor resolution of images) to allow for appropriate medical decision making by the Practitioner; and/or  . In rare instances, security protocols could fail, causing a breach of personal health information.  Furthermore, I acknowledge that it is my responsibility to provide information about my medical history, conditions and care that is complete and accurate to the best of my ability. I acknowledge that Practitioner's advice, recommendations, and/or decision may be based on factors not within their control, such as incomplete or inaccurate data provided by me or distortions of diagnostic images or specimens that may result from electronic transmissions. I understand that the practice of medicine is not an exact science and that Practitioner makes no warranties or guarantees regarding treatment outcomes. I acknowledge that I will receive a copy of this consent concurrently upon execution via email to the email address I last provided but may also request a printed copy by calling the office  of [facility].    I understand that my insurance will be billed for this visit.   I have read or had this consent read to me. . I understand the contents of this consent, which adequately explains the benefits and risks of the Services being provided via telemedicine.  . I have been provided ample opportunity to ask questions regarding this consent and the Services and have had my questions answered to my satisfaction. . I give my informed consent for the services to be provided through the use of telemedicine in my medical care  By participating in this telemedicine visit I agree to the above.

## 2019-06-23 NOTE — Progress Notes (Signed)
Virtual Visit via Telephone Note   This visit type was conducted due to national recommendations for restrictions regarding the COVID-19 Pandemic (e.g. social distancing) in an effort to limit this patient's exposure and mitigate transmission in our community.  Due to her co-morbid illnesses, this patient is at least at moderate risk for complications without adequate follow up.  This format is felt to be most appropriate for this patient at this time.  The patient did not have access to video technology/had technical difficulties with video requiring transitioning to audio format only (telephone).  All issues noted in this document were discussed and addressed.  No physical exam could be performed with this format.  Please refer to the patient's chart for her  consent [facility].   Date:  06/24/2019   ID:  Chelsea HandlerWadia [name], DOB 08-02-1941, MRN [id number]  Patient Location: Home Provider Location: Home  PCP:  Patient, No Pcp Per  Cardiologist:  Donato SchultzMark Skains, MD  Electrophysiologist:  None   Evaluation Performed:  Follow-Up Visit  Chief Complaint:  Atrial fib  History of Present Illness:    Chelsea [name] is a 78 y.o. female with permanent atrial fibrillation on Xarelto former patient of Dr. Yevonne PaxBrackbill's.  Both Xarelto and Eliquis might of previously caused rash on her back but she is back on Eliquis and was doing quite well with it.  Has a prior shrimp allergy.  on coumadin for anticoagulant  Stopped Eliquis numbness in finger tips.  Felt as though she was having an allergy to Eliquis and Xarelto.  Talked her about stroke risk.  Hemoglobin 13.5, creatinine 0.56 12/25/17  Today pt tells me she stopped her coumadin, she had an allergy - she would wake up itching and had a rash.  She does not wish anticoagulant.  She stated if she died that was God's will.  We discussed stroke and possibility of paralysis and lack of speech.  Again that is what will happen, she still refuses  anticoagulant.  She is taking asa twice a day.  Again discussed this would not stop stroke.    [place] and tests were done, labs and venous u/s.   She does walk for 20 min most days.  Tries to eat healthy.  She will send us her MD in [place]'s number.       The patient does not have symptoms concerning for COVID-19 infection (fever, chills, cough, or new shortness of breath).    Past Medical History:  Diagnosis Date  . Arthritis   . Atrial fibrillation (HCC)   . Atrial fibrillation with RVR (HCC) 03/04/2015  . CHF (congestive heart failure) (HCC)   . Hypertension    Past Surgical History:  Procedure Laterality Date  . NO PAST SURGERIES       Current Meds  Medication Sig  . aspirin EC 81 MG tablet Take 162 mg by mouth daily.  [name]. diltiazem (CARDIZEM CD) 120 MG 24 hr capsule TAKE 1 CAPSULE BY MOUTH EVERY DAY  . furosemide (LASIX) 40 MG tablet TAKE 1 TABLET (40 MG TOTAL) BY MOUTH DAILY. PLEASE SCHEDULE AN APPT FOR FURTHER REFILLS, 1ST ATTEMPT  . metoprolol succinate (TOPROL-XL) 50 MG 24 hr tablet Take 1 tablet (50 mg total) by mouth 2 (two) times daily.  [name]. VITAMIN D PO Take by mouth as directed.     Allergies:   Eliquis [apixaban], Shrimp [shellfish allergy], and Xarelto [rivaroxaban]   Social History   Tobacco Use  .  Smoking status: Never Smoker  . Smokeless tobacco: Never Used  Substance Use Topics  . Alcohol use: No  . Drug use: No     Family Hx: The patient's family history includes Heart failure in her father.  ROS:   Please see the history of present illness.    General:no colds or fevers, no weight changes Skin:no rashes or ulcers HEENT:no blurred vision, no congestion CV:see HPI PUL:see HPI GI:no diarrhea constipation or melena, no indigestion GU:no hematuria, no dysuria MS:no joint pain, no claudication Neuro:no syncope, no lightheadedness Endo:no diabetes, no thyroid disease  All other systems reviewed and are negative.   Prior  CV studies:   The following studies were reviewed today:  Echo 12/15/14 Study Conclusions   - Left ventricle: The cavity size was normal. Wall thickness was  normal. Systolic function was normal. The estimated ejection  fraction was in the range of 55% to 60%. Wall motion was normal;  there were no regional wall motion abnormalities.  - Aortic valve: There was trivial regurgitation.  - Mitral valve: Mildly to moderately calcified annulus. There was  mild to moderate regurgitation.  - Left atrium: The atrium was moderately dilated.  - Right atrium: The atrium was mildly dilated.   Labs/Other Tests and Data Reviewed:    EKG:  An ECG dated 12/25/17 was personally reviewed today and demonstrated:  borderline T wave abnomrality  Recent Labs: No results found for requested labs within last 8760 hours.   Recent Lipid Panel Lab Results  Component Value Date/Time   CHOL 199 04/23/2013 04:45 AM   TRIG 85 04/23/2013 04:45 AM   HDL 50 04/23/2013 04:45 AM   CHOLHDL 4.0 04/23/2013 04:45 AM   LDLCALC 132 (H) 04/23/2013 04:45 AM    Wt Readings from Last 3 Encounters:  06/24/19 180 lb (81.6 kg)  03/19/19 181 lb (82.1 kg)  01/18/18 183 lb 6.4 oz (83.2 kg)     Objective:    Vital Signs:  Ht 5\' 3"  (1.6 m)   Wt 180 lb (81.6 kg)   LMP  (LMP Unknown)   BMI 31.89 kg/m    VITAL SIGNS:  reviewed  General NAD Lungs could speak in complete sentences without SOB Neuro A&0 X 3 follows commands answers questions approp. Psych:  Pleasant affect   ASSESSMENT & PLAN:    1. Atrial fib permanent rate controlled 2. anticoagulation pt has stopped against medical advice, she stated she developed a rash with coumadin.  Discussed change of stroke is high with paralysis and inability to speak but she said she would not take anticoagulation.   3. HTN could not take her BP today.     COVID-19 Education: The signs and symptoms of COVID-19 were discussed with the patient and how to seek care for  testing (follow up with PCP or arrange E-visit).  The importance of social distancing was discussed today.  Time:   Today, I have spent 20 minutes with the patient with telehealth technology discussing the above problems.     Medication Adjustments/Labs and Tests Ordered: Current medicines are reviewed at length with the patient today.  Concerns regarding medicines are outlined above.   Tests Ordered: No orders of the defined types were placed in this encounter.   Medication Changes: No orders of the defined types were placed in this encounter.   Follow Up:  In Person in 2 month(s)  Signed, Cecilie Kicks, NP  06/24/2019 5:09 PM    [facility]

## 2019-06-24 ENCOUNTER — Encounter: Payer: Self-pay | Admitting: Cardiology

## 2019-06-24 ENCOUNTER — Ambulatory Visit (INDEPENDENT_AMBULATORY_CARE_PROVIDER_SITE_OTHER): Payer: Medicare Other | Admitting: *Deleted

## 2019-06-24 ENCOUNTER — Other Ambulatory Visit: Payer: Self-pay

## 2019-06-24 ENCOUNTER — Telehealth (INDEPENDENT_AMBULATORY_CARE_PROVIDER_SITE_OTHER): Payer: Medicare Other | Admitting: Cardiology

## 2019-06-24 VITALS — Ht 63.0 in | Wt 180.0 lb

## 2019-06-24 DIAGNOSIS — Z9114 Patient's other noncompliance with medication regimen: Secondary | ICD-10-CM

## 2019-06-24 DIAGNOSIS — I4821 Permanent atrial fibrillation: Secondary | ICD-10-CM | POA: Diagnosis not present

## 2019-06-24 DIAGNOSIS — I10 Essential (primary) hypertension: Secondary | ICD-10-CM

## 2019-06-24 DIAGNOSIS — I4891 Unspecified atrial fibrillation: Secondary | ICD-10-CM

## 2019-06-24 DIAGNOSIS — I482 Chronic atrial fibrillation, unspecified: Secondary | ICD-10-CM

## 2019-06-24 DIAGNOSIS — Z5181 Encounter for therapeutic drug level monitoring: Secondary | ICD-10-CM

## 2019-06-24 DIAGNOSIS — Z7901 Long term (current) use of anticoagulants: Secondary | ICD-10-CM

## 2019-06-24 NOTE — Patient Instructions (Signed)
Description   Pt declined dosing instructions and states she will not be taking again. Cardiologist aware, see Karilyn Cota note.

## 2019-06-24 NOTE — Progress Notes (Signed)
Pt stated she has not been taking coumadin and dose not want to start taking coumadin. Pt is aware of the risks of not being on an anticoagulant.

## 2019-06-24 NOTE — Patient Instructions (Signed)
Medication Instructions:  Your physician recommends that you continue on your current medications as directed. Please refer to the Current Medication list given to you today.  If you need a refill on your cardiac medications before your next appointment, please call your pharmacy.   Lab work: None ordered  If you have labs (blood work) drawn today and your tests are completely normal, you will receive your results only by: [name] MyChart Message (if you have MyChart) OR . A paper copy in the mail If you have any lab test that is abnormal or we need to change your treatment, we will call you to review the results.  Testing/Procedures: None ordered  Follow-Up: At [facility], you and your health needs are our priority.  As part of our continuing mission to provide you with exceptional heart care, we have created designated Provider Care Teams.  These Care Teams include your primary Cardiologist (physician) and Advanced Practice Providers (APPs -  Physician Assistants and Nurse Practitioners) who all work together to provide you with the care you need, when you need it. You will need a follow up appointment in 2 months Lena 08/27/2019 AT 10:20 TO SEE DR. Marlou Porch   Any Other Special Instructions Will Be Listed Below (If Applicable).

## 2019-06-25 ENCOUNTER — Telehealth: Payer: Self-pay | Admitting: Licensed Clinical Social Worker

## 2019-06-25 NOTE — Telephone Encounter (Signed)
CSW referred to assist patient with obtaining a BP cuff. CSW contacted patient to inform cuff will be delivered to home. Patient grateful for support and assistance. CSW available as needed. Jackie Connell Bognar, LCSW, CCSW-MCS [phone]  

## 2019-08-27 ENCOUNTER — Ambulatory Visit: Payer: Medicare Other | Admitting: Cardiology

## 2019-08-27 NOTE — Progress Notes (Deleted)
Cardiology Office Note:    Date:  08/27/2019   ID:  Chelsea [name], DOB 1941/02/14, MRN [id number]  PCP:  Patient, No Pcp Per  Cardiologist:  Donato Schultz, MD  Electrophysiologist:  None   Referring MD: No ref. provider found     History of Present Illness:    Chelsea [name] is a 78 y.o. female with permanent atrial fibrillation on Xarelto former patient of Dr. Yevonne Pax.  Both Xarelto and Eliquis might of previously caused rash on her back but she is back on Eliquis and was doing quite well with it.  Has a prior shrimp allergy.  No chest pain fevers chills nausea vomiting shortness of breath.  Stopped Eliquis numbness in finger tips.  Felt as though she was having an allergy to Eliquis and Xarelto.  Talked her about stroke risk.  Hemoglobin 13.5, creatinine 0.56 12/25/17   Past Medical History:  Diagnosis Date  . Arthritis   . Atrial fibrillation (HCC)   . Atrial fibrillation with RVR (HCC) 03/04/2015  . CHF (congestive heart failure) (HCC)   . Hypertension     Past Surgical History:  Procedure Laterality Date  . NO PAST SURGERIES      Current Medications: No outpatient medications have been marked as taking for the 08/27/19 encounter (Appointment) with Jake Bathe, MD.     Allergies:   Eliquis [apixaban], Shrimp [shellfish allergy], and Xarelto [rivaroxaban]   Social History   Socioeconomic History  . Marital status: Widowed    Spouse name: Not on file  . Number of children: Not on file  . Years of education: Not on file  . Highest education level: Not on file  Occupational History  . Not on file  Social Needs  . Financial resource strain: Not on file  . Food insecurity    Worry: Not on file    Inability: Not on file  . Transportation needs    Medical: Not on file    Non-medical: Not on file  Tobacco Use  . Smoking status: Never Smoker  . Smokeless tobacco: Never Used  Substance and Sexual Activity  . Alcohol use: No  . Drug use: No  . Sexual  activity: Never  Lifestyle  . Physical activity    Days per week: Not on file    Minutes per session: Not on file  . Stress: Not on file  Relationships  . Social Musician on phone: Not on file    Gets together: Not on file    Attends religious service: Not on file    Active member of club or organization: Not on file    Attends meetings of clubs or organizations: Not on file    Relationship status: Not on file  Other Topics Concern  . Not on file  Social History Narrative  . Not on file     Family History: The patient's family history includes Heart failure in her father.  ROS:   Please see the history of present illness.    Positive for "allergy" to Xarelto and Eliquis.  No fevers chills nausea vomiting all other systems reviewed and are negative.  EKGs/Labs/Other Studies Reviewed:    The following studies were reviewed today:  12/15/2014 echocardiogram:  - Left ventricle: The cavity size was normal. Wall thickness was normal. Systolic function was normal. The estimated ejection fraction was in the range of 55% to 60%. Wall motion was normal; there were no regional wall motion abnormalities. - Aortic valve: There was  trivial regurgitation. - Mitral valve: Mildly to moderately calcified annulus. There was mild to moderate regurgitation. - Left atrium: The atrium was moderately dilated. - Right atrium: The atrium was mildly dilated.  EKG: Her EKG reviewed, atrial fibrillation rate controlled.  Recent Labs: No results found for requested labs within last 8760 hours.  Recent Lipid Panel    Component Value Date/Time   CHOL 199 04/23/2013 0445   TRIG 85 04/23/2013 0445   HDL 50 04/23/2013 0445   CHOLHDL 4.0 04/23/2013 0445   VLDL 17 04/23/2013 0445   LDLCALC 132 (H) 04/23/2013 0445    Physical Exam:    VS:  LMP  (LMP Unknown)     Wt Readings from Last 3 Encounters:  06/24/19 180 lb (81.6 kg)  03/19/19 181 lb (82.1 kg)  01/18/18 183 lb 6.4  oz (83.2 kg)     GEN:  Well nourished, well developed in no acute distress HEENT: Normal NECK: No JVD; No carotid bruits LYMPHATICS: No lymphadenopathy CARDIAC: IRREG normal rate, no murmurs, rubs, gallops RESPIRATORY:  Clear to auscultation without rales, wheezing or rhonchi  ABDOMEN: Soft, non-tender, non-distended MUSCULOSKELETAL:  No edema; No deformity  SKIN: Warm and dry NEUROLOGIC:  Alert and oriented x 3 PSYCHIATRIC:  Normal affect   ASSESSMENT:    No diagnosis found. PLAN:    In order of problems listed above:  Atrial fibrillation -Currently on diltiazem and metoprolol extended release doing well rate controlled.  Heart rate 88 bpm.  Chronic anticoagulation -After trying to start Coumadin last, she stated that she had not been taking it and did not want to start taking it she was aware of the risks of not taking anticoagulation.  We have also been through other agents such as Eliquis and Xarelto.  She understands the risk of strokes and takes full responsibility for this.  Unfortunately, we have had several encounters with several lengthy conversations about utilizing anticoagulation.   Mild to moderate mitral regurgitation -Stable, no significant shortness of breath.   Medication Adjustments/Labs and Tests Ordered: Current medicines are reviewed at length with the patient today.  Concerns regarding medicines are outlined above.  No orders of the defined types were placed in this encounter.  No orders of the defined types were placed in this encounter.   There are no Patient Instructions on file for this visit.   Signed, [name], MD  08/27/2019 9:35 AM    [facility]

## 2020-01-24 ENCOUNTER — Ambulatory Visit: Payer: Medicare Other | Admitting: Cardiology

## 2020-04-15 ENCOUNTER — Other Ambulatory Visit: Payer: Self-pay | Admitting: Physician Assistant

## 2020-06-02 ENCOUNTER — Other Ambulatory Visit: Payer: Self-pay | Admitting: Cardiology

## 2020-06-21 ENCOUNTER — Other Ambulatory Visit: Payer: Self-pay | Admitting: Cardiology

## 2020-06-26 ENCOUNTER — Other Ambulatory Visit: Payer: Self-pay | Admitting: Cardiology

## 2020-07-03 ENCOUNTER — Other Ambulatory Visit: Payer: Self-pay | Admitting: Cardiology

## 2020-07-03 MED ORDER — FUROSEMIDE 40 MG PO TABS: 40.0000 mg | ORAL_TABLET | Freq: Every day | ORAL | 11 refills | Status: DC

## 2020-07-03 MED ORDER — DILTIAZEM HCL ER COATED BEADS 120 MG PO CP24: 120.0000 mg | ORAL_CAPSULE | Freq: Every day | ORAL | 11 refills | Status: DC

## 2020-07-03 NOTE — Telephone Encounter (Signed)
 *  STAT* If patient is at the pharmacy, call can be transferred to refill team.   1. Which medications need to be refilled? (please list name of each medication and dose if known) BILTIAZEM 120 mg  And  FUROSEMIBE  40mg   2. Which pharmacy/location (including street and city if local pharmacy) is medication to be sent to? [facility] [address] [facility]  3. Do they need a 30 day or 90 day supply? 90

## 2020-11-30 ENCOUNTER — Other Ambulatory Visit: Payer: Self-pay

## 2020-11-30 ENCOUNTER — Emergency Department (HOSPITAL_BASED_OUTPATIENT_CLINIC_OR_DEPARTMENT_OTHER): Payer: Medicare Other

## 2020-11-30 ENCOUNTER — Encounter (HOSPITAL_BASED_OUTPATIENT_CLINIC_OR_DEPARTMENT_OTHER): Payer: Self-pay | Admitting: Emergency Medicine

## 2020-11-30 ENCOUNTER — Emergency Department (HOSPITAL_BASED_OUTPATIENT_CLINIC_OR_DEPARTMENT_OTHER)
Admission: EM | Admit: 2020-11-30 | Discharge: 2020-11-30 | Disposition: A | Discharge status: Home / Self Care | Payer: Medicare Other | Attending: Emergency Medicine | Admitting: Emergency Medicine

## 2020-11-30 DIAGNOSIS — M25512 Pain in left shoulder: Secondary | ICD-10-CM | POA: Diagnosis not present

## 2020-11-30 DIAGNOSIS — Z5321 Procedure and treatment not carried out due to patient leaving prior to being seen by health care provider: Secondary | ICD-10-CM | POA: Insufficient documentation

## 2020-11-30 DIAGNOSIS — M79604 Pain in right leg: Secondary | ICD-10-CM | POA: Insufficient documentation

## 2020-11-30 NOTE — ED Notes (Signed)
Pt advised of wait time of estimated 7 hours. Pt states she doesn't want to wait that long and will come back another time.

## 2020-11-30 NOTE — ED Triage Notes (Signed)
Pt c/o right leg pain medial knee since yesterday and left shoulder pain.

## 2020-12-01 ENCOUNTER — Emergency Department (HOSPITAL_BASED_OUTPATIENT_CLINIC_OR_DEPARTMENT_OTHER): Payer: Medicare Other

## 2020-12-01 ENCOUNTER — Encounter (HOSPITAL_BASED_OUTPATIENT_CLINIC_OR_DEPARTMENT_OTHER): Payer: Self-pay

## 2020-12-01 ENCOUNTER — Emergency Department (HOSPITAL_BASED_OUTPATIENT_CLINIC_OR_DEPARTMENT_OTHER)
Admission: EM | Admit: 2020-12-01 | Discharge: 2020-12-01 | Disposition: A | Discharge status: Home / Self Care | Payer: Medicare Other | Attending: Emergency Medicine | Admitting: Emergency Medicine

## 2020-12-01 DIAGNOSIS — M25561 Pain in right knee: Secondary | ICD-10-CM | POA: Diagnosis present

## 2020-12-01 DIAGNOSIS — Z7901 Long term (current) use of anticoagulants: Secondary | ICD-10-CM | POA: Diagnosis not present

## 2020-12-01 DIAGNOSIS — Z7982 Long term (current) use of aspirin: Secondary | ICD-10-CM | POA: Insufficient documentation

## 2020-12-01 DIAGNOSIS — M1711 Unilateral primary osteoarthritis, right knee: Secondary | ICD-10-CM

## 2020-12-01 DIAGNOSIS — Z79899 Other long term (current) drug therapy: Secondary | ICD-10-CM | POA: Insufficient documentation

## 2020-12-01 DIAGNOSIS — I5033 Acute on chronic diastolic (congestive) heart failure: Secondary | ICD-10-CM | POA: Diagnosis not present

## 2020-12-01 DIAGNOSIS — I110 Hypertensive heart disease with heart failure: Secondary | ICD-10-CM | POA: Diagnosis not present

## 2020-12-01 MED ORDER — DICLOFENAC SODIUM 1 % EX GEL: 2.0000 g | Freq: Four times a day (QID) | CUTANEOUS | 0 refills | Status: DC

## 2020-12-01 MED ORDER — ACETAMINOPHEN 500 MG PO TABS
1000.0000 mg | ORAL_TABLET | Freq: Once | ORAL | Status: AC
  Administered 2020-12-01: 1000 mg via ORAL
  Filled 2020-12-01: qty 2

## 2020-12-01 MED ORDER — LIDOCAINE 5 % EX PTCH
1.0000 | MEDICATED_PATCH | Freq: Once | CUTANEOUS | Status: DC
  Administered 2020-12-01: 1 via TRANSDERMAL
  Filled 2020-12-01: qty 1

## 2020-12-01 NOTE — ED Triage Notes (Signed)
Pt c/o right medial knee pain. Went to [facility]. Took tylenol without relief. Denies known injury

## 2020-12-01 NOTE — ED Provider Notes (Signed)
[facility] EMERGENCY DEPARTMENT Provider Note   CSN: [id number] Arrival date & time: 12/01/20  1109     History Chief Complaint  Patient presents with  . Knee Pain    Chelsea [name] is a 80 y.o. female.  Chelsea [name] is a 80 y.o. female with a history of hypertension, CHF and A. fib, who presents to the emergency department for evaluation of right knee pain.  Symptoms have been present for about 2 days.  She denies trauma associated with knee pain.  Has not noticed any swelling.  No redness or warmth.  No fevers or chills.  Reports pain is worse with movement and in particular with weightbearing.  She has not had similar pain to this degree before.  At home has used some topical creams like Vicks rub and has also tried taking 650 of Tylenol 3 times a day but reports nothing tends to give her relief.  She has an upcoming PCP appointment but reports she could not take the pain, went to urgent care and they gave her a knee brace but states that this does not help.  Pain does not radiate into her calf.  No pain at the ankle, foot or hip.  No other aggravating or alleviating factors.        Past Medical History:  Diagnosis Date  . Arthritis   . Atrial fibrillation (HCC)   . Atrial fibrillation with RVR (HCC) 03/04/2015  . CHF (congestive heart failure) (HCC)   . Hypertension     Patient Active Problem List   Diagnosis Date Noted  . Chronic anticoagulation 03/06/2015  . Edema-dopplers negative for DVT 03/06/2015  . CHF exacerbation (HCC)   . SOB (shortness of breath)   . Chest pain   . Acute on chronic diastolic congestive heart failure (HCC)   . Atrial fibrillation with RVR (HCC) 03/04/2015  . Essential hypertension 02/24/2015  . Chronic diastolic heart failure (HCC) 12/10/2014  . Permanent atrial fibrillation (HCC) 12/10/2014  . Paroxysmal nocturnal dyspnea 12/10/2014    Past Surgical History:  Procedure Laterality Date  . NO PAST SURGERIES       OB History    No obstetric history on file.     Family History  Problem Relation Age of Onset  . Heart failure Father     Social History   Tobacco Use  . Smoking status: Never Smoker  . Smokeless tobacco: Never Used  Vaping Use  . Vaping Use: Never used  Substance Use Topics  . Alcohol use: No  . Drug use: No    Home Medications Prior to Admission medications   Medication Sig Start Date End Date Taking? Authorizing Provider  aspirin EC 81 MG tablet Take 162 mg by mouth daily.    [provider]  diltiazem (CARDIZEM CD) 120 MG 24 hr capsule Take 1 capsule (120 mg total) by mouth daily. Please schedule appt 1st attempt 07/03/20   Jake Bathe, MD  furosemide (LASIX) 40 MG tablet Take 1 tablet (40 mg total) by mouth daily. Please schedule an appt for further refills, 1st attempt 07/03/20   Jake Bathe, MD  metoprolol succinate (TOPROL-XL) 50 MG 24 hr tablet TAKE 1 TABLET BY MOUTH TWICE A DAY 06/23/20   Jake Bathe, MD  VITAMIN D PO Take by mouth as directed.    [provider]  warfarin (COUMADIN) 5 MG tablet TAKE AS DIRECTED BY THE COUMADIN CLINIC Patient not taking: Reported on 06/21/2019 05/09/19   Anne Fu,  Veverly Fells, MD    Allergies    Eliquis [apixaban], Shrimp [shellfish allergy], and Xarelto [rivaroxaban]  Review of Systems   Review of Systems  Constitutional: Negative for chills and fever.  Respiratory: Negative for shortness of breath.   Cardiovascular: Negative for chest pain.  Musculoskeletal: Positive for arthralgias. Negative for joint swelling.  Skin: Negative for color change, rash and wound.  Neurological: Negative for weakness and numbness.  All other systems reviewed and are negative.   Physical Exam Updated Vital Signs BP (!) 128/96 (BP Location: Right Arm)   Pulse 96   Temp 97.9 F (36.6 C) (Oral)   Resp 20   Ht 5\' 3"  (1.6 m)   Wt 81.6 kg   LMP  (LMP Unknown)   SpO2 96%   BMI 31.87 kg/m   Physical Exam Vitals and nursing note  reviewed.  Constitutional:      General: She is not in acute distress.    Appearance: Normal appearance. She is well-developed and well-nourished. She is not ill-appearing or diaphoretic.     Comments: Elderly female, well-appearing and in no acute distress.  HENT:     Head: Normocephalic and atraumatic.  Eyes:     General:        Right eye: No discharge.        Left eye: No discharge.  Pulmonary:     Effort: Pulmonary effort is normal. No respiratory distress.  Musculoskeletal:        General: Tenderness present.     Cervical back: Neck supple.     Comments: Tenderness to palpation over the right knee primarily over the medial joint line without palpable deformity or swelling, no overlying erythema or warmth, patient able to flex and extend the knee greater than 90 degrees with some discomfort, no tenderness or pain over the calf, ankle or foot.  2+ DP and TP pulses, normal sensation and strength.  Skin:    General: Skin is warm and dry.  Neurological:     Mental Status: She is alert and oriented to person, place, and time.     Coordination: Coordination normal.  Psychiatric:        Mood and Affect: Mood and affect and mood normal.        Behavior: Behavior normal.     ED Results / Procedures / Treatments   Labs (all labs ordered are listed, but only abnormal results are displayed) Labs Reviewed - No data to display  EKG None  Radiology DG Knee Complete 4 Views Right  Result Date: 12/01/2020 CLINICAL DATA:  Knee pain.  No known injury. EXAM: RIGHT KNEE - COMPLETE 4+ VIEW COMPARISON:  No prior. FINDINGS: Diffuse tricompartment degenerative change. Chondrocalcinosis noted. Loose bodies noted over the popliteal space. Corticated bony density noted along the superior patella most likely old fracture fragment and or degenerative change. No acute bony or joint abnormality. No evidence of fracture or dislocation. Peripheral vascular calcification. IMPRESSION: 1. Diffuse  tricompartment degenerative change. Chondrocalcinosis and loose bodies noted. No acute abnormality identified. 2.  Peripheral vascular disease. Electronically Signed   By: 12/03/2020  Register   On: 12/01/2020 12:23    Procedures Procedures   Medications Ordered in ED Medications  acetaminophen (TYLENOL) tablet 1,000 mg (has no administration in time range)  lidocaine (LIDODERM) 5 % 1 patch (has no administration in time range)    ED Course  I have reviewed the triage vital signs and the nursing notes.  Pertinent labs & imaging results that were  available during my care of the patient were reviewed by me and considered in my medical decision making (see chart for details).    MDM Rules/Calculators/A&P                          80 year old female presents with 2 days of atraumatic right knee pain.  Pain is worse with movement, especially knee bending.  Pain is most severe over the medial joint line.  No associated swelling or redness.  No fevers or chills.  No concern for septic arthritis.  Pain does not extend into the calf and patient is anticoagulated, no concern for DVT.  X-ray of the knee shows diffuse tricompartmental degenerative changes and chondrocalcinosis consistent with osteoarthritis which certainly makes sense with patient's description of pain.  Distal pulses intact in the right lower extremity is neurovascularly intact.  Patient has intermittently been taking Tylenol and using Vicks vapor rub without much improvement.  Will treat with Voltaren gel, continue Tylenol 1000 mg every 6 hours and have patient follow-up closely with her PCP, will also refer to sports medicine.  I called and discussed these instructions with patient's daughter as well who expresses understanding.  Pain treated here in the emergency department.  Patient stable for discharge home.  Patient discussed with Dr. Manus Gunning, who saw patient as well and agrees with plan.  Final Clinical Impression(s) / ED  Diagnoses Final diagnoses:  Acute pain of right knee  Osteoarthritis of right knee, unspecified osteoarthritis type    Rx / DC Orders ED Discharge Orders    None       Legrand Rams 12/01/20 1558    Glynn Octave, MD 12/01/20 [phone]

## 2020-12-01 NOTE — ED Notes (Signed)
Lrg ice bag applied ?

## 2020-12-01 NOTE — Discharge Instructions (Addendum)
Your x-ray shows severe arthritis in the knee that is likely the cause of your pain. This arthritis comes from wear and tear on the joint over time it can cause pain and inflammation. To help treat this take Tylenol 1000 mg every 6 hours, this is a safe and appropriate dose of Tylenol. You can also use Voltaren gel on the knee as prescribed. You can continue to use brace for support. It is important that you follow-up and discuss this knee pain with your primary care doctor, you can also follow-up with Dr. Jordan Likes with sports medicine or your PCP may choose to refer you to an orthopedist for continued management of this arthritis. If you develop redness, swelling, fevers, significantly worsened pain or new leg pain you can return for reevaluation.

## 2020-12-02 ENCOUNTER — Telehealth: Payer: Self-pay | Admitting: Family Medicine

## 2020-12-02 NOTE — Telephone Encounter (Signed)
Disregard..message.Called patient called while typing & scheduled appt for tomorrow.  --glh

## 2020-12-03 ENCOUNTER — Ambulatory Visit (INDEPENDENT_AMBULATORY_CARE_PROVIDER_SITE_OTHER): Payer: Medicare Other | Admitting: Family Medicine

## 2020-12-03 ENCOUNTER — Ambulatory Visit: Payer: Self-pay

## 2020-12-03 ENCOUNTER — Other Ambulatory Visit: Payer: Self-pay

## 2020-12-03 VITALS — BP 120/70 | Ht 64.0 in | Wt 186.0 lb

## 2020-12-03 DIAGNOSIS — M25561 Pain in right knee: Secondary | ICD-10-CM

## 2020-12-03 DIAGNOSIS — M1711 Unilateral primary osteoarthritis, right knee: Secondary | ICD-10-CM | POA: Diagnosis present

## 2020-12-03 MED ORDER — TRIAMCINOLONE ACETONIDE 40 MG/ML IJ SUSP
40.0000 mg | Freq: Once | INTRAMUSCULAR | Status: AC
  Administered 2020-12-03: 40 mg via INTRA_ARTICULAR

## 2020-12-03 NOTE — Progress Notes (Signed)
Chelsea [name] - 80 y.o. female MRN [id number]  Date of birth: 09/22/1941  SUBJECTIVE:  Including CC & ROS.  No chief complaint on file.   Chelsea [name] is a 80 y.o. female that is presenting with acute right knee pain.  Pain is severe in nature.  It is medial aspect.  No injury or inciting event.  No history of surgery.  No improvement with Tylenol..  Independent review of the right knee x-ray from 1/25 shows severe degenerative changes of the medial compartment.   Review of Systems See HPI   HISTORY: Past Medical, Surgical, Social, and Family History Reviewed & Updated per EMR.   Pertinent Historical Findings include:  Past Medical History:  Diagnosis Date  . Arthritis   . Atrial fibrillation (HCC)   . Atrial fibrillation with RVR (HCC) 03/04/2015  . CHF (congestive heart failure) (HCC)   . Hypertension     Past Surgical History:  Procedure Laterality Date  . NO PAST SURGERIES      Family History  Problem Relation Age of Onset  . Heart failure Father     Social History   Socioeconomic History  . Marital status: Widowed    Spouse name: Not on file  . Number of children: Not on file  . Years of education: Not on file  . Highest education level: Not on file  Occupational History  . Not on file  Tobacco Use  . Smoking status: Never Smoker  . Smokeless tobacco: Never Used  Vaping Use  . Vaping Use: Never used  Substance and Sexual Activity  . Alcohol use: No  . Drug use: No  . Sexual activity: Never  Other Topics Concern  . Not on file  Social History Narrative  . Not on file   Social Determinants of Health   Financial Resource Strain: Not on file  Food Insecurity: Not on file  Transportation Needs: Not on file  Physical Activity: Not on file  Stress: Not on file  Social Connections: Not on file  Intimate Partner Violence: Not on file     PHYSICAL EXAM:  VS: BP 120/70   Ht 5\' 4"  (1.626 m)   Wt 186 lb (84.4 kg)   LMP  (LMP Unknown)   BMI 31.93  kg/m  Physical Exam Gen: NAD, alert, cooperative with exam, well-appearing MSK:  Right knee: Mild effusion. Normal range of motion. Tenderness over the medial joint. Neurovascular intact  Limited ultrasound: Right knee:  Mild effusion. Normal-appearing quadricep and patellar tendon. Severe degenerative changes with degenerative meniscus and effusion noted in the medial compartment.   Increased hyperemia of the fat pad.  Summary: Findings demonstrate osteoarthritis.  Ultrasound and interpretation by , MD   Aspiration/Injection Procedure Note Chelsea [name] 06/06/1941  Procedure: Injection Indications: Right knee pain  Procedure Details Consent: Risks of procedure as well as the alternatives and risks of each were explained to the (patient/caregiver).  Consent for procedure obtained. Time Out: Verified patient identification, verified procedure, site/side was marked, verified correct patient position, special equipment/implants available, medications/allergies/relevent history reviewed, required imaging and test results available.  Performed.  The area was cleaned with iodine and alcohol swabs.    The Right knee superior lateral circular pouch was injected using 5 cc of 1% lidocaine on a 22-gauge 1-1/2 inch needle.  The syringe was switched to mixture containing 1 cc's of 40 mg Kenalog and 4 cc's of 0.25% bupivacaine was injected.  Ultrasound was used. Images were obtained in long views showing the  injection.     A sterile dressing was applied.  Patient did tolerate procedure well.    ASSESSMENT & PLAN:   Primary osteoarthritis of right knee Findings demonstrate acute exacerbation of underlying degenerative changes.  May have had pad impingement as well given ultrasound findings. -Counseled on home exercise therapy and supportive care. -Injection. -Could consider gel injections or physical therapy.

## 2020-12-03 NOTE — Patient Instructions (Signed)
Nice to meet you Please try ice as needed  Please try the exercises   Please send me a message in MyChart with any questions or updates.  Please see me back in 4 weeks.   --Dr. Mckynlie Vanderslice  

## 2020-12-03 NOTE — Assessment & Plan Note (Signed)
Findings demonstrate acute exacerbation of underlying degenerative changes.  May have had pad impingement as well given ultrasound findings. -Counseled on home exercise therapy and supportive care. -Injection. -Could consider gel injections or physical therapy.

## 2020-12-19 ENCOUNTER — Other Ambulatory Visit: Payer: Self-pay | Admitting: Cardiology

## 2021-01-05 ENCOUNTER — Ambulatory Visit: Payer: Medicare Other | Admitting: Family Medicine

## 2021-02-04 ENCOUNTER — Emergency Department (HOSPITAL_BASED_OUTPATIENT_CLINIC_OR_DEPARTMENT_OTHER): Payer: Medicare Other

## 2021-02-04 ENCOUNTER — Other Ambulatory Visit: Payer: Self-pay

## 2021-02-04 ENCOUNTER — Encounter (HOSPITAL_BASED_OUTPATIENT_CLINIC_OR_DEPARTMENT_OTHER): Payer: Self-pay

## 2021-02-04 ENCOUNTER — Emergency Department (HOSPITAL_BASED_OUTPATIENT_CLINIC_OR_DEPARTMENT_OTHER)
Admission: EM | Admit: 2021-02-04 | Discharge: 2021-02-04 | Disposition: A | Discharge status: Home / Self Care | Payer: Medicare Other | Attending: Emergency Medicine | Admitting: Emergency Medicine

## 2021-02-04 DIAGNOSIS — Z79899 Other long term (current) drug therapy: Secondary | ICD-10-CM | POA: Insufficient documentation

## 2021-02-04 DIAGNOSIS — I110 Hypertensive heart disease with heart failure: Secondary | ICD-10-CM | POA: Diagnosis not present

## 2021-02-04 DIAGNOSIS — I5033 Acute on chronic diastolic (congestive) heart failure: Secondary | ICD-10-CM | POA: Diagnosis not present

## 2021-02-04 DIAGNOSIS — R2242 Localized swelling, mass and lump, left lower limb: Secondary | ICD-10-CM | POA: Diagnosis not present

## 2021-02-04 DIAGNOSIS — L03116 Cellulitis of left lower limb: Secondary | ICD-10-CM | POA: Insufficient documentation

## 2021-02-04 DIAGNOSIS — M25572 Pain in left ankle and joints of left foot: Secondary | ICD-10-CM | POA: Diagnosis present

## 2021-02-04 DIAGNOSIS — Z7982 Long term (current) use of aspirin: Secondary | ICD-10-CM | POA: Diagnosis not present

## 2021-02-04 MED ORDER — MORPHINE SULFATE (PF) 4 MG/ML IV SOLN
4.0000 mg | Freq: Once | INTRAVENOUS | Status: AC
  Administered 2021-02-04: 4 mg via INTRAMUSCULAR
  Filled 2021-02-04: qty 1

## 2021-02-04 MED ORDER — DOXYCYCLINE HYCLATE 100 MG PO TABS
100.0000 mg | ORAL_TABLET | Freq: Once | ORAL | Status: AC
  Administered 2021-02-04: 100 mg via ORAL
  Filled 2021-02-04: qty 1

## 2021-02-04 MED ORDER — HYDROCODONE-ACETAMINOPHEN 5-325 MG PO TABS: 1.0000 | ORAL_TABLET | ORAL | 0 refills | Status: DC | PRN

## 2021-02-04 MED ORDER — DOXYCYCLINE HYCLATE 100 MG PO CAPS: 100.0000 mg | ORAL_CAPSULE | Freq: Two times a day (BID) | ORAL | 0 refills | Status: DC

## 2021-02-04 NOTE — ED Provider Notes (Signed)
[facility] EMERGENCY DEPARTMENT Provider Note   CSN: [id number] Arrival date & time: 02/04/21  1107     History Chief Complaint  Patient presents with  . Ankle Pain    Chelsea [name] is a 80 y.o. female.  Pt presents to the ED today with left ankle pain.  Pt said she woke up yesterday with some redness and swelling to the left ankle.  No trauma.  Pt is on coumadin for afib.  No hx dvt.          Past Medical History:  Diagnosis Date  . Arthritis   . Atrial fibrillation (HCC)   . Atrial fibrillation with RVR (HCC) 03/04/2015  . CHF (congestive heart failure) (HCC)   . Hypertension     Patient Active Problem List   Diagnosis Date Noted  . Primary osteoarthritis of right knee 12/03/2020  . Chronic anticoagulation 03/06/2015  . Edema-dopplers negative for DVT 03/06/2015  . CHF exacerbation (HCC)   . SOB (shortness of breath)   . Chest pain   . Acute on chronic diastolic congestive heart failure (HCC)   . Atrial fibrillation with RVR (HCC) 03/04/2015  . Essential hypertension 02/24/2015  . Chronic diastolic heart failure (HCC) 12/10/2014  . Permanent atrial fibrillation (HCC) 12/10/2014  . Paroxysmal nocturnal dyspnea 12/10/2014    Past Surgical History:  Procedure Laterality Date  . NO PAST SURGERIES       OB History   No obstetric history on file.     Family History  Problem Relation Age of Onset  . Heart failure Father     Social History   Tobacco Use  . Smoking status: Never Smoker  . Smokeless tobacco: Never Used  Vaping Use  . Vaping Use: Never used  Substance Use Topics  . Alcohol use: No  . Drug use: No    Home Medications Prior to Admission medications   Medication Sig Start Date End Date Taking? Authorizing Provider  doxycycline (VIBRAMYCIN) 100 MG capsule Take 1 capsule (100 mg total) by mouth 2 (two) times daily. 02/04/21  Yes Jacalyn Lefevre, MD  HYDROcodone-acetaminophen (NORCO/VICODIN) 5-325 MG tablet Take 1 tablet by  mouth every 4 (four) hours as needed. 02/04/21  Yes Jacalyn Lefevre, MD  aspirin EC 81 MG tablet Take 162 mg by mouth daily.    [provider]  diclofenac Sodium (VOLTAREN) 1 % GEL Apply 2 g topically 4 (four) times daily. 12/01/20   Dartha Lodge, PA-C  diltiazem (CARDIZEM CD) 120 MG 24 hr capsule Take 1 capsule (120 mg total) by mouth daily. Please schedule appt 1st attempt 07/03/20   Jake Bathe, MD  furosemide (LASIX) 40 MG tablet Take 1 tablet (40 mg total) by mouth daily. Please schedule an appt for further refills, 1st attempt 07/03/20   Jake Bathe, MD  metoprolol succinate (TOPROL-XL) 50 MG 24 hr tablet TAKE 1 TABLET BY MOUTH TWICE A DAY 06/23/20   Jake Bathe, MD  VITAMIN D PO Take by mouth as directed.    [provider]  warfarin (COUMADIN) 5 MG tablet TAKE AS DIRECTED BY THE COUMADIN CLINIC Patient not taking: Reported on 06/21/2019 05/09/19   Jake Bathe, MD    Allergies    Eliquis [apixaban], Shrimp [shellfish allergy], Penicillins, and Xarelto [rivaroxaban]  Review of Systems   Review of Systems  Musculoskeletal:       Left ankle pain  All other systems reviewed and are negative.   Physical Exam Updated Vital  Signs BP 124/71 (BP Location: Right Arm)   Pulse 87   Temp 98.4 F (36.9 C) (Oral)   Resp 16   Ht 5\' 4"  (1.626 m)   Wt 84.8 kg   LMP  (LMP Unknown)   SpO2 96%   BMI 32.10 kg/m   Physical Exam Vitals and nursing note reviewed.  Constitutional:      Appearance: Normal appearance.  HENT:     Head: Normocephalic and atraumatic.     Right Ear: External ear normal.     Left Ear: External ear normal.     Nose: Nose normal.     Mouth/Throat:     Mouth: Mucous membranes are moist.     Pharynx: Oropharynx is clear.  Eyes:     Extraocular Movements: Extraocular movements intact.     Conjunctiva/sclera: Conjunctivae normal.     Pupils: Pupils are equal, round, and reactive to light.  Cardiovascular:     Rate and Rhythm: Normal rate  and regular rhythm.     Pulses: Normal pulses.     Heart sounds: Normal heart sounds.  Pulmonary:     Effort: Pulmonary effort is normal.     Breath sounds: Normal breath sounds.  Abdominal:     General: Abdomen is flat. Bowel sounds are normal.     Palpations: Abdomen is soft.  Musculoskeletal:     Cervical back: Normal range of motion and neck supple.     Comments: Mild swelling to left ankle.  Mild redness.  Skin:    General: Skin is warm.     Capillary Refill: Capillary refill takes less than 2 seconds.  Neurological:     General: No focal deficit present.     Mental Status: She is alert and oriented to person, place, and time.     ED Results / Procedures / Treatments   Labs (all labs ordered are listed, but only abnormal results are displayed) Labs Reviewed - No data to display  EKG None  Radiology DG Ankle Complete Left  Result Date: 02/04/2021 CLINICAL DATA:  Acute left ankle pain and swelling without known injury. EXAM: LEFT ANKLE COMPLETE - 3+ VIEW COMPARISON:  None. FINDINGS: There is no evidence of fracture, dislocation, or joint effusion. There is no evidence of arthropathy or other focal bone abnormality. Soft tissues are unremarkable. IMPRESSION: Negative. Electronically Signed   By: 02/06/2021 M.D.   On: 02/04/2021 11:55   02/06/2021 Venous Img Lower Unilateral Left  Result Date: 02/04/2021 CLINICAL DATA:  80 year old female with left lower extremity swelling. EXAM: LEFT LOWER EXTREMITY VENOUS DOPPLER ULTRASOUND TECHNIQUE: Gray-scale sonography with graded compression, as well as color Doppler and duplex ultrasound were performed to evaluate the left lower extremity deep venous systems from the level of the common femoral vein and including the common femoral, femoral, profunda femoral, popliteal and calf veins including the posterior tibial, peroneal and gastrocnemius veins when visible. Spectral Doppler was utilized to evaluate flow at rest and with distal  augmentation maneuvers in the common femoral, femoral and popliteal veins. The contralateral common femoral vein was also evaluated for comparison. COMPARISON:  None. FINDINGS: LEFT LOWER EXTREMITY Common Femoral Vein: No evidence of thrombus. Normal compressibility, respiratory phasicity and response to augmentation. Central Greater Saphenous Vein: No evidence of thrombus. Normal compressibility and flow on color Doppler imaging. Central Profunda Femoral Vein: No evidence of thrombus. Normal compressibility and flow on color Doppler imaging. Femoral Vein: No evidence of thrombus. Normal compressibility, respiratory phasicity and response to  augmentation. Popliteal Vein: No evidence of thrombus. Normal compressibility, respiratory phasicity and response to augmentation. Calf Veins: No evidence of thrombus. Normal compressibility and flow on color Doppler imaging. Other Findings:  None. RIGHT LOWER EXTREMITY Common Femoral Vein: No evidence of thrombus. Normal compressibility, respiratory phasicity and response to augmentation. IMPRESSION: No evidence of left lower extremity deep venous thrombosis. Marliss Coots, MD [facility] Electronically Signed   By: Marliss Coots MD   On: 02/04/2021 12:21    Procedures Procedures   Medications Ordered in ED Medications  morphine 4 MG/ML injection 4 mg (has no administration in time range)  doxycycline (VIBRA-TABS) tablet 100 mg (has no administration in time range)    ED Course  I have reviewed the triage vital signs and the nursing notes.  Pertinent labs & imaging results that were available during my care of the patient were reviewed by me and considered in my medical decision making (see chart for details).    MDM Rules/Calculators/A&P                          Pt does not eat meat and does not drink, so I don't think it is gout.  She has no DVT and no significant arthritis on xray.  She likely has  cellulitis.  She starts Ramadan tomorrow, so she will be put on doxy and lortab to take before and after sunrise/sunset.  Return if worse.  Final Clinical Impression(s) / ED Diagnoses Final diagnoses:  Cellulitis of left lower extremity    Rx / DC Orders ED Discharge Orders         Ordered    doxycycline (VIBRAMYCIN) 100 MG capsule  2 times daily        02/04/21 1239    HYDROcodone-acetaminophen (NORCO/VICODIN) 5-325 MG tablet  Every 4 hours PRN        02/04/21 1239           Jacalyn Lefevre, MD 02/04/21 1240

## 2021-02-04 NOTE — ED Notes (Signed)
ED Provider at bedside. 

## 2021-02-04 NOTE — ED Triage Notes (Signed)
Pt c/o L ankle pain with swelling that started yesterday. Pt denies injury.

## 2021-02-23 ENCOUNTER — Ambulatory Visit: Payer: Self-pay

## 2021-02-23 ENCOUNTER — Ambulatory Visit (INDEPENDENT_AMBULATORY_CARE_PROVIDER_SITE_OTHER): Payer: Medicare Other | Admitting: Family Medicine

## 2021-02-23 ENCOUNTER — Encounter: Payer: Self-pay | Admitting: Family Medicine

## 2021-02-23 ENCOUNTER — Other Ambulatory Visit: Payer: Self-pay

## 2021-02-23 VITALS — BP 132/78 | Ht 64.0 in | Wt 187.0 lb

## 2021-02-23 DIAGNOSIS — M25872 Other specified joint disorders, left ankle and foot: Secondary | ICD-10-CM

## 2021-02-23 DIAGNOSIS — M1711 Unilateral primary osteoarthritis, right knee: Secondary | ICD-10-CM | POA: Diagnosis not present

## 2021-02-23 DIAGNOSIS — M1712 Unilateral primary osteoarthritis, left knee: Secondary | ICD-10-CM

## 2021-02-23 DIAGNOSIS — M25562 Pain in left knee: Secondary | ICD-10-CM

## 2021-02-23 NOTE — Assessment & Plan Note (Signed)
Still having some pain in the knee after the steroid injection. -Pursue gel injections.

## 2021-02-23 NOTE — Patient Instructions (Signed)
Good to see you  Please try ice as needed  Please try the exercises  Please try the insoles  Please try the pennsaid.  Please send me a message in MyChart with any questions or updates.  We'll call you once the gel injection is in. .   --Dr. Jordan Likes

## 2021-02-23 NOTE — Assessment & Plan Note (Signed)
Symptoms seem most suggestive of impingement with changes at the lateral ankle. -Counseled on exercise therapy and supportive care. -Green sport insoles. -Could consider injection or physical therapy.

## 2021-02-23 NOTE — Progress Notes (Signed)
Chelsea [name] - 80 y.o. female MRN [id number]  Date of birth: 07-27-1941  SUBJECTIVE:  Including CC & ROS.  No chief complaint on file.   Chelsea [name] is a 80 y.o. female that is following up for her right knee pain.  Presenting with new left knee pain and left ankle pain.  The right knee is still giving her some pain intermittently after the steroid injection.  Left knee and left ankle pain occurring intermittently.   Review of Systems See HPI   HISTORY: Past Medical, Surgical, Social, and Family History Reviewed & Updated per EMR.   Pertinent Historical Findings include:  Past Medical History:  Diagnosis Date  . Arthritis   . Atrial fibrillation (HCC)   . Atrial fibrillation with RVR (HCC) 03/04/2015  . CHF (congestive heart failure) (HCC)   . Hypertension     Past Surgical History:  Procedure Laterality Date  . NO PAST SURGERIES      Family History  Problem Relation Age of Onset  . Heart failure Father     Social History   Socioeconomic History  . Marital status: Widowed    Spouse name: Not on file  . Number of children: Not on file  . Years of education: Not on file  . Highest education level: Not on file  Occupational History  . Not on file  Tobacco Use  . Smoking status: Never Smoker  . Smokeless tobacco: Never Used  Vaping Use  . Vaping Use: Never used  Substance and Sexual Activity  . Alcohol use: No  . Drug use: No  . Sexual activity: Never  Other Topics Concern  . Not on file  Social History Narrative  . Not on file   Social Determinants of Health   Financial Resource Strain: Not on file  Food Insecurity: Not on file  Transportation Needs: Not on file  Physical Activity: Not on file  Stress: Not on file  Social Connections: Not on file  Intimate Partner Violence: Not on file     PHYSICAL EXAM:  VS: BP 132/78 (BP Location: Left Arm, Patient Position: Sitting, Cuff Size: Large)   Ht 5\' 4"  (1.626 m)   Wt 187 lb (84.8 kg)   LMP  (LMP  Unknown)   BMI 32.10 kg/m  Physical Exam Gen: NAD, alert, cooperative with exam, well-appearing MSK:  Left knee: No obvious effusion. Normal range of motion. Left ankle: Effusion over the lateral malleolus. Neurovascular intact  Limited ultrasound: Left knee, left ankle:  Left knee: Mild effusion. Normal-appearing quadricep patellar tendon. Moderate to severe medial joint space narrowing. Moderate to severe lateral joint space narrowing with hyperemia and effusion of this area  Left ankle: No ankle joint effusion. Increased hyperemia at the distal fibula to suggest impingement in this area. Normal-appearing peroneal tendons.  Summary: Left knee with degenerative changes.  Left ankle with suggestions of impingement  Ultrasound and interpretation by , MD    ASSESSMENT & PLAN:   Primary osteoarthritis of left knee Acute on chronic in nature.  Does have degenerative changes of the meniscus and joint space.  Having more changes laterally.  Has a history of heart failure and A. fib so avoiding anti-inflammatories. -Counseled on home exercise therapy and supportive care. -Provided Pennsaid samples. -Could try injection or physical therapy.  Ankle impingement syndrome, left Symptoms seem most suggestive of impingement with changes at the lateral ankle. -Counseled on exercise therapy and supportive care. -Green sport insoles. -Could consider injection or physical therapy.  Primary  osteoarthritis of right knee Still having some pain in the knee after the steroid injection. -Pursue gel injections.

## 2021-02-23 NOTE — Assessment & Plan Note (Signed)
Acute on chronic in nature.  Does have degenerative changes of the meniscus and joint space.  Having more changes laterally.  Has a history of heart failure and A. fib so avoiding anti-inflammatories. -Counseled on home exercise therapy and supportive care. -Provided Pennsaid samples. -Could try injection or physical therapy.

## 2021-02-23 NOTE — Progress Notes (Signed)
Medication Samples have been provided to the patient.  Drug name: Pennsaid     Strength: 2%        Qty: 2 boxes  LOT: [id number]  Exp.Date: 04/07/2022  Dosing instructions:  use a pea sized amount on affected are twice daily  The patient has been instructed regarding the correct time, dose, and frequency of taking this medication, including desired effects and most common side effects.   Chelsea [name] 12:47 PM 02/23/2021

## 2021-03-20 ENCOUNTER — Other Ambulatory Visit: Payer: Self-pay | Admitting: Cardiology

## 2021-08-05 ENCOUNTER — Other Ambulatory Visit: Payer: Self-pay

## 2021-08-05 MED ORDER — FUROSEMIDE 40 MG PO TABS: 40.0000 mg | ORAL_TABLET | Freq: Every day | ORAL | 0 refills | Status: DC

## 2021-08-05 MED ORDER — DILTIAZEM HCL ER COATED BEADS 120 MG PO CP24: 120.0000 mg | ORAL_CAPSULE | Freq: Every day | ORAL | 0 refills | Status: DC

## 2021-08-13 ENCOUNTER — Other Ambulatory Visit: Payer: Self-pay | Admitting: Cardiology

## 2021-10-27 ENCOUNTER — Other Ambulatory Visit: Payer: Self-pay | Admitting: Cardiology

## 2022-02-10 ENCOUNTER — Ambulatory Visit: Payer: Medicare Other | Admitting: Cardiology

## 2022-02-11 ENCOUNTER — Ambulatory Visit: Payer: Medicare Other | Admitting: Cardiology

## 2022-06-02 ENCOUNTER — Encounter: Payer: Self-pay | Admitting: Cardiology

## 2022-06-02 ENCOUNTER — Ambulatory Visit (INDEPENDENT_AMBULATORY_CARE_PROVIDER_SITE_OTHER): Payer: Medicare Other | Admitting: Cardiology

## 2022-06-02 VITALS — BP 124/76 | HR 83 | Ht 64.0 in | Wt 189.0 lb

## 2022-06-02 DIAGNOSIS — I4821 Permanent atrial fibrillation: Secondary | ICD-10-CM | POA: Diagnosis not present

## 2022-06-02 DIAGNOSIS — I340 Nonrheumatic mitral (valve) insufficiency: Secondary | ICD-10-CM

## 2022-06-02 MED ORDER — DILTIAZEM HCL ER COATED BEADS 120 MG PO CP24: 120.0000 mg | ORAL_CAPSULE | Freq: Every day | ORAL | 3 refills | Status: DC

## 2022-06-02 MED ORDER — METOPROLOL SUCCINATE ER 50 MG PO TB24: 50.0000 mg | ORAL_TABLET | Freq: Two times a day (BID) | ORAL | 3 refills | Status: DC

## 2022-06-02 NOTE — Progress Notes (Signed)
Cardiology Office Note:    Date:  06/02/2022   ID:  Chelsea [name], DOB [birth year], MRN [id number]  PCP:  Stevphen Rochester, MD   [facility] Providers Cardiologist:  Donato Schultz, MD     Referring MD: No ref. provider found    History of Present Illness:    Chelsea [name] is a 81 y.o. female here for the follow-up of permanent atrial fibrillation, taking Coumadin for chronic anticoagulation.  Former patient of Dr. Yevonne Pax.  Prior shrimp allergy  Previously had rash on her back.  Previously stopped Eliquis because of numbness in fingertips.  At 1 point she thought she was having an allergy to Eliquis or Xarelto.  She was switched to Coumadin because of this.  Unfortunately, she has stopped her Coumadin as well.  Did not wish to curve her diet, lots of vegetables.  Was also unhappy with weekly INR checks.  She states she went to [place], a doctor there said take aspirin 81 mg. Denies any chest pain fevers chills nausea vomiting    Past Medical History:  Diagnosis Date   Arthritis    Atrial fibrillation (HCC)    Atrial fibrillation with RVR (HCC) 03/04/2015   CHF (congestive heart failure) (HCC)    Hypertension     Past Surgical History:  Procedure Laterality Date   NO PAST SURGERIES      Current Medications: Current Meds  Medication Sig   aspirin EC 81 MG tablet Take 81 mg by mouth daily.   diclofenac Sodium (VOLTAREN) 1 % GEL Apply 2 g topically 4 (four) times daily.   diltiazem (CARDIZEM CD) 120 MG 24 hr capsule Take 1 capsule (120 mg total) by mouth daily. Please make overdue appt with Dr. Anne Fu before anymore refills. Thank you 3rd and Final Attempt   doxycycline (VIBRAMYCIN) 100 MG capsule Take 1 capsule (100 mg total) by mouth 2 (two) times daily.   furosemide (LASIX) 40 MG tablet Take 1 tablet (40 mg total) by mouth daily. Please make overdue appt with Dr. Anne Fu before anymore refills. Thank you 3rd and Final Attempt   HYDROcodone-acetaminophen  (NORCO/VICODIN) 5-325 MG tablet Take 1 tablet by mouth every 4 (four) hours as needed.   metoprolol succinate (TOPROL-XL) 50 MG 24 hr tablet TAKE 1 TABLET BY MOUTH TWICE A DAY   VITAMIN D PO Take by mouth as directed.   [DISCONTINUED] warfarin (COUMADIN) 5 MG tablet TAKE AS DIRECTED BY THE COUMADIN CLINIC     Allergies:   Eliquis [apixaban], Shrimp [shellfish allergy], Penicillins, and Xarelto [rivaroxaban]   Social History   Socioeconomic History   Marital status: Widowed    Spouse name: Not on file   Number of children: Not on file   Years of education: Not on file   Highest education level: Not on file  Occupational History   Not on file  Tobacco Use   Smoking status: Never   Smokeless tobacco: Never  Vaping Use   Vaping Use: Never used  Substance and Sexual Activity   Alcohol use: No   Drug use: No   Sexual activity: Never  Other Topics Concern   Not on file  Social History Narrative   Not on file   Social Determinants of Health   Financial Resource Strain: Not on file  Food Insecurity: Not on file  Transportation Needs: Not on file  Physical Activity: Not on file  Stress: Not on file  Social Connections: Not on file     Family History: The patient's  family history includes Heart failure in her father.  ROS:   Please see the history of present illness.     All other systems reviewed and are negative.  EKGs/Labs/Other Studies Reviewed:    The following studies were reviewed today: Echocardiogram 2016-EF 60% trivial aortic regurgitation, moderate mitral regurgitation moderately dilated left atrium  EKG:  EKG is  ordered today.  The ekg ordered today demonstrates atrial fibrillation heart rate 83 bpm with nonspecific ST-T wave changes  Recent Labs: No results found for requested labs within last 365 days.  Recent Lipid Panel    Component Value Date/Time   CHOL 199 04/23/2013 0445   TRIG 85 04/23/2013 0445   HDL 50 04/23/2013 0445   CHOLHDL 4.0  04/23/2013 0445   VLDL 17 04/23/2013 0445   LDLCALC 132 (H) 04/23/2013 0445     Risk Assessment/Calculations:              Physical Exam:    VS:  BP 124/76   Pulse 83   Ht 5\' 4"  (1.626 m)   Wt 189 lb (85.7 kg)   LMP  (LMP Unknown)   SpO2 96%   BMI 32.44 kg/m     Wt Readings from Last 3 Encounters:  06/02/22 189 lb (85.7 kg)  02/23/21 187 lb (84.8 kg)  02/04/21 187 lb (84.8 kg)     GEN:  Well nourished, well developed in no acute distress HEENT: Normal NECK: No JVD; No carotid bruits LYMPHATICS: No lymphadenopathy CARDIAC: Irregularly irregular normal rate, no murmurs, no rubs, gallops RESPIRATORY:  Clear to auscultation without rales, wheezing or rhonchi  ABDOMEN: Soft, non-tender, non-distended MUSCULOSKELETAL:  No edema; No deformity  SKIN: Warm and dry NEUROLOGIC:  Alert and oriented x 3 PSYCHIATRIC:  Normal affect   ASSESSMENT:    1. Permanent atrial fibrillation (HCC)   2. Nonrheumatic mitral valve regurgitation    PLAN:    In order of problems listed above:  Permanent atrial fibrillation - Continue with Coumadin for chronic anticoagulation.  Had intolerances to Xarelto and possible allergy to Eliquis.  Rash.  Continue with diltiazem and metoprolol for rate control doing well.  Debility - Handicap pass administered.  Knee pain.  Mild to moderate mitral regurgitation - Seen on echocardiogram.  Stable no significant shortness of breath.  Continue to monitor clinically.  Chronic anticoagulation - Unfortunately has stopped her warfarin.  She is no longer anticoagulated.  I expressed the importance of anticoagulation in the setting of atrial fibrillation.  She has had intolerances/noncompliance with all the anticoagulants. - I will refer her to Dr. 02/06/21 for watchman.  She is at risk for stroke.  Explained her that aspirin 81 mg alone does not protect fully. I have seen Chelsea [name] is a 81 y.o. female in the office today who is being considered for a  Watchman left atrial appendage closure device.  She has a history of perm atrial fibrillation.  This patients CHA2DS2-VASc Score and unadjusted Ischemic Stroke Rate (% per year) is equal to 3.2 % stroke rate/year from a score of 3 which necessitates long term oral anticoagulation to prevent stroke.  Unfortunately, She is not felt to be a long term Warfarin candidate secondary to noncompliance.  The patients chart has been reviewed and I feel that they would be a candidate for short term oral anticoagulation.  Procedural risks for the Watchman implant have been reviewed with the patient including a 1% risk of stroke, 2% risk of perforation, 0.1% risk of device embolization.  Given the patient's poor candidacy for long-term oral anticoagulation and ability to tolerate short term oral anticoagulation I have recommended the watchman left atrial appendage closure system.        Medication Adjustments/Labs and Tests Ordered: Current medicines are reviewed at length with the patient today.  Concerns regarding medicines are outlined above.  Orders Placed This Encounter  Procedures   Ambulatory referral to Cardiac Electrophysiology   EKG 12-Lead   No orders of the defined types were placed in this encounter.   Patient Instructions  Medication Instructions:  Please decrease Aspirin to 81 mg a day. Continue all other medications as listed.  *If you need a refill on your cardiac medications before your next appointment, please call your pharmacy*   Follow-Up: At [facility], you and your health needs are our priority.  As part of our continuing mission to provide you with exceptional heart care, we have created designated Provider Care Teams.  These Care Teams include your primary Cardiologist (physician) and Advanced Practice Providers (APPs -  Physician Assistants and Nurse Practitioners) who all work together to provide you with the care you need, when you need it.  We recommend signing up for  the patient portal called "MyChart".  Sign up information is provided on this After Visit Summary.  MyChart is used to connect with patients for Virtual Visits (Telemedicine).  Patients are able to view lab/test results, encounter notes, upcoming appointments, etc.  Non-urgent messages can be sent to your provider as well.   To learn more about what you can do with MyChart, go to [email or web address].    Your next appointment:   1 year(s)  The format for your next appointment:   In Person  Provider:   None {  Important Information About Sugar         Signed, Donato Schultz, MD  06/02/2022 9:06 AM    [facility]

## 2022-06-02 NOTE — Patient Instructions (Addendum)
Medication Instructions:  Please decrease Aspirin to 81 mg a day. Continue all other medications as listed.  *If you need a refill on your cardiac medications before your next appointment, please call your pharmacy*  You have been referred to see Dr Steffanie Dunn to discuss Watchman device.   Follow-Up: At [facility], you and your health needs are our priority.  As part of our continuing mission to provide you with exceptional heart care, we have created designated Provider Care Teams.  These Care Teams include your primary Cardiologist (physician) and Advanced Practice Providers (APPs -  Physician Assistants and Nurse Practitioners) who all work together to provide you with the care you need, when you need it.  We recommend signing up for the patient portal called "MyChart".  Sign up information is provided on this After Visit Summary.  MyChart is used to connect with patients for Virtual Visits (Telemedicine).  Patients are able to view lab/test results, encounter notes, upcoming appointments, etc.  Non-urgent messages can be sent to your provider as well.   To learn more about what you can do with MyChart, go to [email or web address].    Your next appointment:   1 year(s)  The format for your next appointment:   In Person  Provider:   Dr Donato Schultz  Important Information About Sugar

## 2022-06-22 ENCOUNTER — Encounter (HOSPITAL_BASED_OUTPATIENT_CLINIC_OR_DEPARTMENT_OTHER): Payer: Self-pay | Admitting: Emergency Medicine

## 2022-06-22 ENCOUNTER — Emergency Department (HOSPITAL_BASED_OUTPATIENT_CLINIC_OR_DEPARTMENT_OTHER): Payer: Medicare Other

## 2022-06-22 ENCOUNTER — Other Ambulatory Visit: Payer: Self-pay

## 2022-06-22 ENCOUNTER — Emergency Department (HOSPITAL_BASED_OUTPATIENT_CLINIC_OR_DEPARTMENT_OTHER)
Admission: EM | Admit: 2022-06-22 | Discharge: 2022-06-22 | Disposition: A | Discharge status: Home / Self Care | Payer: Medicare Other | Attending: Emergency Medicine | Admitting: Emergency Medicine

## 2022-06-22 DIAGNOSIS — I509 Heart failure, unspecified: Secondary | ICD-10-CM | POA: Insufficient documentation

## 2022-06-22 DIAGNOSIS — X500XXA Overexertion from strenuous movement or load, initial encounter: Secondary | ICD-10-CM | POA: Diagnosis not present

## 2022-06-22 DIAGNOSIS — Z79899 Other long term (current) drug therapy: Secondary | ICD-10-CM | POA: Insufficient documentation

## 2022-06-22 DIAGNOSIS — I110 Hypertensive heart disease with heart failure: Secondary | ICD-10-CM | POA: Diagnosis not present

## 2022-06-22 DIAGNOSIS — Z7982 Long term (current) use of aspirin: Secondary | ICD-10-CM | POA: Diagnosis not present

## 2022-06-22 DIAGNOSIS — R0789 Other chest pain: Secondary | ICD-10-CM | POA: Diagnosis present

## 2022-06-22 DIAGNOSIS — R0781 Pleurodynia: Secondary | ICD-10-CM | POA: Insufficient documentation

## 2022-06-22 MED ORDER — LIDOCAINE 5 % EX PTCH
2.0000 | MEDICATED_PATCH | CUTANEOUS | Status: DC
  Administered 2022-06-22: 2 via TRANSDERMAL
  Filled 2022-06-22: qty 2

## 2022-06-22 MED ORDER — HYDROCODONE-ACETAMINOPHEN 5-325 MG PO TABS
1.0000 | ORAL_TABLET | Freq: Four times a day (QID) | ORAL | 0 refills | Status: DC | PRN
Start: 2022-06-22 — End: 2022-12-22

## 2022-06-22 NOTE — ED Provider Notes (Signed)
[facility] EMERGENCY DEPARTMENT Provider Note   CSN: [id number] Arrival date & time: 06/22/22  1059     History  Chief Complaint  Patient presents with   Rib Injury    Chelsea [name] is a 81 y.o. female.  81 year old female with past medical history of A-fib, not anticoagulated, CHF, hypertension, arthritis presents with concern for pain in her right and left lower ribs.  Patient states a family member gave her a very tight hug lifting her off of the ground on Friday (5 days ago).  Patient felt a pop in her ribs and has pain in that area ever since that is worse with movement and palpation as well as deep breaths.         Home Medications Prior to Admission medications   Medication Sig Start Date End Date Taking? Authorizing Provider  HYDROcodone-acetaminophen (NORCO/VICODIN) 5-325 MG tablet Take 1 tablet by mouth every 6 (six) hours as needed. 06/22/22  Yes Jeannie Fend, PA-C  aspirin EC 81 MG tablet Take 81 mg by mouth daily.    [provider]  diclofenac Sodium (VOLTAREN) 1 % GEL Apply 2 g topically 4 (four) times daily. 12/01/20   Dartha Lodge, PA-C  diltiazem (CARDIZEM CD) 120 MG 24 hr capsule Take 1 capsule (120 mg total) by mouth daily. 06/02/22   Jake Bathe, MD  doxycycline (VIBRAMYCIN) 100 MG capsule Take 1 capsule (100 mg total) by mouth 2 (two) times daily. 02/04/21   Jacalyn Lefevre, MD  metoprolol succinate (TOPROL-XL) 50 MG 24 hr tablet Take 1 tablet (50 mg total) by mouth 2 (two) times daily. Take with or immediately following a meal. 06/02/22   Jake Bathe, MD  VITAMIN D PO Take by mouth as directed.    [provider]      Allergies    Eliquis [apixaban], Shrimp [shellfish allergy], Penicillins, and Xarelto [rivaroxaban]    Review of Systems   Review of Systems Negative except as per HPI Physical Exam Updated Vital Signs BP 134/85 (BP Location: Left Arm)   Pulse 90   Temp 97.9 F (36.6 C) (Oral)   Resp 17   Ht 5\' 3"   (1.6 m)   Wt 84.8 kg   LMP  (LMP Unknown)   SpO2 95%   BMI 33.13 kg/m  Physical Exam Vitals and nursing note reviewed.  Constitutional:      General: She is not in acute distress.    Appearance: She is well-developed. She is not diaphoretic.  HENT:     Head: Normocephalic and atraumatic.  Eyes:     Conjunctiva/sclera: Conjunctivae normal.  Cardiovascular:     Rate and Rhythm: Normal rate and regular rhythm.     Pulses: Normal pulses.     Heart sounds: Normal heart sounds.  Pulmonary:     Effort: Pulmonary effort is normal.     Breath sounds: Normal breath sounds.  Chest:     Chest wall: Tenderness present.       Comments: No crepitus, lungs clear he does have tenderness to anterior left and right lower midclavicular ribs. Abdominal:     Palpations: Abdomen is soft.     Tenderness: There is no abdominal tenderness.  Skin:    General: Skin is warm and dry.     Findings: No bruising, erythema or rash.  Neurological:     Mental Status: She is alert and oriented to person, place, and time.  Psychiatric:  Behavior: Behavior normal.     ED Results / Procedures / Treatments   Labs (all labs ordered are listed, but only abnormal results are displayed) Labs Reviewed - No data to display  EKG None  Radiology DG Ribs Bilateral W/Chest  Result Date: 06/22/2022 CLINICAL DATA:  Bilateral rib pain after injury several days ago. EXAM: BILATERAL RIBS AND CHEST - 4+ VIEW COMPARISON:  Apr 05, 2017. FINDINGS: No fracture or other bone lesions are seen involving the ribs. There is no evidence of pneumothorax or pleural effusion. Both lungs are clear. Mild cardiomegaly is noted. IMPRESSION: No definite evidence of rib fracture. No acute cardiopulmonary abnormality seen. Electronically Signed   By: Lupita Raider M.D.   On: 06/22/2022 12:28    Procedures Procedures    Medications Ordered in ED Medications  lidocaine (LIDODERM) 5 % 2 patch (2 patches Transdermal Patch Applied  06/22/22 1346)    ED Course/ Medical Decision Making/ A&P                           Medical Decision Making Amount and/or Complexity of Data Reviewed Radiology: ordered.   81 year old female with concern for rib injury after a family member gave her a tight hug as above.  She has pain to the left and right mid clavicular lower ribs without crepitus.  X-ray rib series is obtained and is negative for obvious rib injury.  Discussed with patient possibility of fracture not seen on x-ray versus contusion.  Will apply Lidoderm patch here and provide with incentive spirometer.  Patient is taking Tylenol which seems to help for the most part with her pain but is agreeable to brief course of narcotic pain medication.  She does live alone, family is here with her.  Discussed concerns for fall risks on narcotics.  Patient will take this medication only in the presence of her family who will help monitor her.,  And follow-up with primary care provider.  Return to ER as needed.        Final Clinical Impression(s) / ED Diagnoses Final diagnoses:  Chest wall pain    Rx / DC Orders ED Discharge Orders          Ordered    HYDROcodone-acetaminophen (NORCO/VICODIN) 5-325 MG tablet  Every 6 hours PRN        06/22/22 1337              Jeannie Fend, PA-C 06/22/22 1348    Mardene Sayer, MD 06/22/22 1549

## 2022-06-22 NOTE — Discharge Instructions (Signed)
Use incentive spirometer as directed. Apply lidocaine patch to skin where you have pain. You can continue with your Tylenol.  If this is not sufficient for pain control, can take half to 1 tablet of Norco.  Take this medication when you are with family in case it makes you unsteady on your feet.  This medication can cause falls.  This medication can also cause constipation, take with a stool softener or laxative as needed. Recheck with your primary care provider.

## 2022-06-22 NOTE — ED Triage Notes (Signed)
Pt reports bilateral ribcage pain, worse on LT, since Fri; sts someone gave her a hug and lifted her off the ground; she has had pain ever since

## 2022-06-27 ENCOUNTER — Encounter (HOSPITAL_BASED_OUTPATIENT_CLINIC_OR_DEPARTMENT_OTHER): Payer: Self-pay

## 2022-06-27 DIAGNOSIS — W57XXXA Bitten or stung by nonvenomous insect and other nonvenomous arthropods, initial encounter: Secondary | ICD-10-CM | POA: Diagnosis not present

## 2022-06-27 DIAGNOSIS — Z5321 Procedure and treatment not carried out due to patient leaving prior to being seen by health care provider: Secondary | ICD-10-CM | POA: Insufficient documentation

## 2022-06-27 DIAGNOSIS — S60561A Insect bite (nonvenomous) of right hand, initial encounter: Secondary | ICD-10-CM | POA: Insufficient documentation

## 2022-06-27 NOTE — ED Triage Notes (Signed)
Pt presents to the ED with grandson for an insect bite on her right hand. States that the bite happened this morning, but she didn't see the insect. Localized redness and swelling noted to right hand. Pt reports pain up right arm.   Pt also reports some swelling to her left eye. Denies pain to the eye. Denies difficulty breathing or swallowing. Denies CP.   Pt A&Ox4 at time of triage. VSS.

## 2022-06-28 ENCOUNTER — Emergency Department (HOSPITAL_BASED_OUTPATIENT_CLINIC_OR_DEPARTMENT_OTHER)
Admission: EM | Admit: 2022-06-28 | Discharge: 2022-06-28 | Discharge status: Against Medical Advice | Payer: Medicare Other | Attending: Emergency Medicine | Admitting: Emergency Medicine

## 2022-07-07 ENCOUNTER — Encounter (HOSPITAL_BASED_OUTPATIENT_CLINIC_OR_DEPARTMENT_OTHER): Payer: Self-pay | Admitting: Emergency Medicine

## 2022-07-07 ENCOUNTER — Emergency Department (HOSPITAL_BASED_OUTPATIENT_CLINIC_OR_DEPARTMENT_OTHER): Payer: Medicare Other

## 2022-07-07 ENCOUNTER — Emergency Department (HOSPITAL_BASED_OUTPATIENT_CLINIC_OR_DEPARTMENT_OTHER)
Admission: EM | Admit: 2022-07-07 | Discharge: 2022-07-07 | Disposition: A | Discharge status: Home / Self Care | Payer: Medicare Other | Attending: Emergency Medicine | Admitting: Emergency Medicine

## 2022-07-07 ENCOUNTER — Other Ambulatory Visit: Payer: Self-pay

## 2022-07-07 DIAGNOSIS — I509 Heart failure, unspecified: Secondary | ICD-10-CM | POA: Insufficient documentation

## 2022-07-07 DIAGNOSIS — I4891 Unspecified atrial fibrillation: Secondary | ICD-10-CM | POA: Diagnosis not present

## 2022-07-07 DIAGNOSIS — Z79899 Other long term (current) drug therapy: Secondary | ICD-10-CM | POA: Diagnosis not present

## 2022-07-07 DIAGNOSIS — M25511 Pain in right shoulder: Secondary | ICD-10-CM | POA: Insufficient documentation

## 2022-07-07 DIAGNOSIS — Z7901 Long term (current) use of anticoagulants: Secondary | ICD-10-CM | POA: Diagnosis not present

## 2022-07-07 LAB — CBC
HCT: 42.8 % (ref 36.0–46.0)
Hemoglobin: 13.6 g/dL (ref 12.0–15.0)
MCH: 28.5 pg (ref 26.0–34.0)
MCHC: 31.8 g/dL (ref 30.0–36.0)
MCV: 89.5 fL (ref 80.0–100.0)
Platelets: 285 10*3/uL (ref 150–400)
RBC: 4.78 MIL/uL (ref 3.87–5.11)
RDW: 14.6 % (ref 11.5–15.5)
WBC: 12.8 10*3/uL — ABNORMAL HIGH (ref 4.0–10.5)
nRBC: 0 % (ref 0.0–0.2)

## 2022-07-07 LAB — BASIC METABOLIC PANEL
Anion gap: 8 (ref 5–15)
BUN: 17 mg/dL (ref 8–23)
CO2: 28 mmol/L (ref 22–32)
Calcium: 8.8 mg/dL — ABNORMAL LOW (ref 8.9–10.3)
Chloride: 102 mmol/L (ref 98–111)
Creatinine, Ser: 0.67 mg/dL (ref 0.44–1.00)
GFR, Estimated: >60 mL/min (ref >60)
Glucose, Bld: 104 mg/dL — ABNORMAL HIGH (ref 70–99)
Potassium: 3.4 mmol/L — ABNORMAL LOW (ref 3.5–5.1)
Sodium: 138 mmol/L (ref 135–145)

## 2022-07-07 LAB — TROPONIN I (HIGH SENSITIVITY): Troponin I (High Sensitivity): 11 ng/L (ref <18)

## 2022-07-07 MED ORDER — ACETAMINOPHEN 500 MG PO TABS
1000.0000 mg | ORAL_TABLET | Freq: Once | ORAL | Status: AC
  Administered 2022-07-07: 1000 mg via ORAL
  Filled 2022-07-07: qty 2

## 2022-07-07 MED ORDER — LIDOCAINE 5 % EX PTCH
1.0000 | MEDICATED_PATCH | CUTANEOUS | Status: DC
  Administered 2022-07-07: 1 via TRANSDERMAL
  Filled 2022-07-07: qty 1

## 2022-07-07 NOTE — ED Triage Notes (Signed)
Pt states she went for MD appt yesterday and had blood drawn to right arm.  She went home and woke up this am at 2 am with right upper arm and shoulder pain.  No chest pain or sob currently.  Pt states she has some discoloration around her scapula bilaterally that is new.  It appears a light brown color.

## 2022-07-07 NOTE — ED Notes (Signed)
To x-ray

## 2022-07-07 NOTE — Discharge Instructions (Addendum)
Diagnosed with right shoulder pain.  Recommend follow-up with PCP regarding all ongoing shoulder pain.  EKG revealed you are not atrial fibrillation.  Meant that you take your metoprolol today and continue aspirin.  Otherwise follow-up with PCP regarding arm pain.  Recommend ibuprofen Tylenol as needed for pain.  If you have new chest pain, shortness of breath, heart flutter or palpitations, syncope (passing out ) please return to the emergency department for further evaluation.

## 2022-07-07 NOTE — ED Provider Notes (Signed)
[facility] EMERGENCY DEPARTMENT Provider Note   CSN: [id number] Arrival date & time: 07/07/22  1027     History  Chief Complaint  Patient presents with   Arm Pain   Shoulder Pain   HPI Chelsea [name] is a 81 y.o. female with atrial fibrillation and CHF presenting for right shoulder pain.  States that yesterday she was getting a blood draw in her right arm.  Later that evening she endorsed pain in her right shoulder.  She attributed this pain to the IV draw.  The pain is reproducible and nonradiating.  States that it hurts all day long and is sharp in nature.  Denies shortness of breath and chest pain.   Arm Pain  Shoulder Pain      Home Medications Prior to Admission medications   Medication Sig Start Date End Date Taking? Authorizing Provider  aspirin EC 81 MG tablet Take 81 mg by mouth daily.    [provider]  diclofenac Sodium (VOLTAREN) 1 % GEL Apply 2 g topically 4 (four) times daily. 12/01/20   Dartha Lodge, PA-C  diltiazem (CARDIZEM CD) 120 MG 24 hr capsule Take 1 capsule (120 mg total) by mouth daily. 06/02/22   Jake Bathe, MD  doxycycline (VIBRAMYCIN) 100 MG capsule Take 1 capsule (100 mg total) by mouth 2 (two) times daily. 02/04/21   Jacalyn Lefevre, MD  HYDROcodone-acetaminophen (NORCO/VICODIN) 5-325 MG tablet Take 1 tablet by mouth every 6 (six) hours as needed. 06/22/22   Jeannie Fend, PA-C  metoprolol succinate (TOPROL-XL) 50 MG 24 hr tablet Take 1 tablet (50 mg total) by mouth 2 (two) times daily. Take with or immediately following a meal. 06/02/22   Jake Bathe, MD  VITAMIN D PO Take by mouth as directed.    [provider]      Allergies    Eliquis [apixaban], Penicillins, Shrimp [shellfish allergy], and Xarelto [rivaroxaban]    Review of Systems   Review of Systems  Musculoskeletal:        Right shoulder pain    Physical Exam Updated Vital Signs BP (!) 142/75   Pulse 99   Temp 98.1 F (36.7 C) (Oral)   Resp  (!) 23   Ht 5\' 4"  (1.626 m)   Wt 62.1 kg   LMP  (LMP Unknown)   SpO2 100%   BMI 23.52 kg/m  Physical Exam Vitals and nursing note reviewed.  HENT:     Head: Normocephalic and atraumatic.     Mouth/Throat:     Mouth: Mucous membranes are moist.  Eyes:     General:        Right eye: No discharge.        Left eye: No discharge.     Conjunctiva/sclera: Conjunctivae normal.  Cardiovascular:     Rate and Rhythm: Normal rate. Rhythm irregular.     Pulses: Normal pulses.          Radial pulses are 2+ on the right side and 2+ on the left side.     Heart sounds: Normal heart sounds.  Pulmonary:     Effort: Pulmonary effort is normal.     Breath sounds: Normal breath sounds.  Abdominal:     General: Abdomen is flat.     Palpations: Abdomen is soft.  Musculoskeletal:     Comments: Unable to assess range of motion of the right shoulder.  Patient deferred due to pain.  Normal sensation in right arm.  2+ pulses bilaterally  and good cap refill bilaterally.  Normal range of motion and sensation in the left arm.   Skin:    General: Skin is warm and dry.  Neurological:     General: No focal deficit present.  Psychiatric:        Mood and Affect: Mood normal.     ED Results / Procedures / Treatments   Labs (all labs ordered are listed, but only abnormal results are displayed) Labs Reviewed  BASIC METABOLIC PANEL - Abnormal; Notable for the following components:      Result Value   Potassium 3.4 (*)    Glucose, Bld 104 (*)    Calcium 8.8 (*)    All other components within normal limits  CBC - Abnormal; Notable for the following components:   WBC 12.8 (*)    All other components within normal limits  TROPONIN I (HIGH SENSITIVITY)  TROPONIN I (HIGH SENSITIVITY)    EKG Concern for atrial fibrillation  Radiology DG Chest 2 View  Result Date: 07/07/2022 CLINICAL DATA:  Right shoulder pain for 1 day EXAM: CHEST - 2 VIEW COMPARISON:  06/22/2022 FINDINGS: Frontal and lateral views of  the chest demonstrates stable enlargement the cardiac silhouette. Continued thoracic aortic atherosclerosis and ectasia. No acute airspace disease, effusion, or pneumothorax. No acute bony abnormalities. Stable degenerative changes of the bilateral shoulders. IMPRESSION: 1. Stable chest, no acute process. Electronically Signed   By: Sharlet Salina M.D.   On: 07/07/2022 10:58    Procedures Procedures    Medications Ordered in ED Medications  lidocaine (LIDODERM) 5 % 1 patch (1 patch Transdermal Patch Applied 07/07/22 1155)  acetaminophen (TYLENOL) tablet 1,000 mg (1,000 mg Oral Given 07/07/22 1154)    ED Course/ Medical Decision Making/ A&P                           Medical Decision Making Amount and/or Complexity of Data Reviewed Labs: ordered. Radiology: ordered.  Risk OTC drugs. Prescription drug management.   Patient presented for right shoulder pain.  She attributed this pain to IV blood draw yesterday from the right arm.  However she localizes pain to her right shoulder.  Physical exam revealed that she is neurovascular intact.  Able to assess range of motion of the right arm.  Patient deferred due to pain.  Chest x-ray revealed stable shoulders bilaterally with only the chronic degenerative changes.  Consider ACS but unlikely given no ischemic changes on the EKG and patient denies shortness of breath or chest pain.  EKG revealed concern for atrial fibrillation.  Troponins within normal limit.  Patient reported that she did not take her metoprolol this morning.  Also confirmed that she takes aspirin no longer takes Xarelto or Eliquis due to tolerance.  Advised patient to take her metoprolol today.  Discussed return precautions.         Final Clinical Impression(s) / ED Diagnoses Final diagnoses:  Acute pain of right shoulder    Rx / DC Orders ED Discharge Orders     None         Gareth Eagle, PA-C 07/07/22 1304    Arby Barrette, MD 07/08/22 [phone]

## 2022-07-11 NOTE — Progress Notes (Unsigned)
Electrophysiology Office Note:    Date:  07/11/2022   ID:  Chelsea [name], DOB 29-Sep-1941, MRN [id number]  PCP:  Patient, No Pcp Per  [facility] Cardiologist:  Donato Schultz, MD  [facility] Electrophysiologist:  Lanier Prude, MD   Referring MD: Stevphen Rochester, MD   Chief Complaint: AF  History of Present Illness:    Chelsea [name] is a 81 y.o. female who presents for an evaluation of AF at the request of Dr Anne Fu. Their medical history includes HTN, HF, AF.   The patient was seen by Dr Anne Fu 06/02/2022. Previously was seen by Dr Patty Sermons. She was taking Coumadin. In the past she thought she was having a reaction to Eliquis with numbness in her fingertips.        Past Medical History:  Diagnosis Date   Arthritis    Atrial fibrillation [facility])    Atrial fibrillation with RVR (HCC) 03/04/2015   CHF (congestive heart failure) (HCC)    Hypertension     Past Surgical History:  Procedure Laterality Date   NO PAST SURGERIES      Current Medications: No outpatient medications have been marked as taking for the 07/12/22 encounter (Office Visit) with Lanier Prude, MD.     Allergies:   Eliquis [apixaban], Penicillins, Shrimp [shellfish allergy], and Xarelto [rivaroxaban]   Social History   Socioeconomic History   Marital status: Widowed    Spouse name: Not on file   Number of children: Not on file   Years of education: Not on file   Highest education level: Not on file  Occupational History   Not on file  Tobacco Use   Smoking status: Never   Smokeless tobacco: Never  Vaping Use   Vaping Use: Never used  Substance and Sexual Activity   Alcohol use: No   Drug use: No   Sexual activity: Never  Other Topics Concern   Not on file  Social History Narrative   Not on file   Social Determinants of Health   Financial Resource Strain: Not on file  Food Insecurity: Not on file  Transportation Needs: Not on file  Physical Activity: Not on file  Stress:  Not on file  Social Connections: Not on file     Family History: The patient's family history includes Heart failure in her father.  ROS:   Please see the history of present illness.    All other systems reviewed and are negative.  EKGs/Labs/Other Studies Reviewed:    The following studies were reviewed today:  12/15/2014 TTE EF 55 Mod MR Moderately dilated LA     Recent Labs: 07/07/2022: BUN 17; Creatinine, Ser 0.67; Hemoglobin 13.6; Platelets 285; Potassium 3.4; Sodium 138  Recent Lipid Panel    Component Value Date/Time   CHOL 199 04/23/2013 0445   TRIG 85 04/23/2013 0445   HDL 50 04/23/2013 0445   CHOLHDL 4.0 04/23/2013 0445   VLDL 17 04/23/2013 0445   LDLCALC 132 (H) 04/23/2013 0445    Physical Exam:    VS:  LMP  (LMP Unknown)     Wt Readings from Last 3 Encounters:  07/07/22 137 lb (62.1 kg)  06/27/22 187 lb (84.8 kg)  06/22/22 187 lb (84.8 kg)     GEN: *** Well nourished, well developed in no acute distress HEENT: Normal NECK: No JVD; No carotid bruits LYMPHATICS: No lymphadenopathy CARDIAC: ***RRR, no murmurs, rubs, gallops RESPIRATORY:  Clear to auscultation without rales, wheezing or rhonchi  ABDOMEN: Soft, non-tender, non-distended  MUSCULOSKELETAL:  No edema; No deformity  SKIN: Warm and dry NEUROLOGIC:  Alert and oriented x 3 PSYCHIATRIC:  Normal affect       ASSESSMENT:    1. Permanent atrial fibrillation (HCC)   2. Chronic diastolic heart failure (HCC)   3. Essential hypertension    PLAN:    In order of problems listed above:   #Permanent Atrial Fibrillation Rate controlled on metoprolol and diltiazem. Currently not on [facility] due to reactions to NOACs and desire to not takes Coumadin although she previously was prescribed/taking this medication.  ----------  I have seen Chelsea [name] in the office today who is being considered for a Watchman left atrial appendage closure device. I believe they will benefit from this procedure given  their history of atrial fibrillation, CHA2DS2-VASc score of 5. and unadjusted ischemic stroke rate of 7.2% per year. Unfortunately, the patient is not felt to be a long term anticoagulation candidate secondary to previous AC intolerances and a desire to avoid long term exposure to anticoagulation. The patient's chart has been reviewed and I feel that they would be a candidate for short term oral anticoagulation after Watchman implant.   It is my belief that after undergoing a LAA closure procedure, Chelsea [name] will not need long term anticoagulation which eliminates anticoagulation side effects and major bleeding risk.   Procedural risks for the Watchman implant have been reviewed with the patient including a 0.5% risk of stroke, <1% risk of perforation and <1% risk of device embolization. Other risks include bleeding, vascular damage, tamponade, worsening renal function, and death. The patient understands these risk and wishes to proceed.     The published clinical data on the safety and effectiveness of WATCHMAN include but are not limited to the following: - Holmes DR, Everlene Farrier, Sick P et al. for the PROTECT AF Investigators. Percutaneous closure of the left atrial appendage versus warfarin therapy for prevention of stroke in patients with atrial fibrillation: a randomised non-inferiority trial. Lancet 2009; 374: 534-42. Everlene Farrier, Doshi SK, Isa Rankin D et al. on behalf of the PROTECT AF Investigators. Percutaneous Left Atrial Appendage Closure for Stroke Prophylaxis in Patients With Atrial Fibrillation 2.3-Year Follow-up of the PROTECT AF (Watchman Left Atrial Appendage System for Embolic Protection in Patients With Atrial Fibrillation) Trial. Circulation 2013; 127:720-729. - Alli O, Doshi S,  Kar S, Reddy VY, Sievert H et al. Quality of Life Assessment in the Randomized PROTECT AF (Percutaneous Closure of the Left Atrial Appendage Versus Warfarin Therapy for Prevention of Stroke in Patients  With Atrial Fibrillation) Trial of Patients at Risk for Stroke With Nonvalvular Atrial Fibrillation. J Am Coll Cardiol 2013; 61:1790-8. Aline August DR, Mia [place], Price M, Whisenant B, Sievert H, Doshi S, Huber K, Reddy V. Prospective randomized evaluation of the Watchman left atrial appendage Device in patients with atrial fibrillation versus long-term warfarin therapy; the PREVAIL trial. Journal of the [employer] of Cardiology, Vol. 4, No. 1, 2014, 1-11. - Kar S, Doshi SK, Sadhu A, Horton R, Osorio J et al. Primary outcome evaluation of a next-generation left atrial appendage closure device: results from the PINNACLE FLX trial. Circulation 2021;143(18)1754-1762.    After today's visit with the patient which was dedicated solely for shared decision making visit regarding LAA closure device, the patient decided to proceed with the LAA appendage closure procedure scheduled to be done in the near future at [facility]. Prior to the procedure, I would like to obtain a gated CT scan  of the chest with contrast timed for PV/LA visualization.   Additionally, the patient will need a TTE.  HAS-BLED score 2 Hypertension Yes  Abnormal renal and liver function (Dialysis, transplant, Cr >2.26 mg/dL /Cirrhosis or Bilirubin >2x Normal or AST/ALT/AP >3x Normal) No  Stroke No  Bleeding No  Labile INR (Unstable/high INR) No  Elderly (>65) Yes  Drugs or alcohol (? 8 drinks/week, anti-plt or NSAID) No   CHA2DS2-VASc Score = 5  The patient's score is based upon: CHF History: 1 HTN History: 1 Diabetes History: 0 Stroke History: 0 Vascular Disease History: 0 Age Score: 2 Gender Score: 1  Plan for DAPT after implant. Continue Aspirin 81mg  PO daily preoperatively. Immediately post implant, plan to load with Plavix 300mg  and then start 75mg  PO daily.   Medication Adjustments/Labs and Tests Ordered: Current medicines are reviewed at length with the patient today.  Concerns regarding medicines are  outlined above.  No orders of the defined types were placed in this encounter.  No orders of the defined types were placed in this encounter.    Signed, . , MD, [facility], [facility] 07/11/2022 4:07 PM    Electrophysiology [facility]

## 2022-07-12 ENCOUNTER — Ambulatory Visit: Payer: Medicare Other | Admitting: Cardiology

## 2022-07-12 DIAGNOSIS — I5032 Chronic diastolic (congestive) heart failure: Secondary | ICD-10-CM

## 2022-07-12 DIAGNOSIS — I4821 Permanent atrial fibrillation: Secondary | ICD-10-CM

## 2022-07-12 DIAGNOSIS — I10 Essential (primary) hypertension: Secondary | ICD-10-CM

## 2022-07-13 ENCOUNTER — Telehealth: Payer: Self-pay | Admitting: Physician Assistant

## 2022-07-13 NOTE — Telephone Encounter (Signed)
  HE[facility] AND VASCULAR CENTER   MULTIDISCIPLINARY HEART TEAM   Dr. Anne Fu referred pt to Dr Lalla Brothers for [facility] discussion given non compliance with Coumadin in the setting of afib. She had an apt for consultation on 9/5 for which she did not show. We called to r/s and cannot get her in before she leaves for [place] for 2 month trip with an unclear return date. She thinks she will be back late December. We will reach back out 11/2022.  Cline Crock PA-C  [facility]

## 2022-12-06 ENCOUNTER — Other Ambulatory Visit: Payer: Self-pay

## 2022-12-06 ENCOUNTER — Emergency Department (HOSPITAL_BASED_OUTPATIENT_CLINIC_OR_DEPARTMENT_OTHER): Payer: Medicare Other

## 2022-12-06 ENCOUNTER — Emergency Department (HOSPITAL_BASED_OUTPATIENT_CLINIC_OR_DEPARTMENT_OTHER)
Admission: EM | Admit: 2022-12-06 | Discharge: 2022-12-06 | Disposition: A | Discharge status: Home / Self Care | Payer: Medicare Other | Attending: Emergency Medicine | Admitting: Emergency Medicine

## 2022-12-06 ENCOUNTER — Encounter (HOSPITAL_BASED_OUTPATIENT_CLINIC_OR_DEPARTMENT_OTHER): Payer: Self-pay

## 2022-12-06 DIAGNOSIS — Z7982 Long term (current) use of aspirin: Secondary | ICD-10-CM | POA: Diagnosis not present

## 2022-12-06 DIAGNOSIS — R0600 Dyspnea, unspecified: Secondary | ICD-10-CM

## 2022-12-06 DIAGNOSIS — Z1152 Encounter for screening for COVID-19: Secondary | ICD-10-CM | POA: Insufficient documentation

## 2022-12-06 DIAGNOSIS — I110 Hypertensive heart disease with heart failure: Secondary | ICD-10-CM | POA: Insufficient documentation

## 2022-12-06 DIAGNOSIS — I509 Heart failure, unspecified: Secondary | ICD-10-CM | POA: Insufficient documentation

## 2022-12-06 DIAGNOSIS — M7121 Synovial cyst of popliteal space [Baker], right knee: Secondary | ICD-10-CM | POA: Diagnosis not present

## 2022-12-06 DIAGNOSIS — I7121 Aneurysm of the ascending aorta, without rupture: Secondary | ICD-10-CM | POA: Diagnosis not present

## 2022-12-06 DIAGNOSIS — I4891 Unspecified atrial fibrillation: Secondary | ICD-10-CM | POA: Insufficient documentation

## 2022-12-06 DIAGNOSIS — R0602 Shortness of breath: Secondary | ICD-10-CM | POA: Diagnosis present

## 2022-12-06 DIAGNOSIS — R0781 Pleurodynia: Secondary | ICD-10-CM

## 2022-12-06 LAB — BASIC METABOLIC PANEL
Anion gap: 10 (ref 5–15)
BUN: 21 mg/dL (ref 8–23)
CO2: 26 mmol/L (ref 22–32)
Calcium: 9 mg/dL (ref 8.9–10.3)
Chloride: 103 mmol/L (ref 98–111)
Creatinine, Ser: 0.89 mg/dL (ref 0.44–1.00)
GFR, Estimated: >60 mL/min (ref >60)
Glucose, Bld: 157 mg/dL — ABNORMAL HIGH (ref 70–99)
Potassium: 3.8 mmol/L (ref 3.5–5.1)
Sodium: 139 mmol/L (ref 135–145)

## 2022-12-06 LAB — CBC
HCT: 39.9 % (ref 36.0–46.0)
Hemoglobin: 12.9 g/dL (ref 12.0–15.0)
MCH: 28.5 pg (ref 26.0–34.0)
MCHC: 32.3 g/dL (ref 30.0–36.0)
MCV: 88.1 fL (ref 80.0–100.0)
Platelets: 274 10*3/uL (ref 150–400)
RBC: 4.53 MIL/uL (ref 3.87–5.11)
RDW: 14.2 % (ref 11.5–15.5)
WBC: 10.4 10*3/uL (ref 4.0–10.5)
nRBC: 0 % (ref 0.0–0.2)

## 2022-12-06 LAB — RESP PANEL BY RT-PCR (RSV, FLU A&B, COVID)  RVPGX2
Influenza A by PCR: NEGATIVE
Influenza B by PCR: NEGATIVE
Resp Syncytial Virus by PCR: NEGATIVE
SARS Coronavirus 2 by RT PCR: NEGATIVE

## 2022-12-06 LAB — TROPONIN I (HIGH SENSITIVITY): Troponin I (High Sensitivity): 12 ng/L (ref <18)

## 2022-12-06 MED ORDER — FUROSEMIDE 10 MG/ML IJ SOLN
40.0000 mg | Freq: Once | INTRAMUSCULAR | Status: AC
  Administered 2022-12-06: 40 mg via INTRAVENOUS
  Filled 2022-12-06: qty 4

## 2022-12-06 MED ORDER — ALBUTEROL SULFATE HFA 108 (90 BASE) MCG/ACT IN AERS: 2.0000 | INHALATION_SPRAY | RESPIRATORY_TRACT | Status: DC | PRN

## 2022-12-06 MED ORDER — IOHEXOL 350 MG/ML SOLN
75.0000 mL | Freq: Once | INTRAVENOUS | Status: AC | PRN
  Administered 2022-12-06: 75 mL via INTRAVENOUS

## 2022-12-06 NOTE — Discharge Instructions (Addendum)
Your CT scan and lab work is reassuring.  Please call your heart doctor tomorrow to make them aware of your ED visit and arrange close follow-up in the coming week.  Return with any new or suddenly worsening symptoms.

## 2022-12-06 NOTE — ED Notes (Signed)
Recollected troponin as initial one was not able to be run. Troponin in lab at this time.

## 2022-12-06 NOTE — ED Provider Notes (Signed)
Emergency Department Provider Note   I have reviewed the triage vital signs and the nursing notes.   HISTORY  Chief Complaint Abnormal Lab and Shortness of Breath   HPI Chelsea [name] is a 82 y.o. female past history of CHF, hypertension, A-fib on aspirin presents to the emergency department with acute onset right chest pain with shortness of breath.  Symptoms began 3 days prior without clear provocation.  Describes the pain as worse with deep breathing and radiating to the right shoulder.  She took Tylenol with some relief.  She is also having some pain in the right leg along with her chronic bilateral leg swelling.  No fevers or chills.  She had been anticoagulated with her A-fib in the past but she tells me that she had some bruising and this was discontinued.  She does not have a history of heavy GI bleeding or intracranial hemorrhage. No prior history of PE or DVT.    Past Medical History:  Diagnosis Date   Arthritis    Atrial fibrillation ([place])    Atrial fibrillation with RVR ([place]) 03/04/2015   CHF (congestive heart failure) (HCC)    Hypertension     Review of Systems  Constitutional: No fever/chills Eyes: No visual changes. ENT: No sore throat. Cardiovascular: Positive chest pain. Respiratory: Positive shortness of breath. Gastrointestinal: No abdominal pain.  No nausea, no vomiting.  No diarrhea.  No constipation. Genitourinary: Negative for dysuria. Musculoskeletal: Negative for back pain. Positive right leg pain.  Skin: Negative for rash. Neurological: Negative for headaches, focal weakness or numbness.   ____________________________________________   PHYSICAL EXAM:  VITAL SIGNS: ED Triage Vitals  Enc Vitals Group     BP 12/06/22 1631 (!) 148/119     Pulse Rate 12/06/22 1631 (!) 110     Resp 12/06/22 1631 (!) 24     Temp 12/06/22 1631 98 F (36.7 C)     Temp Source 12/06/22 1631 Oral     SpO2 12/06/22 1631 96 %     Weight 12/06/22 1627 187 lb (84.8 kg)      Height 12/06/22 1627 5\' 4"  (1.626 m)   Constitutional: Alert and oriented. Well appearing and in no acute distress. Eyes: Conjunctivae are normal.  Head: Atraumatic. Nose: No congestion/rhinnorhea. Mouth/Throat: Mucous membranes are moist.   Neck: No stridor.  Cardiovascular: Tachycardia. Good peripheral circulation. Grossly normal heart sounds.   Respiratory: Increased respiratory effort.  No retractions. Lungs CTAB. No wheezing.  Gastrointestinal: Soft and nontender. No distention.  Musculoskeletal: No lower extremity tenderness. 1+ pitting edema bilaterally. No gross deformities of extremities. Neurologic:  Normal speech and language. No gross focal neurologic deficits are appreciated.  Skin:  Skin is warm, dry and intact. No rash noted.  ____________________________________________   LABS (all labs ordered are listed, but only abnormal results are displayed)  Labs Reviewed  BASIC METABOLIC PANEL - Abnormal; Notable for the following components:      Result Value   Glucose, Bld 157 (*)    All other components within normal limits  RESP PANEL BY RT-PCR (RSV, FLU A&B, COVID)  RVPGX2  CBC  TROPONIN I (HIGH SENSITIVITY)  TROPONIN I (HIGH SENSITIVITY)   ____________________________________________  EKG   EKG Interpretation  Date/Time:  Tuesday December 06 2022 16:37:13 EST Ventricular Rate:  108 PR Interval:    QRS Duration: 83 QT Interval:  352 QTC Calculation: 472 R Axis:   57 Text Interpretation: Atrial fibrillation Borderline repolarization abnormality Baseline wander in lead(s) V1 V2 V3  V4 V5 V6 Confirmed by Nanda Quinton [phone]) on 12/06/2022 4:43:05 PM        ____________________________________________  RADIOLOGY  CT Angio Chest PE W and/or Wo Contrast  Result Date: 12/06/2022 CLINICAL DATA:  Positive D-dimer. Shortness of breath and right chest pain. EXAM: CT ANGIOGRAPHY CHEST WITH CONTRAST TECHNIQUE: Multidetector CT imaging of the chest was performed  using the standard protocol during bolus administration of intravenous contrast. Multiplanar CT image reconstructions and MIPs were obtained to evaluate the vascular anatomy. RADIATION DOSE REDUCTION: This exam was performed according to the departmental dose-optimization program which includes automated exposure control, adjustment of the mA and/or kV according to patient size and/or use of iterative reconstruction technique. CONTRAST:  9mL OMNIPAQUE IOHEXOL 350 MG/ML SOLN COMPARISON:  Chest x-ray same day FINDINGS: Cardiovascular: There is adequate opacification of the pulmonary arteries to the segmental level. There is no evidence for pulmonary embolism. Heart is mildly enlarged. There is mild aneurysmal dilatation of the ascending aorta measuring 4.3 cm. There is no pericardial effusion. There is atherosclerotic disease throughout the aorta. Mediastinum/Nodes: No enlarged mediastinal, hilar, or axillary lymph nodes. Thyroid gland, trachea, and esophagus demonstrate no significant findings. Lungs/Pleura: There is linear atelectasis in the left lower lobe and lingula. Lungs are otherwise clear. No pleural effusion or pneumothorax. Upper Abdomen: No acute abnormality. Musculoskeletal: No chest wall abnormality. No acute or significant osseous findings. Review of the MIP images confirms the above findings. IMPRESSION: 1. No evidence for pulmonary embolism or other acute cardiopulmonary process. 2. Mild cardiomegaly. 3. 4.3 cm ascending thoracic aortic aneurysm. Recommend annual imaging followup by CTA or MRA. This recommendation follows 2010 ACCF/AHA/AATS/ACR/ASA/SCA/SCAI/SIR/STS/SVM Guidelines for the Diagnosis and Management of Patients with Thoracic Aortic Disease. Circulation. 2010; 121: I967-E938. Aortic aneurysm NOS (ICD10-I71.9) Aortic Atherosclerosis (ICD10-I70.0). Electronically Signed   By: Ronney Asters M.D.   On: 12/06/2022 19:36   US Venous Img Lower Right (DVT Study)  Result Date:  12/06/2022 CLINICAL DATA:  Right leg pain.  Knee pain and swelling for 1 month. EXAM: RIGHT LOWER EXTREMITY VENOUS DOPPLER ULTRASOUND TECHNIQUE: Gray-scale sonography with compression, as well as color and duplex ultrasound, were performed to evaluate the deep venous system(s) from the level of the common femoral vein through the popliteal and proximal calf veins. COMPARISON:  None Available. FINDINGS: VENOUS Normal compressibility of the common femoral, superficial femoral, and popliteal veins, as well as the visualized calf veins. Visualized portions of profunda femoral vein and great saphenous vein unremarkable. No filling defects to suggest DVT on grayscale or color Doppler imaging. Doppler waveforms show normal direction of venous flow, normal respiratory plasticity and response to augmentation. Limited views of the contralateral common femoral vein are unremarkable. OTHER Popliteal fossa cyst measuring 4.6 x 1.6 x 4.7 cm it is minimally complex. Limitations: none IMPRESSION: 1. No evidence of right lower extremity DVT. 2. 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 Electronically Signed   By: Keith Rake M.D.   On: 12/06/2022 18:54   DG Chest 2 View  Result Date: 12/06/2022 CLINICAL DATA:  Shortness of breath for 2 days EXAM: CHEST - 2 VIEW COMPARISON:  07/07/2022 FINDINGS: Cardiac shadow is mildly enlarged. Aortic calcifications are seen. The lungs are well aerated bilaterally. No focal infiltrate or effusion is seen. No bony abnormality is noted. IMPRESSION: No acute abnormality seen. Electronically Signed   By: Inez Catalina M.D.   On: 12/06/2022 17:18    ____________________________________________   PROCEDURES  Procedure(s) performed:   Procedures  None  ____________________________________________   INITIAL IMPRESSION / ASSESSMENT AND PLAN / ED COURSE  Pertinent labs & imaging results that were available during my care of the patient were reviewed by me and  considered in my medical decision making (see chart for details).   This patient is Presenting for Evaluation of CP, which does require a range of treatment options, and is a complaint that involves a high risk of morbidity and mortality.  The Differential Diagnoses includes but is not exclusive to acute coronary syndrome, aortic dissection, pulmonary embolism, cardiac tamponade, community-acquired pneumonia, pericarditis, musculoskeletal chest wall pain, etc.   Critical Interventions-    Medications  albuterol (VENTOLIN HFA) 108 (90 Base) MCG/ACT inhaler 2 puff (has no administration in time range)  iohexol (OMNIPAQUE) 350 MG/ML injection 75 mL (75 mLs Intravenous Contrast Given 12/06/22 1905)  furosemide (LASIX) injection 40 mg (40 mg Intravenous Given 12/06/22 1959)    Reassessment after intervention: Symptoms improved.    I did obtain Additional Historical Information from daughter at bedside.   I decided to review pertinent External Data, and in summary patient with labs in the [facility] system from today showing D-dimer of 1.3.   Clinical Laboratory Tests Ordered, included troponin within normal limits.  COVID, flu, RSV negative.  No leukocytosis or anemia.  Kidney function normal.  Radiologic Tests Ordered, included CXR, Venous US, and CAT PE. I independently interpreted the images and agree with radiology interpretation.   Cardiac Monitor Tracing which shows NSR.    Social Determinants of Health Risk patient is a non-smoker.   Medical Decision Making: Summary:  Patient presents emergency department for evaluation of chest pain and shortness of breath.  Arrives with tachycardia and mild tachypnea but no acute distress.  No hypotension.  D-dimer elevated at outside facility.  Plan for CTA PE and reassess.   Reevaluation with update and discussion with patient and daughter.  We discussed the imaging findings on CT including the thoracic aortic aneurysm requiring  follow-up/surveillance imaging.  Also discussed the Baker's cyst on DVT ultrasound although no DVT.  Negative for PE.  No hypoxemia, increased work of breathing, significant pulmonary edema.  Patient given a dose of Lasix here to begin diuresis and she is instructed to follow closely with her primary care physician as well as her cardiologist.  Her daughter tells me that she is seeing a orthopedist this week regarding her knee pain and we discussed the possibility of Baker's cyst causing some discomfort.  She will mention this during the appointment.   Considered admission but reassuring workup as above.  Patient appears stable for discharge at this time.  Patient's presentation is most consistent with acute presentation with potential threat to life or bodily function.   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  Nanda Quinton, MD, [facility]    Dorothie Wah, [facility], MD 12/06/22 [phone]

## 2022-12-06 NOTE — ED Notes (Addendum)
Pt.  Reports that she started having trouble breathing and having pain in the R upper chest and into the R side back on Sunday.  Pt. Reports she had blood work done this morning and the D dimer was elevated and the BMP was elevated.

## 2022-12-06 NOTE — ED Notes (Signed)
Written and verbal inst to pt and her family  Verbalized an understanding  To home with family  

## 2022-12-06 NOTE — ED Notes (Signed)
RT assessed patient for Albuterol need. Patient stated she has been more SOB with exertion, and having right sided pain. BBS clear and equal. MDI not needed at this time. Patient to go for CT.

## 2022-12-06 NOTE — ED Triage Notes (Addendum)
Pt reports was seen by PCP today for SOB x 2 days,( [facility])  Blood drawn and was instructed to come to ED due abnormal lab  D Dimer and BNP Reports pain under right breast on Sunday. Pain increases with breathing

## 2022-12-21 NOTE — Progress Notes (Signed)
Cardiology Office Note:    Date:  12/22/2022   ID:  Chelsea [name], DOB 08/06/1941, MRN [id number]  PCP:  Patient, No Pcp Per   [facility] Providers Cardiologist:  [name], MD Electrophysiologist:  Vickie Epley, MD     Referring MD: No ref. provider found   Chief Complaint: hospital follow-up  History of Present Illness:    Chelsea [name] is a very pleasant 82 y.o. female with a hx of permanent atrial fibrillation on chronic anticoagulation, HTN, medical noncompliance, and mitral valve regurgitation.   She was previously a patient of Dr. Mare Ferrari. She previously stopped Eliquis because of numbness in her fingertips. At one point she thought she was having an allergy to both Xarelto and Eliquis and was switched to Coumadin.  Unfortunately she stopped her Coumadin as well.  She did not wish to curve her diet of frequently eating vegetables, did not like weekly INR checks. A doctor in [place] told her she could take aspirin. Mild to moderate mitral regurgitation on echo 2016.  Last cardiology clinic visit was 06/02/2022 with Dr. Marlou Porch.  He expressed the importance of anticoagulation in the setting of atrial fibrillation.  She has had intolerances/noncompliance with all the anticoagulants.  Referred to Dr. Quentin Ore.  Explained that aspirin 81 mg daily alone does not fully protect her from risk of stroke. Advised to return in 1 year for follow-up.  She was referred to Dr. Quentin Ore for evaluation for potential Watchman LAA closure procedure, however she did not show up for her appointment on 07/12/22.  She reported she was leaving for [place] for a [place]-mont[place]rip with an unclear return date with a plan to reschedule January 2024.  Today, she is here alone for follow-up. [place] and d-dimer was elevated, so she was sent to ED. Had CTA chest that revealed no PE or other acute cardiopulmonary process, mild cardiomegaly, 4.3 cm ascending thoracic she  aortic aneurysm. Venous u/s negative for DVT. Troponin was negative, mildly elevated BNP. Says was told to f/u with cardiology. Reports her knee pain is chronic and stable. Knee injections have made knee pain worse. No chest pain. Had some pain a few weeks ago in her right shoulder and back that improved with Tylenol, now resolved. Feels her heart rate speed up if she is upset. Does exercises at home every morning. 1+ pitting edema bilateral LE edema. Does not cook with salt, does not like the taste of it. BP stable at home but her cuff is now broken.   Past Medical History:  Diagnosis Date   Arthritis    [place]    [place]   CHF (congestive heart failure) (HCC)    Hypertension     Past Surgical History:  Procedure Laterality Date   NO PAST SURGERIES      Current Medications: Current Meds  Medication Sig   acetaminophen (TYLENOL) 650 MG CR tablet Take 1,300 mg by mouth 2 (two) times daily.   apixaban (ELIQUIS) 5 MG TABS tablet Take 1 tablet (5 mg total) by mouth 2 (two) times daily.   diltiazem (CARDIZEM CD) 120 MG 24 hr capsule Take 1 capsule (120 mg total) by mouth daily.   furosemide (LASIX) 40 MG tablet Take 1 tablet by mouth daily.   latanoprost (XALATAN) 0.005 % ophthalmic solution 1 drop at bedtime.   metoprolol succinate (TOPROL-XL) 50 MG 24 hr tablet Take 1 tablet (50 mg  total) by mouth 2 (two) times daily. Take with or immediately following a meal.   VITAMIN D PO Take by mouth as directed.   [DISCONTINUED] aspirin EC 81 MG tablet Take 81 mg by mouth daily.     Allergies:   Eliquis [apixaban], Penicillins, Shrimp [shellfish allergy], and Xarelto [rivaroxaban]   Social History   Socioeconomic History   Marital status: Widowed    Spouse name: Not on file   Number of children: Not on file   Years of education: Not on file   Highest education level: Not on file  Occupational History   Not on file  Tobacco Use    Smoking status: Never   Smokeless tobacco: Never  Vaping Use   Vaping Use: Never used  Substance and Sexual Activity   Alcohol use: No   Drug use: No   Sexual activity: Never  Other Topics Concern   Not on file  Social History Narrative   Not on file   Social Determinants of Health   Financial Resource Strain: Not on file  Food Insecurity: Not on file  Transportation Needs: Not on file  Physical Activity: Not on file  Stress: Not on file  Social Connections: Not on file     Family History: The patient's family history includes Heart failure in her father.  ROS:   Please see the history of present illness.    + 1+ pitting edema bilateral LE + knee pain All other systems reviewed and are negative.  Labs/Other Studies Reviewed:    The following studies were reviewed today:  Echo 12/15/14 Left ventricle: The cavity size was normal. Wall thickness was    normal. Systolic function was normal. The estimated ejection    fraction was in the range of 55% to 60%. Wall motion was normal;    there were no regional wall motion abnormalities.  - Aortic valve: There was trivial regurgitation.  - Mitral valve: Mildly to moderately calcified annulus. There was    mild to moderate regurgitation.  - Left atrium: The atrium was moderately dilated.  - Right atrium: The atrium was mildly dilated.    Recent Labs: 12/06/2022: BUN 21; Creatinine, Ser 0.89; Hemoglobin 12.9; Platelets 274; Potassium 3.8; Sodium 139  Recent Lipid Panel    Component Value Date/Time   CHOL 199 04/23/2013 0445   TRIG 85 04/23/2013 0445   HDL 50 04/23/2013 0445   CHOLHDL 4.0 04/23/2013 0445   VLDL 17 04/23/2013 0445   LDLCALC 132 (H) 04/23/2013 0445     Risk Assessment/Calculations:    CHA2DS2-VASc Score = 5  This indicates a 7.2% annual risk of stroke. The patient's score is based upon: CHF History: 1 HTN History: 1 Diabetes History: 0 Stroke History: 0 Vascular Disease History: 0 Age Score:  2 Gender Score: 1    Physical Exam:    VS:  BP 130/70   Pulse 87   Ht 5' 4"$  (1.626 m)   Wt 186 lb 12.8 oz (84.7 kg)   LMP  (LMP Unknown)   SpO2 96%   BMI 32.06 kg/m     Wt Readings from Last 3 Encounters:  12/22/22 186 lb 12.8 oz (84.7 kg)  12/06/22 187 lb (84.8 kg)  07/07/22 137 lb (62.1 kg)     GEN:  Well nourished, well developed in no acute distress HEENT: Normal NECK: No JVD; No carotid bruits CARDIAC: Irregular RR, no murmurs, rubs, gallops RESPIRATORY:  Clear to auscultation without rales, wheezing or rhonchi  ABDOMEN: Soft,  non-tender, non-distended MUSCULOSKELETAL:  No edema; No deformity. 2+ pedal pulses, equal bilaterally SKIN: Warm and dry NEUROLOGIC:  Alert and oriented x 3 PSYCHIATRIC:  Normal affect   EKG:  EKG is not ordered today.  The ekg ordered today demonstrates    Diagnoses:    1. Permanent atrial fibrillation ([place])   2. 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 Chronic diastolic heart failure ([place])   4. Essential hypertension   5. Nonrheumatic mitral valve regurgitation   6. Abnormal laboratory test result   7. Bilateral leg edema    Assessment and Plan:      Elevated d dimer: ED admission for elevated d-dimer on 1/30/ per PCP. No evidence of DVT, PE. Reviewed these results and no indication for further testing at this time. As noted below, ascending aortic dilatation noted on CT, will recheck him 1 year.   Permanent atrial fibrillation: HR is well-controlled at 87 bpm. Concerned about recent tests in ED. Thankfully, there were no blood clots. Lengthy discussion about stroke risk with CHA2DS2-VASc score of 5. Agreeable to retry Eliquis 5 mg twice daily which is appropriate dose for her age/weight/Scr. She can go ahead and stop aspirin. Asymptomatic with a fib. Continue rate control with metoprolol, diltiazem.  Chronic HFpEF/Leg edema: Chronic 1+ pitting edema bilaterally that she feels is stable. Elevates legs when sedentary.  Limits sodium. Weight has been stable. No dyspnea, orthopnea, chest pain, PND. Continue Lasix.   Hypertension: BP is well controlled. We have given her a new BP cuff.   Thoracic aortic aneurysm: 4.3 cm ascending thoracic aortic aneurysm on CTA 12/06/22.  Avoid fluoroquinolone antibiotics, heavy lifting. Will repeat CT in 1 year for monitoring.  Mitral valve regurgitation: Mild to moderate MR on echo 2016. She is asymptomatic. I do not appreciate a significant murmur on exam. We will continue to monitor clinically for now.      Disposition: 1 year with Dr. Marlou Porch  Medication Adjustments/Labs and Tests Ordered: Current medicines are reviewed at length with the patient today.  Concerns regarding medicines are outlined above.  Orders Placed This Encounter  Procedures   CT ANGIO CHEST AORTA W/CM & OR WO/CM   Meds ordered this encounter  Medications   apixaban (ELIQUIS) 5 MG TABS tablet    Sig: Take 1 tablet (5 mg total) by mouth 2 (two) times daily.    Dispense:  60 tablet    Refill:  11    Patient Instructions  Medication Instructions:   START Eliquis one (1) tablet by mouth ( 5 mg) twice daily.   DISCONTINUE Asprin.  *If you need a refill on your cardiac medications before your next appointment, please call your pharmacy*   Lab Work:  None ordered.  If you have labs (blood work) drawn today and your tests are completely normal, you will receive your results only by: [place] (if you have MyChart) OR A paper copy in the mail If you have any lab test that is abnormal or we need to change your treatment, we will call you to review the results.   Testing/Procedures:   Non-Cardiac CT Angiography (CTA), is a special type of CT scan that uses a computer to produce multi-dimensional views of major blood vessels throughout the body. In CT angiography, a contrast material is injected through an IV to help visualize the blood vessels.IN ONE YEAR 2025.     Follow-Up: At  [facility], you and your health needs are our priority.  As part of our continuing mission to provide you with exceptional heart care, we have created designated Provider Care Teams.  These Care Teams include your primary Cardiologist (physician) and Advanced Practice Providers (APPs -  Physician Assistants and Nurse Practitioners) who all work together to provide you with the care you need, when you need it.  We recommend signing up for the patient portal called "MyChart".  Sign up information is provided on this After Visit Summary.  MyChart is used to connect with patients for Virtual Visits (Telemedicine).  Patients are able to view lab/test results, encounter notes, upcoming appointments, etc.  Non-urgent messages can be sent to your provider as well.   To learn more about what you can do with MyChart, go to [email or web address].    Your next appointment:   1 year(s)  Provider:   [name], MD     Other Instructions  Your physician wants you to follow-up in: 1 year with Dr. Marlou Porch.  You will receive a reminder letter in the mail two months in advance. If you don't receive a letter, please call our office to schedule the follow-up appointment.     Signed, Emmaline Life, NP  12/22/2022 4:21 PM    [place]

## 2022-12-22 ENCOUNTER — Ambulatory Visit: Payer: Medicare Other | Attending: Nurse Practitioner | Admitting: Nurse Practitioner

## 2022-12-22 ENCOUNTER — Encounter: Payer: Self-pay | Admitting: Nurse Practitioner

## 2022-12-22 ENCOUNTER — Telehealth: Payer: Self-pay | Admitting: Nurse Practitioner

## 2022-12-22 VITALS — BP 130/70 | HR 87 | Ht 64.0 in | Wt 186.8 lb

## 2022-12-22 DIAGNOSIS — I7121 Aneurysm of the ascending aorta, without rupture: Secondary | ICD-10-CM

## 2022-12-22 DIAGNOSIS — I5032 Chronic diastolic (congestive) heart failure: Secondary | ICD-10-CM | POA: Diagnosis present

## 2022-12-22 DIAGNOSIS — R600 Localized edema: Secondary | ICD-10-CM | POA: Diagnosis present

## 2022-12-22 DIAGNOSIS — R899 Unspecified abnormal finding in specimens from other organs, systems and tissues: Secondary | ICD-10-CM | POA: Diagnosis present

## 2022-12-22 DIAGNOSIS — I10 Essential (primary) hypertension: Secondary | ICD-10-CM

## 2022-12-22 DIAGNOSIS — I4821 Permanent atrial fibrillation: Secondary | ICD-10-CM

## 2022-12-22 DIAGNOSIS — I340 Nonrheumatic mitral (valve) insufficiency: Secondary | ICD-10-CM

## 2022-12-22 MED ORDER — APIXABAN 5 MG PO TABS: 5.0000 mg | ORAL_TABLET | Freq: Two times a day (BID) | ORAL | 11 refills | Status: AC

## 2022-12-22 NOTE — Patient Instructions (Addendum)
Medication Instructions:   START Eliquis one (1) tablet by mouth ( 5 mg) twice daily.   DISCONTINUE Asprin.  *If you need a refill on your cardiac medications before your next appointment, please call your pharmacy*   Lab Work:  None ordered.  If you have labs (blood work) drawn today and your tests are completely normal, you will receive your results only by: [place] (if you have MyChart) OR A paper copy in the mail If you have any lab test that is abnormal or we need to change your treatment, we will call you to review the results.   Testing/Procedures:   Non-Cardiac CT Angiography (CTA), is a special type of CT scan that uses a computer to produce multi-dimensional views of major blood vessels throughout the body. In CT angiography, a contrast material is injected through an IV to help visualize the blood vessels.IN ONE YEAR 2025.     Follow-Up: At [facility], you and your health needs are our priority.  As part of our continuing mission to provide you with exceptional heart care, we have created designated Provider Care Teams.  These Care Teams include your primary Cardiologist (physician) and Advanced Practice Providers (APPs -  Physician Assistants and Nurse Practitioners) who all work together to provide you with the care you need, when you need it.  We recommend signing up for the patient portal called "MyChart".  Sign up information is provided on this After Visit Summary.  MyChart is used to connect with patients for Virtual Visits (Telemedicine).  Patients are able to view lab/test results, encounter notes, upcoming appointments, etc.  Non-urgent messages can be sent to your provider as well.   To learn more about what you can do with MyChart, go to [email or web address].    Your next appointment:   1 year(s)  Provider:   [name], MD     Other Instructions  Your physician wants you to follow-up in: 1 year with Dr. Marlou Porch.  You will receive a  reminder letter in the mail two months in advance. If you don't receive a letter, please call our office to schedule the follow-up appointment.

## 2022-12-22 NOTE — Telephone Encounter (Signed)
Pt c/o medication issue:  1. Name of Medication: apixaban (ELIQUIS) 5 MG TABS tablet   2. How are you currently taking this medication (dosage and times per day)? Take 1 tablet (5 mg total) by mouth 2 (two) times daily.   3. Are you having a reaction (difficulty breathing--STAT)? No   4. What is your medication issue? Patient has had a reaction in the pass to the medication Eliquis. Patient reaction to medication is rashes all over back and bloody, purplish bruises on arm and enhanced allergies. Also have questions about visit summary.

## 2022-12-22 NOTE — Telephone Encounter (Signed)
Spoke with pt, she stated that everything is fine. There maybe an language barrier, asked about the possible allergic reactions. Pt stated this was in the past and her PCP said everything is fine. Will forward to APP.

## 2023-01-27 IMAGING — US US EXTREM LOW VENOUS*L*
1 series · 13 of 24 positions shown · non-contrast
Comparison: None.

CLINICAL DATA: 79-year-old female with left lower extremity
swelling.

EXAM:
LEFT LOWER EXTREMITY VENOUS DOPPLER ULTRASOUND
TECHNIQUE: Gray-scale sonography with graded compression, as well as color
Doppler and duplex ultrasound were performed to evaluate the left
lower extremity deep venous systems from the level of the common
femoral vein and including the common femoral, femoral, profunda
femoral, popliteal and calf veins including the posterior tibial,
peroneal and gastrocnemius veins when visible. Spectral Doppler was
utilized to evaluate flow at rest and with distal augmentation
maneuvers in the common femoral, femoral and popliteal veins. The
contralateral common femoral vein was also evaluated for comparison.

[Series 1: us extrem low venous*left* · 13 of 40 slices shown]
[im 1/40]
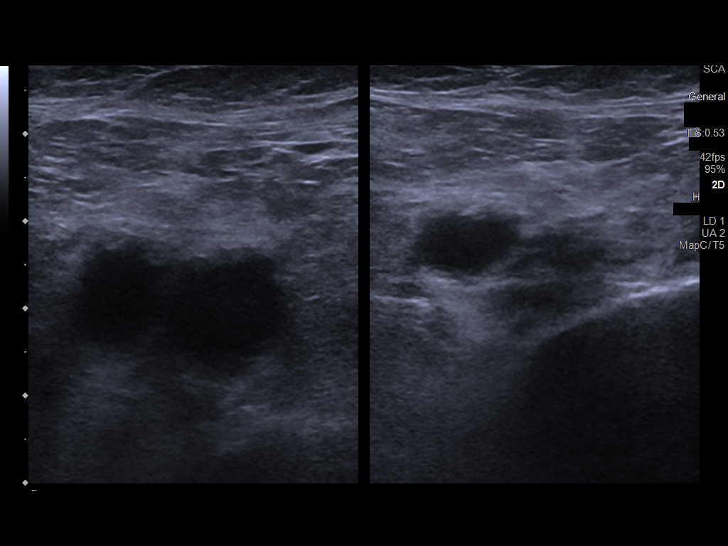
[im 4/40]
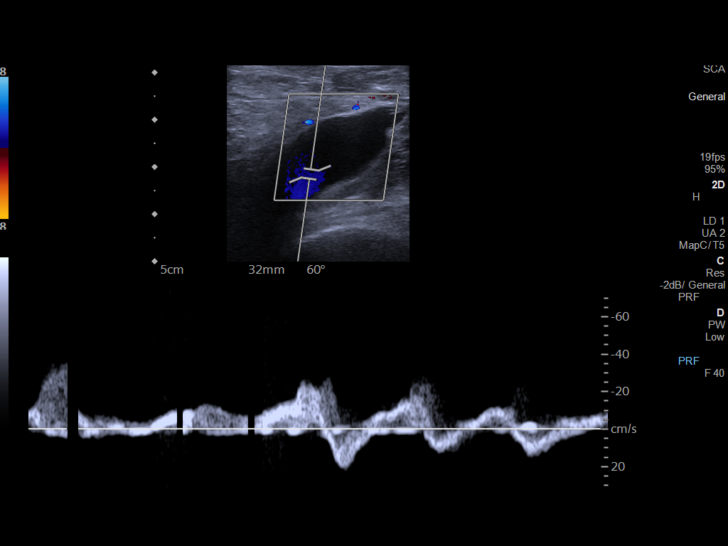
[im 7/40]
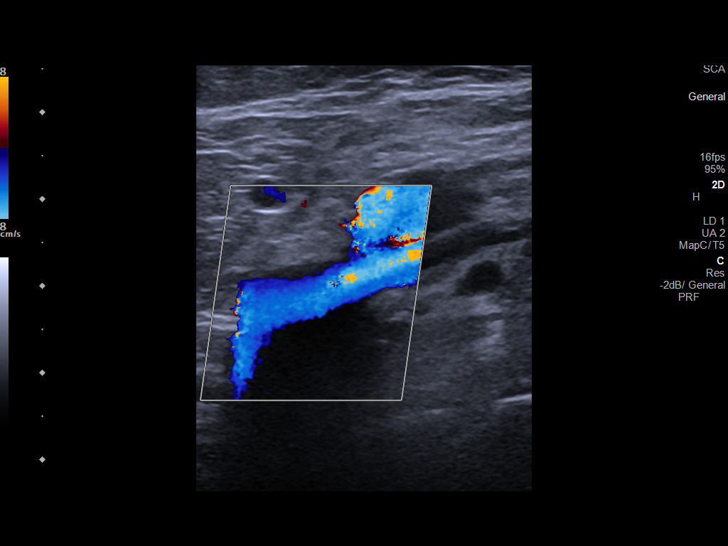
[im 11/40]
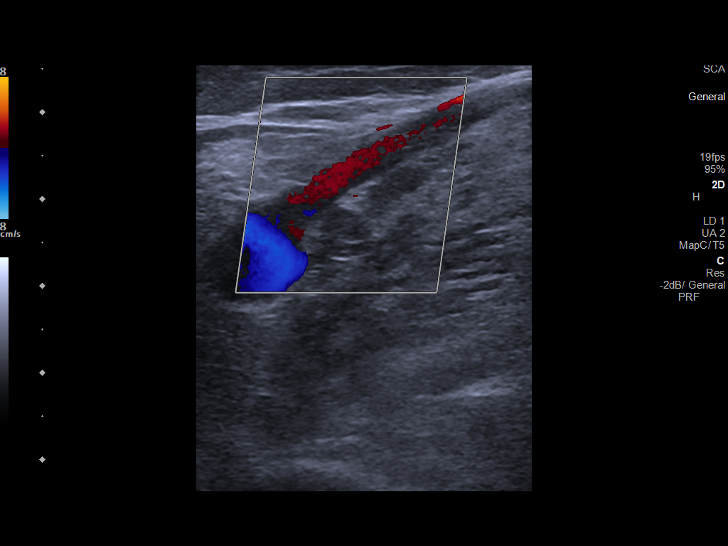
[im 14/40]
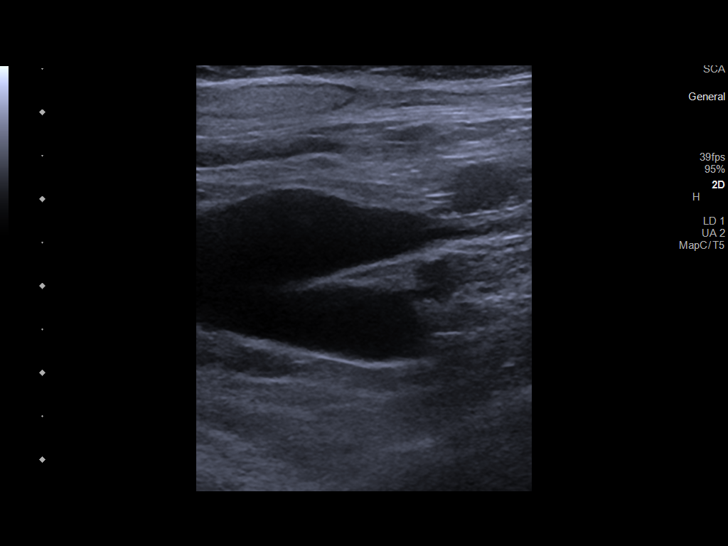
[im 17/40]
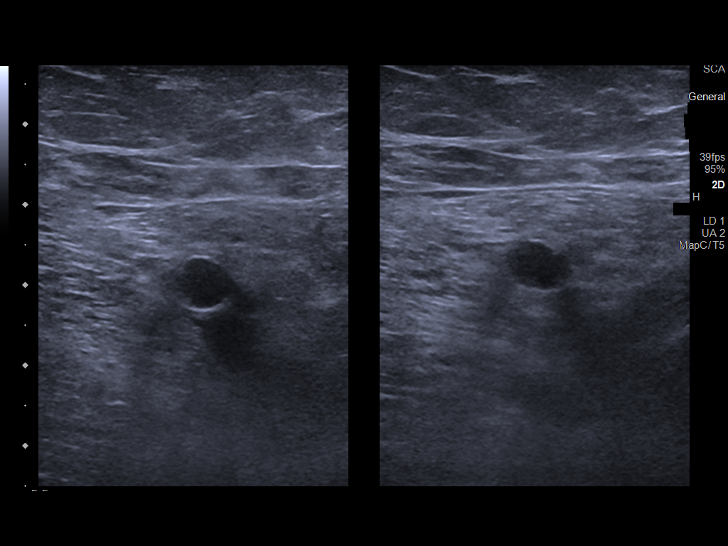
[im 21/40]
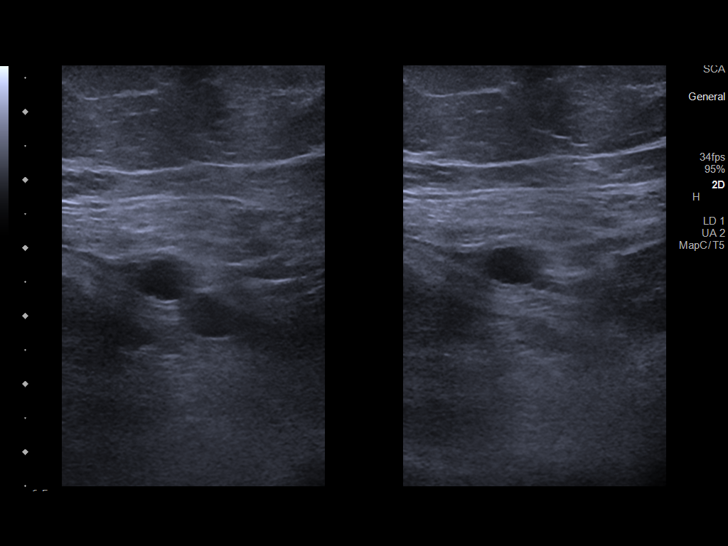
[im 23/40]
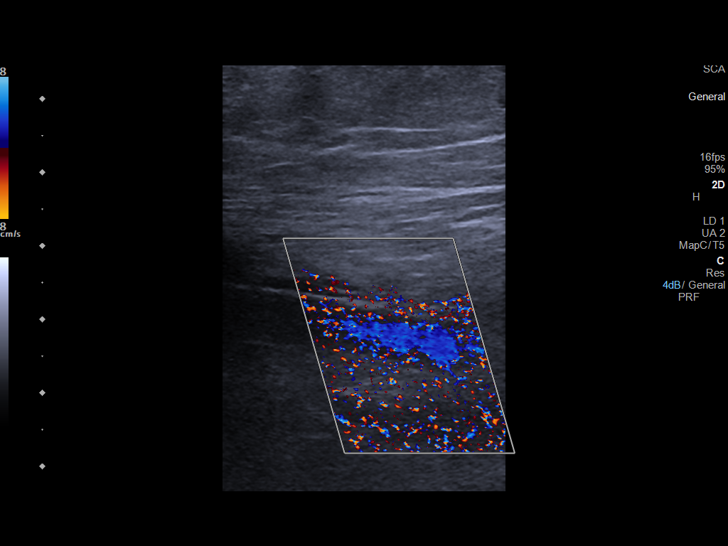
[im 26/40]
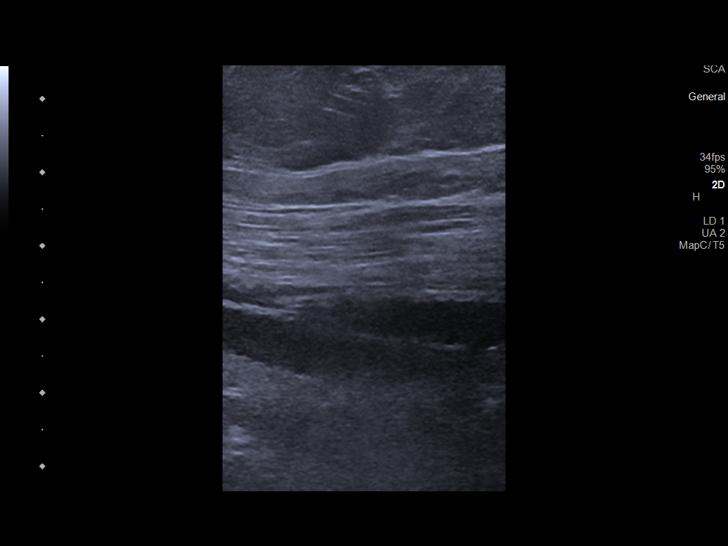
[im 29/40]
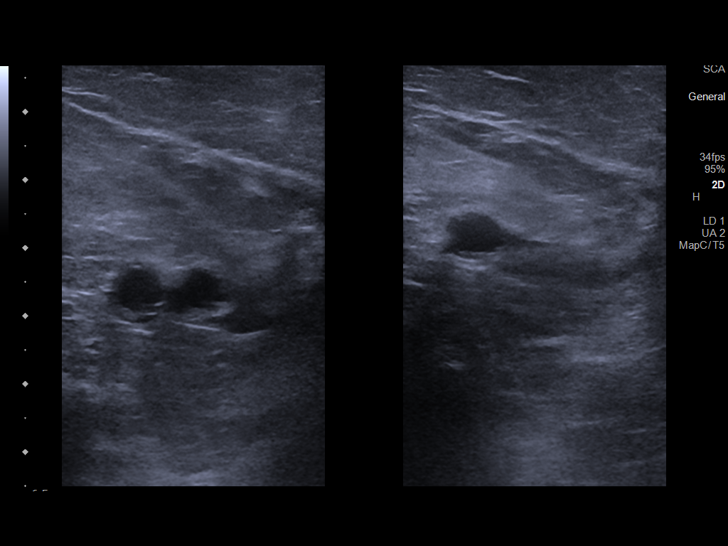
[im 33/40]
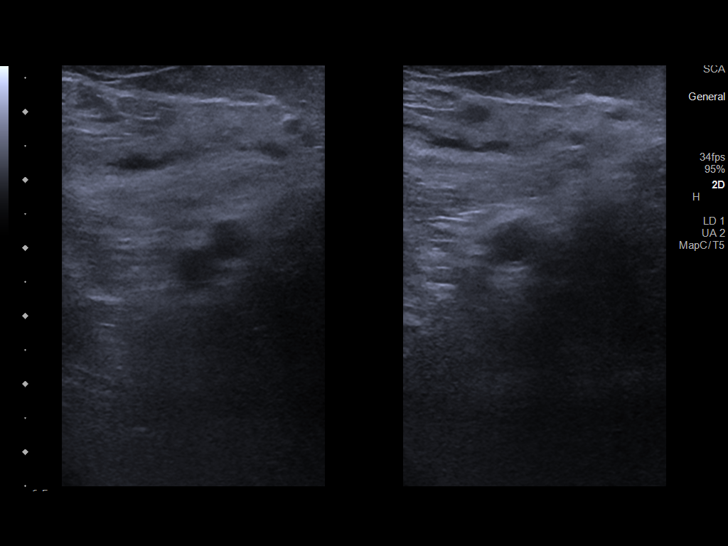
[im 36/40]
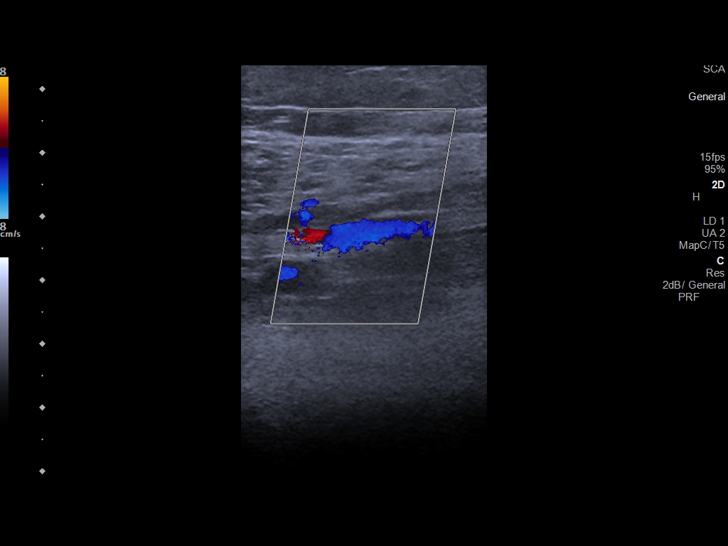
[im 40/40]
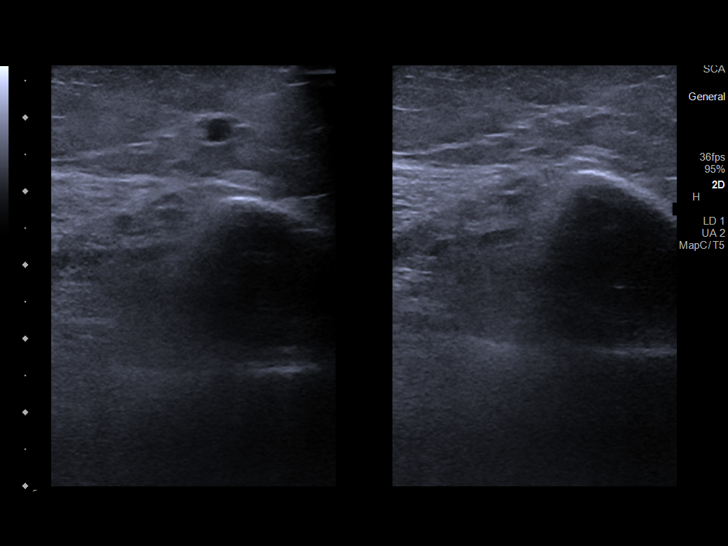

[13 of 24 positions shown; findings below may reference images not displayed]

FINDINGS: LEFT LOWER EXTREMITY

Common Femoral Vein: No evidence of thrombus. Normal
compressibility, respiratory phasicity and response to augmentation.

Central Greater Saphenous Vein: No evidence of thrombus. Normal
compressibility and flow on color Doppler imaging.

Central Profunda Femoral Vein: No evidence of thrombus. Normal
compressibility and flow on color Doppler imaging.

Femoral Vein: No evidence of thrombus. Normal compressibility,
respiratory phasicity and response to augmentation.

Popliteal Vein: No evidence of thrombus. Normal compressibility,
respiratory phasicity and response to augmentation.

Calf Veins: No evidence of thrombus. Normal compressibility and flow
on color Doppler imaging.

Other Findings:  None.

RIGHT LOWER EXTREMITY

Common Femoral Vein: No evidence of thrombus. Normal
compressibility, respiratory phasicity and response to augmentation.
IMPRESSION: No evidence of left lower extremity deep venous thrombosis.

## 2023-02-20 ENCOUNTER — Encounter: Payer: Self-pay | Admitting: *Deleted

## 2023-07-14 ENCOUNTER — Other Ambulatory Visit: Payer: Self-pay | Admitting: Cardiology

## 2023-08-25 ENCOUNTER — Other Ambulatory Visit: Payer: Self-pay | Admitting: Cardiology

## 2023-10-19 ENCOUNTER — Encounter: Payer: Self-pay | Admitting: Orthopaedic Surgery

## 2023-10-19 ENCOUNTER — Other Ambulatory Visit: Payer: Self-pay

## 2023-10-19 ENCOUNTER — Ambulatory Visit (INDEPENDENT_AMBULATORY_CARE_PROVIDER_SITE_OTHER): Payer: Medicare Other | Admitting: Orthopaedic Surgery

## 2023-10-19 ENCOUNTER — Other Ambulatory Visit (INDEPENDENT_AMBULATORY_CARE_PROVIDER_SITE_OTHER): Payer: Medicare Other

## 2023-10-19 DIAGNOSIS — M170 Bilateral primary osteoarthritis of knee: Secondary | ICD-10-CM | POA: Diagnosis not present

## 2023-10-19 DIAGNOSIS — M1711 Unilateral primary osteoarthritis, right knee: Secondary | ICD-10-CM | POA: Diagnosis not present

## 2023-10-19 NOTE — Progress Notes (Signed)
Office Visit Note   Patient: Chelsea [name]           Date of Birth: 03/07/1941           MRN: [id number] Visit Date: 10/19/2023              Requested by: No referring provider defined for this encounter. PCP: Patient, No Pcp Per   Assessment & Plan: Visit Diagnoses:  1. Primary osteoarthritis of right knee     Plan: Impression is severe right knee degenerative joint disease secondary to Osteoarthritis.  Bone on bone joint space narrowing is seen on radiographs with significant varus alignment and lateral tibial subluxation.  At this point, conservative treatments fail to provide any significant relief and the pain is severely affecting ADLs and quality of life.  Based on treatment options, the patient has elected to move forward with a knee replacement.  We have discussed the surgical risks that include but are not limited to infection, DVT, leg length discrepancy, stiffness, numbness, tingling, incomplete relief of pain.  Recovery and prognosis were also reviewed.    Anticoagulants: Eliquis (apixaban) daily Postop anticoagulation: Eliquis Diabetic: No  Nickel allergy: No Prior DVT/PE: No Tobacco use: No Clearances needed for surgery: Ernesta Amble Anticipated discharge dispo: Home   Follow-Up Instructions: No follow-ups on file.   Orders:  Orders Placed This Encounter  Procedures   XR KNEE 3 VIEW RIGHT   XR KNEE 3 VIEW LEFT   No orders of the defined types were placed in this encounter.     Procedures: No procedures performed   Clinical Data: No additional findings.   Subjective: Chief Complaint  Patient presents with   Right Knee - Pain   Left Knee - Pain    HPI Patient is a very pleasant 82 year old [place] female here with her daughter for evaluation of bilateral knee pain mainly the right.  She has had severe pain for 2 years.  She has had numerous injections in [place] as well as flex agenic's that have not been helpful.  She walks  with a cane.  She has nighttime pain.  She has severe pain constantly.  Review of Systems  Constitutional: Negative.   HENT: Negative.    Eyes: Negative.   Respiratory: Negative.    Cardiovascular: Negative.   Endocrine: Negative.   Musculoskeletal: Negative.   Neurological: Negative.   Hematological: Negative.   Psychiatric/Behavioral: Negative.    All other systems reviewed and are negative.    Objective: Vital Signs: LMP  (LMP Unknown)   Physical Exam Vitals and nursing note reviewed.  Constitutional:      Appearance: She is well-developed.  HENT:     Head: Atraumatic.     Nose: Nose normal.  Eyes:     Extraocular Movements: Extraocular movements intact.  Cardiovascular:     Pulses: Normal pulses.  Pulmonary:     Effort: Pulmonary effort is normal.  Abdominal:     Palpations: Abdomen is soft.  Musculoskeletal:     Cervical back: Neck supple.  Skin:    General: Skin is warm.     Capillary Refill: Capillary refill takes less than 2 seconds.  Neurological:     Mental Status: She is alert. Mental status is at baseline.  Psychiatric:        Behavior: Behavior normal.        Thought Content: Thought content normal.        Judgment: Judgment normal.     Ortho Exam  Exam of the right knee shows severe pain to the medial side with crepitus and joint line tenderness.  Collaterals and cruciates are stable.  Range of motion 0 to 90 degrees with pain.  Exam of the left knee shows no joint effusion.  Close the cruciates are stable.  Mild pain with range of motion.  No specialty comments available.  Imaging: No results found.   PMFS History: Patient Active Problem List   Diagnosis Date Noted   Primary osteoarthritis of left knee 02/23/2021   Ankle impingement syndrome, left 02/23/2021   Primary osteoarthritis of right knee 12/03/2020   Chronic anticoagulation 03/06/2015   Edema-dopplers negative for DVT 03/06/2015   CHF exacerbation (HCC)    SOB (shortness of  breath)    Chest pain    Acute on chronic diastolic congestive heart failure (HCC)    Atrial fibrillation with RVR (HCC) 03/04/2015   Essential hypertension 02/24/2015   Chronic diastolic heart failure (HCC) 12/10/2014   Permanent atrial fibrillation (HCC) 12/10/2014   Paroxysmal nocturnal dyspnea 12/10/2014   Past Medical History:  Diagnosis Date   Arthritis    Atrial fibrillation (HCC)    Atrial fibrillation with RVR (HCC) 03/04/2015   CHF (congestive heart failure) (HCC)    Hypertension     Family History  Problem Relation Age of Onset   Heart failure Father     Past Surgical History:  Procedure Laterality Date   NO PAST SURGERIES     Social History   Occupational History   Not on file  Tobacco Use   Smoking status: Never   Smokeless tobacco: Never  Vaping Use   Vaping status: Never Used  Substance and Sexual Activity   Alcohol use: No   Drug use: No   Sexual activity: Never

## 2023-10-27 ENCOUNTER — Telehealth: Payer: Self-pay | Admitting: Orthopaedic Surgery

## 2023-10-27 ENCOUNTER — Telehealth: Payer: Self-pay

## 2023-10-27 NOTE — Telephone Encounter (Signed)
   Pre-operative Risk Assessment    Patient Name: Chelsea [name]  DOB: 09-04-1941 MRN: [id number]      Request for Surgical Clearance    Procedure:   RIGHT TOTAL KNEE  Date of Surgery:  Clearance TBD                                 Surgeon:  DR. Etter Sjogren XU Surgeon's Group or Practice Name:  [facility]  Phone number:  [phone] Fax number:  [phone]   Type of Clearance Requested:   - Pharmacy:  Hold Apixaban (Eliquis) X 3 DAYS PRIOR    Type of Anesthesia:  Spinal   Additional requests/questions:    SignedMichaelle Copas   10/27/2023, 5:14 PM

## 2023-10-27 NOTE — Telephone Encounter (Signed)
Patient's daughter [name] calling regarding mother's surgery.  Surgical clearance requests were faxed today to both PCP and Cardiologist.  Patient's daughter will follow up with both offices on Monday 10-30-23 to see if they will require a visit or if they will provide clearance. [name] would like to know if her mother can have in home home health care prior to surgery and after.  She states her mother has been independent up until the knee problem.  She is by herself most of the time and is now having difficulty moving about with the increased pain in the knee.  Please advise.  Cindy's mobile:  [phone]

## 2023-10-29 NOTE — Telephone Encounter (Signed)
No HHPT prior to surgery but I'm happy to send her to outpatient PT

## 2023-10-30 ENCOUNTER — Telehealth: Payer: Self-pay

## 2023-10-30 NOTE — Telephone Encounter (Signed)
Patient with diagnosis of A Fib on Eliquis for anticoagulation.    Procedure: RIGHT TOTAL KNEE  Date of procedure: TBD   CHA2DS2-VASc Score = 6  This indicates a 9.7% annual risk of stroke. The patient's score is based upon: CHF History: 1 HTN History: 1 Diabetes History: 0 Stroke History: 0 Vascular Disease History: 1 Age Score: 2 Gender Score: 1     CrCl 66 ml/min    Platelet count 274K   Per office protocol, patient can hold Eliquis for 3 days prior to procedure.   .  **This guidance is not considered finalized until pre-operative APP has relayed final recommendations.**

## 2023-10-30 NOTE — Telephone Encounter (Signed)
   Name: Chelsea [name]  DOB: [birth year]  MRN: [id number]  Primary Cardiologist: Donato Schultz, MD   Preoperative team, please contact this patient and set up a phone call appointment for further preoperative risk assessment. Please obtain consent and complete medication review. Thank you for your help.  I confirm that guidance regarding antiplatelet and oral anticoagulation therapy has been completed and, if necessary, noted below.  Per office protocol, patient can hold Eliquis for 3 days prior to procedure.   I also confirmed the patient resides in the state of [place]. As per [facility] Medical Board telemedicine laws, the patient must reside in the state in which the provider is licensed.   Ronney Asters, NP 10/30/2023, 4:12 PM [facility]

## 2023-10-30 NOTE — Telephone Encounter (Signed)
  Patient Consent for Virtual Visit        Chelsea [name] has provided verbal consent on 10/30/2023 for a virtual visit (video or telephone).   CONSENT FOR VIRTUAL VISIT FOR:  Chelsea [name]  By participating in this virtual visit I agree to the following:  I hereby voluntarily request, consent and authorize [facility] and its employed or contracted physicians, physician assistants, nurse practitioners or other licensed health care professionals (the Practitioner), to provide me with telemedicine health care services (the "Services") as deemed necessary by the treating Practitioner. I acknowledge and consent to receive the Services by the Practitioner via telemedicine. I understand that the telemedicine visit will involve communicating with the Practitioner through live audiovisual communication technology and the disclosure of certain medical information by electronic transmission. I acknowledge that I have been given the opportunity to request an in-person assessment or other available alternative prior to the telemedicine visit and am voluntarily participating in the telemedicine visit.  I understand that I have the right to withhold or withdraw my consent to the use of telemedicine in the course of my care at any time, without affecting my right to future care or treatment, and that the Practitioner or I may terminate the telemedicine visit at any time. I understand that I have the right to inspect all information obtained and/or recorded in the course of the telemedicine visit and may receive copies of available information for a reasonable fee.  I understand that some of the potential risks of receiving the Services via telemedicine include:  Delay or interruption in medical evaluation due to technological equipment failure or disruption; Information transmitted may not be sufficient (e.g. poor resolution of images) to allow for appropriate medical decision making by the Practitioner;  and/or  In rare instances, security protocols could fail, causing a breach of personal health information.  Furthermore, I acknowledge that it is my responsibility to provide information about my medical history, conditions and care that is complete and accurate to the best of my ability. I acknowledge that Practitioner's advice, recommendations, and/or decision may be based on factors not within their control, such as incomplete or inaccurate data provided by me or distortions of diagnostic images or specimens that may result from electronic transmissions. I understand that the practice of medicine is not an exact science and that Practitioner makes no warranties or guarantees regarding treatment outcomes. I acknowledge that a copy of this consent can be made available to me via my patient portal [facility] MyChart), or I can request a printed copy by calling the office of [facility].    I understand that my insurance will be billed for this visit.   I have read or had this consent read to me. I understand the contents of this consent, which adequately explains the benefits and risks of the Services being provided via telemedicine.  I have been provided ample opportunity to ask questions regarding this consent and the Services and have had my questions answered to my satisfaction. I give my informed consent for the services to be provided through the use of telemedicine in my medical care

## 2023-10-30 NOTE — Telephone Encounter (Signed)
Spoke with patient's daughter (DPR on file) who is agreeable for patient to do a tele visit on 12/31 at 10:40 am. Med rec and consent done.

## 2023-11-07 ENCOUNTER — Ambulatory Visit: Payer: Medicare Other | Attending: Physician Assistant

## 2023-11-07 DIAGNOSIS — Z01810 Encounter for preprocedural cardiovascular examination: Secondary | ICD-10-CM

## 2023-11-07 NOTE — Progress Notes (Signed)
 Virtual Visit via Telephone Note   Because of Chelsea [name]'s co-morbid illnesses, she is at least at moderate risk for complications without adequate follow up.  This format is felt to be most appropriate for this patient at this time.  The patient did not have access to video technology/had technical difficulties with video requiring transitioning to audio format only (telephone).  All issues noted in this document were discussed and addressed.  No physical exam could be performed with this format.  Please refer to the patient's chart for her consent to telehealth for [facility].  Evaluation Performed:  Preoperative cardiovascular risk assessment _____________   Date:  11/07/2023   Patient ID:  Chelsea [name], DOB [birth year], MRN [id number] Patient Location:  Home Provider location:   Office  Primary Care Provider:  Patient, No Pcp Per Primary Cardiologist:  Oneil Parchment, MD  Chief Complaint / Patient Profile   82 y.o. y/o female with a h/o permanent atrial fibrillation on chronic anticoagulation, hypertension, medical noncompliance, and mitral valve regurgitation who is pending right total knee and presents today for telephonic preoperative cardiovascular risk assessment.  History of Present Illness    Chelsea [name] is a 82 y.o. female who presents via audio/video conferencing for a telehealth visit today.  Pt was last seen in cardiology clinic on 12/22/2022 by Rosaline Bane, NP.  At that time Chelsea [name] was doing well other than some edema.  The patient is now pending procedure as outlined above. Since her last visit, she tells me that she has not had any chest pains or shortness of breath.  She cannot walk 1-2 blocks due to her right leg pain.  She also has some weakness in the side and some instability due to her knee.  However, she still is able to do indoor housework and she takes care of her yard as well.  She plans to do physical therapy after her knee surgery.  She  does meet 4 METS on the DASI.   Per office protocol, patient can hold Eliquis  for 3 days prior to procedure.  Please resume when medically safe to do so.  Past Medical History    Past Medical History:  Diagnosis Date   Arthritis    Atrial fibrillation [facility])    Atrial fibrillation with RVR (HCC) 03/04/2015   CHF (congestive heart failure) (HCC)    Hypertension    Past Surgical History:  Procedure Laterality Date   NO PAST SURGERIES      Allergies  Allergies  Allergen Reactions   Eliquis  [Apixaban ] Rash   Penicillins Other (See Comments)   Shrimp [Shellfish Allergy] Hives   Xarelto  [Rivaroxaban ] Rash    Home Medications    Prior to Admission medications   Medication Sig Start Date End Date Taking? Authorizing Provider  acetaminophen  (TYLENOL ) 650 MG CR tablet Take 1,300 mg by mouth 2 (two) times daily.    [provider]  apixaban  (ELIQUIS ) 5 MG TABS tablet Take 1 tablet (5 mg total) by mouth 2 (two) times daily. 12/22/22   Swinyer, Rosaline HERO, NP  diltiazem  (CARDIZEM  CD) 120 MG 24 hr capsule TAKE 1 CAPSULE BY MOUTH EVERY DAY 07/14/23   Parchment Oneil BROCKS, MD  furosemide  (LASIX ) 40 MG tablet Take 1 tablet by mouth daily. 10/13/22   [provider]  latanoprost (XALATAN) 0.005 % ophthalmic solution 1 drop at bedtime. 10/13/22   [provider]  metoprolol  succinate (TOPROL -XL) 50 MG 24 hr tablet Take 1 tablet (50 mg total) by  mouth 2 (two) times daily. TAKE WITH OR IMMEDIATELY FOLLOWING A MEAL. 08/25/23   Jeffrie Oneil BROCKS, MD  VITAMIN D  PO Take by mouth as directed.    [provider]    Physical Exam    Vital Signs:  Ishanvi Tamer does not have vital signs available for review today.  Given telephonic nature of communication, physical exam is limited. AAOx3. NAD. Normal affect.  Speech and respirations are unlabored.  Accessory Clinical Findings    None  Assessment & Plan    1.  Preoperative Cardiovascular Risk Assessment:  Chelsea [name]'s  perioperative risk of a major cardiac event is 0.9% according to the Revised Cardiac Risk Index (RCRI).  Therefore, she is at low risk for perioperative complications.   Her functional capacity is fair at 4.61 METs according to the [place] Activity Status Index (DASI). Recommendations: According to ACC/AHA guidelines, no further cardiovascular testing needed.  The patient may proceed to surgery at acceptable risk.   Antiplatelet and/or Anticoagulation Recommendations:  Eliquis  (Apixaban ) can be held for 3 days prior to surgery.  Please resume post op when felt to be safe.    A copy of this note will be routed to requesting surgeon.  Time:   Today, I have spent 5 minutes with the patient with telehealth technology discussing medical history, symptoms, and management plan.     Chelsea LOISE Fabry, PA-C  11/07/2023, 9:11 AM

## 2023-11-09 ENCOUNTER — Telehealth: Payer: Self-pay | Admitting: Cardiology

## 2023-11-09 NOTE — Telephone Encounter (Signed)
 I s/w Doyal at PCP office and clarified about the request for the records from clearance. Doyal said the surgeon office had sent their office as well for preop clearance. Doyal stated if we have cleared the pt she will let the primary care doctor that we have cleared the pt already. I did tell Doyal that I will send as FYI notes to their office as well to have the notes in the pt's chart. Doyal thanked me for the help.

## 2023-11-09 NOTE — Telephone Encounter (Signed)
 Caller Lafonda Mosses) wants copy of patient's office notes from her pre-op clearance faxed to them at fax# [phone].

## 2023-11-09 NOTE — Telephone Encounter (Signed)
 I will re-fax notes providing clearance to the requesting.

## 2023-11-28 ENCOUNTER — Other Ambulatory Visit: Payer: Self-pay | Admitting: Physician Assistant

## 2023-11-28 MED ORDER — OXYCODONE-ACETAMINOPHEN 5-325 MG PO TABS: 1.0000 | ORAL_TABLET | Freq: Four times a day (QID) | ORAL | 0 refills | Status: DC | PRN

## 2023-11-28 MED ORDER — ONDANSETRON HCL 4 MG PO TABS: 4.0000 mg | ORAL_TABLET | Freq: Three times a day (TID) | ORAL | 0 refills | Status: DC | PRN

## 2023-11-28 MED ORDER — METHOCARBAMOL 750 MG PO TABS: 750.0000 mg | ORAL_TABLET | Freq: Two times a day (BID) | ORAL | 2 refills | Status: DC | PRN

## 2023-11-28 MED ORDER — DOCUSATE SODIUM 100 MG PO CAPS: 100.0000 mg | ORAL_CAPSULE | Freq: Every day | ORAL | 2 refills | Status: DC | PRN

## 2023-11-29 NOTE — Progress Notes (Signed)
Surgical Instructions   Your procedure is scheduled on Wednesday December 06, 2023. Report to [facility] Main Entrance "A" at 7:25 A.M., then check in with the Admitting office. Any questions or running late day of surgery: call [phone]  Questions prior to your surgery date: call [phone], Monday-Friday, 8am-4pm. If you experience any cold or flu symptoms such as cough, fever, chills, shortness of breath, etc. between now and your scheduled surgery, please notify us at the above number.     Remember:  Do not eat after midnight the night before your surgery  You may drink clear liquids until 6:55 the morning of your surgery.   Clear liquids allowed are: Water, Non-Citrus Juices (without pulp), Carbonated Beverages, Clear Tea (no milk, honey, etc.), Black Coffee Only (NO MILK, CREAM OR POWDERED CREAMER of any kind), and Gatorade.  Patient Instructions  The night before surgery:  No food after midnight. ONLY clear liquids after midnight  The day of surgery (if you do NOT have diabetes):  Drink ONE (1) Pre-Surgery Clear Ensure by 6:55 the morning of surgery. Drink in one sitting. Do not sip.  This drink was given to you during your hospital  pre-op appointment visit.  Nothing else to drink after completing the  Pre-Surgery Clear Ensure.         If you have questions, please contact your surgeon's office.  Take these medicines the morning of surgery with A SIP OF WATER  acetaminophen (TYLENOL)  diltiazem (CARDIZEM CD)  metoprolol succinate (TOPROL-XL)   May take these medicines IF NEEDED: methocarbamol (ROBAXIN-750)  ondansetron (ZOFRAN)  oxyCODONE-acetaminophen (PERCOCET)   PER YOUR CARDIOLOGIST'S INSTRUCTIONS, DO NOT TAKE YOUR apixaban (ELIQUIS) 3 DAYS PRIOR TO SURGERY, WITH THE LAST DOSE BEING 12/02/2023.    One week prior to surgery, STOP taking any Aspirin (unless otherwise instructed by your surgeon) Aleve, Naproxen, Ibuprofen, Motrin, Advil, Goody's, BC's, all  herbal medications, fish oil, and non-prescription vitamins.                     Do NOT Smoke (Tobacco/Vaping) for 24 hours prior to your procedure.  If you use a CPAP at night, you may bring your mask/headgear for your overnight stay.   You will be asked to remove any contacts, glasses, piercing's, hearing aid's, dentures/partials prior to surgery. Please bring cases for these items if needed.    Patients discharged the day of surgery will not be allowed to drive home, and someone needs to stay with them for 24 hours.  SURGICAL WAITING ROOM VISITATION Patients may have no more than 2 support people in the waiting area - these visitors may rotate.   Pre-op nurse will coordinate an appropriate time for 1 ADULT support person, who may not rotate, to accompany patient in pre-op.  Children under the age of 39 must have an adult with them who is not the patient and must remain in the main waiting area with an adult.  If the patient needs to stay at the hospital during part of their recovery, the visitor guidelines for inpatient rooms apply.  Please refer to the [facility] website for the visitor guidelines for any additional information.   If you received a COVID test during your pre-op visit  it is requested that you wear a mask when out in public, stay away from anyone that may not be feeling well and notify your surgeon if you develop symptoms. If you have been in contact with anyone that has tested positive  in the last 10 days please notify you surgeon.      Pre-operative 5 CHG Bathing Instructions   You can play a key role in reducing the risk of infection after surgery. Your skin needs to be as free of germs as possible. You can reduce the number of germs on your skin by washing with CHG (chlorhexidine gluconate) soap before surgery. CHG is an antiseptic soap that kills germs and continues to kill germs even after washing.   DO NOT use if you have an allergy to chlorhexidine/CHG or  antibacterial soaps. If your skin becomes reddened or irritated, stop using the CHG and notify one of our RNs at [phone].   Please shower with the CHG soap starting 4 days before surgery using the following schedule:     Please keep in mind the following:  DO NOT shave, including legs and underarms, starting the day of your first shower.   You may shave your face at any point before/day of surgery.  Place clean sheets on your bed the day you start using CHG soap. Use a clean washcloth (not used since being washed) for each shower. DO NOT sleep with pets once you start using the CHG.   CHG Shower Instructions:  Wash your face and private area with normal soap. If you choose to wash your hair, wash first with your normal shampoo.  After you use shampoo/soap, rinse your hair and body thoroughly to remove shampoo/soap residue.  Turn the water OFF and apply about 3 tablespoons (45 ml) of CHG soap to a CLEAN washcloth.  Apply CHG soap ONLY FROM YOUR NECK DOWN TO YOUR TOES (washing for 3-5 minutes)  DO NOT use CHG soap on face, private areas, open wounds, or sores.  Pay special attention to the area where your surgery is being performed.  If you are having back surgery, having someone wash your back for you may be helpful. Wait 2 minutes after CHG soap is applied, then you may rinse off the CHG soap.  Pat dry with a clean towel  Put on clean clothes/pajamas   If you choose to wear lotion, please use ONLY the CHG-compatible lotions that are listed below.  Additional instructions for the day of surgery: DO NOT APPLY any lotions, deodorants or perfumes.   Do not bring valuables to the hospital. [facility] is not responsible for any belongings/valuables. Do not wear nail polish, gel polish, artificial nails, or any other type of covering on natural nails (fingers and toes) Do not wear jewelry or makeup Put on clean/comfortable clothes.  Please brush your teeth.  Ask your nurse before  applying any prescription medications to the skin.     CHG Compatible Lotions   Aveeno Moisturizing lotion  Cetaphil Moisturizing Cream  Cetaphil Moisturizing Lotion  Clairol Herbal Essence Moisturizing Lotion, Dry Skin  Clairol Herbal Essence Moisturizing Lotion, Extra Dry Skin  Clairol Herbal Essence Moisturizing Lotion, Normal Skin  Curel Age Defying Therapeutic Moisturizing Lotion with Alpha Hydroxy  Curel Extreme Care Body Lotion  Curel Soothing Hands Moisturizing Hand Lotion  Curel Therapeutic Moisturizing Cream, Fragrance-Free  Curel Therapeutic Moisturizing Lotion, Fragrance-Free  Curel Therapeutic Moisturizing Lotion, Original Formula  Eucerin Daily Replenishing Lotion  Eucerin Dry Skin Therapy Plus Alpha Hydroxy Crme  Eucerin Dry Skin Therapy Plus Alpha Hydroxy Lotion  Eucerin Original Crme  Eucerin Original Lotion  Eucerin Plus Crme Eucerin Plus Lotion  Eucerin TriLipid Replenishing Lotion  Keri Anti-Bacterial Hand Lotion  Keri Deep Conditioning Original Lotion Dry  Skin Formula Softly Scented  Keri Deep Conditioning Original Lotion, Fragrance Free Sensitive Skin Formula  Keri Lotion Fast Absorbing Fragrance Free Sensitive Skin Formula  Keri Lotion Fast Absorbing Softly Scented Dry Skin Formula  Keri Original Lotion  Keri Skin Renewal Lotion Keri Silky Smooth Lotion  Keri Silky Smooth Sensitive Skin Lotion  Nivea Body Creamy Conditioning Oil  Nivea Body Extra Enriched Lotion  Nivea Body Original Lotion  Nivea Body Sheer Moisturizing Lotion Nivea Crme  Nivea Skin Firming Lotion  NutraDerm 30 Skin Lotion  NutraDerm Skin Lotion  NutraDerm Therapeutic Skin Cream  NutraDerm Therapeutic Skin Lotion  ProShield Protective Hand Cream  Provon moisturizing lotion  Please read over the following fact sheets that you were given.

## 2023-11-30 ENCOUNTER — Other Ambulatory Visit: Payer: Self-pay

## 2023-11-30 ENCOUNTER — Encounter (HOSPITAL_COMMUNITY)
Admission: RE | Admit: 2023-11-30 | Discharge: 2023-11-30 | Disposition: A | Discharge status: Home / Self Care | Payer: Medicare Other | Source: Ambulatory Visit | Attending: Orthopaedic Surgery | Admitting: Orthopaedic Surgery

## 2023-11-30 ENCOUNTER — Encounter (HOSPITAL_COMMUNITY): Payer: Self-pay

## 2023-11-30 DIAGNOSIS — Z01818 Encounter for other preprocedural examination: Secondary | ICD-10-CM | POA: Diagnosis present

## 2023-11-30 DIAGNOSIS — M1711 Unilateral primary osteoarthritis, right knee: Secondary | ICD-10-CM | POA: Insufficient documentation

## 2023-11-30 DIAGNOSIS — Z01812 Encounter for preprocedural laboratory examination: Secondary | ICD-10-CM | POA: Insufficient documentation

## 2023-11-30 HISTORY — DX: Sleep apnea, unspecified: G47.30

## 2023-11-30 LAB — BASIC METABOLIC PANEL
Anion gap: 10 (ref 5–15)
BUN: 10 mg/dL (ref 8–23)
CO2: 26 mmol/L (ref 22–32)
Calcium: 9.1 mg/dL (ref 8.9–10.3)
Chloride: 103 mmol/L (ref 98–111)
Creatinine, Ser: 1.05 mg/dL — ABNORMAL HIGH (ref 0.44–1.00)
GFR, Estimated: 53 mL/min — ABNORMAL LOW (ref >60)
Glucose, Bld: 141 mg/dL — ABNORMAL HIGH (ref 70–99)
Potassium: 3.8 mmol/L (ref 3.5–5.1)
Sodium: 139 mmol/L (ref 135–145)

## 2023-11-30 LAB — CBC
HCT: 41 % (ref 36.0–46.0)
Hemoglobin: 12.8 g/dL (ref 12.0–15.0)
MCH: 29.3 pg (ref 26.0–34.0)
MCHC: 31.2 g/dL (ref 30.0–36.0)
MCV: 93.8 fL (ref 80.0–100.0)
Platelets: 270 10*3/uL (ref 150–400)
RBC: 4.37 MIL/uL (ref 3.87–5.11)
RDW: 13.2 % (ref 11.5–15.5)
WBC: 10.7 10*3/uL — ABNORMAL HIGH (ref 4.0–10.5)
nRBC: 0 % (ref 0.0–0.2)

## 2023-11-30 NOTE — Progress Notes (Signed)
PCP - [facility] Cardiologist - Dr. Donato Schultz (clearance 11/07/23)  PPM/ICD - Denies Device Orders - n/a Rep Notified - n/a  Chest x-ray - 12/06/22 EKG - 12/06/22 Stress Test - denies ECHO - 12/15/14 Cardiac Cath - denies  Sleep Study - yes CPAP - yes, patient states she does not wear a cpap  Non-Diabetic  Blood Thinner Instructions: Eliquis Last dose is 12/02/23 Aspirin Instructions: Denies  ERAS Protcol - ERAS until 6:55 PRE-SURGERY Ensure or G2- Ensure  COVID TEST- N   Anesthesia review: Y, HTN, CHF, A-fib w/RVR on eliquis   Patient denies shortness of breath, fever, cough and chest pain at PAT appointment. Patient refuses  any respiratory issues at this time.    All instructions explained to the patient, with a verbal understanding of the material. Patient agrees to go over the instructions while at home for a better understanding. Patient also instructed to self quarantine after being tested for COVID-19. The opportunity to ask questions was provided.  \

## 2023-12-01 ENCOUNTER — Emergency Department (HOSPITAL_COMMUNITY): Payer: Medicare Other

## 2023-12-01 ENCOUNTER — Emergency Department (HOSPITAL_COMMUNITY)
Admission: EM | Admit: 2023-12-01 | Discharge: 2023-12-01 | Disposition: A | Discharge status: Home / Self Care | Payer: Medicare Other | Attending: Emergency Medicine | Admitting: Emergency Medicine

## 2023-12-01 ENCOUNTER — Encounter (HOSPITAL_COMMUNITY): Payer: Self-pay

## 2023-12-01 ENCOUNTER — Emergency Department (HOSPITAL_BASED_OUTPATIENT_CLINIC_OR_DEPARTMENT_OTHER): Payer: Medicare Other

## 2023-12-01 ENCOUNTER — Other Ambulatory Visit: Payer: Self-pay

## 2023-12-01 DIAGNOSIS — M7122 Synovial cyst of popliteal space [Baker], left knee: Secondary | ICD-10-CM

## 2023-12-01 DIAGNOSIS — D72829 Elevated white blood cell count, unspecified: Secondary | ICD-10-CM | POA: Diagnosis not present

## 2023-12-01 DIAGNOSIS — M79662 Pain in left lower leg: Secondary | ICD-10-CM | POA: Insufficient documentation

## 2023-12-01 DIAGNOSIS — M7989 Other specified soft tissue disorders: Secondary | ICD-10-CM

## 2023-12-01 DIAGNOSIS — L03116 Cellulitis of left lower limb: Secondary | ICD-10-CM

## 2023-12-01 DIAGNOSIS — R600 Localized edema: Secondary | ICD-10-CM | POA: Insufficient documentation

## 2023-12-01 DIAGNOSIS — Z7901 Long term (current) use of anticoagulants: Secondary | ICD-10-CM | POA: Insufficient documentation

## 2023-12-01 LAB — CBC WITH DIFFERENTIAL/PLATELET
Abs Immature Granulocytes: 0.05 10*3/uL (ref 0.00–0.07)
Basophils Absolute: 0.1 10*3/uL (ref 0.0–0.1)
Basophils Relative: 1 %
Eosinophils Absolute: 0.4 10*3/uL (ref 0.0–0.5)
Eosinophils Relative: 3 %
HCT: 40.9 % (ref 36.0–46.0)
Hemoglobin: 12.7 g/dL (ref 12.0–15.0)
Immature Granulocytes: 0 %
Lymphocytes Relative: 38 %
Lymphs Abs: 4.5 10*3/uL — ABNORMAL HIGH (ref 0.7–4.0)
MCH: 29.3 pg (ref 26.0–34.0)
MCHC: 31.1 g/dL (ref 30.0–36.0)
MCV: 94.2 fL (ref 80.0–100.0)
Monocytes Absolute: 1.1 10*3/uL — ABNORMAL HIGH (ref 0.1–1.0)
Monocytes Relative: 10 %
Neutro Abs: 5.7 10*3/uL (ref 1.7–7.7)
Neutrophils Relative %: 48 %
Platelets: 253 10*3/uL (ref 150–400)
RBC: 4.34 MIL/uL (ref 3.87–5.11)
RDW: 13.1 % (ref 11.5–15.5)
WBC: 11.7 10*3/uL — ABNORMAL HIGH (ref 4.0–10.5)
nRBC: 0 % (ref 0.0–0.2)

## 2023-12-01 LAB — COMPREHENSIVE METABOLIC PANEL
ALT: 9 U/L (ref 0–44)
AST: 14 U/L — ABNORMAL LOW (ref 15–41)
Albumin: 3.4 g/dL — ABNORMAL LOW (ref 3.5–5.0)
Alkaline Phosphatase: 83 U/L (ref 38–126)
Anion gap: 12 (ref 5–15)
BUN: 14 mg/dL (ref 8–23)
CO2: 25 mmol/L (ref 22–32)
Calcium: 8.9 mg/dL (ref 8.9–10.3)
Chloride: 103 mmol/L (ref 98–111)
Creatinine, Ser: 0.89 mg/dL (ref 0.44–1.00)
GFR, Estimated: >60 mL/min (ref >60)
Glucose, Bld: 110 mg/dL — ABNORMAL HIGH (ref 70–99)
Potassium: 3.9 mmol/L (ref 3.5–5.1)
Sodium: 140 mmol/L (ref 135–145)
Total Bilirubin: 0.8 mg/dL (ref 0.0–1.2)
Total Protein: 6.7 g/dL (ref 6.5–8.1)

## 2023-12-01 LAB — BRAIN NATRIURETIC PEPTIDE: B Natriuretic Peptide: 231.1 pg/mL — ABNORMAL HIGH (ref 0.0–100.0)

## 2023-12-01 MED ORDER — CEPHALEXIN 500 MG PO CAPS: 500.0000 mg | ORAL_CAPSULE | Freq: Four times a day (QID) | ORAL | 0 refills | Status: DC

## 2023-12-01 MED ORDER — CEPHALEXIN 250 MG PO CAPS
1000.0000 mg | ORAL_CAPSULE | Freq: Once | ORAL | Status: AC
  Administered 2023-12-01: 1000 mg via ORAL
  Filled 2023-12-01: qty 4

## 2023-12-01 NOTE — ED Provider Notes (Signed)
Patient signed out to me at 1530 by Dr. West Bali pending DVT ultrasound.  In short this is an 83 year old female with a past medical history of A-fib on Eliquis, hypertension, CHF that presented to the emergency department with left foot leg swelling.  Patient had labs and ankle x-ray performed.  Labs showed mild leukocytosis and a minimally elevated BNP, foot x-ray showed possible lesion on the calcaneus with edema.  On my evaluation, the patient has edematous bilateral lower extremities that are nonpitting.  She is having pain in her left lateral ankle and left first toe, does have a small nonbleeding abrasion on her left first toe, no wounds on the heel and no bony tenderness to the calcaneus making a osteo unlikely.  Patient's swelling may be secondary to her Baker's cyst that was seen on her ultrasound versus an early cellulitis and will be started on short course of antibiotics.  No evidence of DVT on ultrasound.  Recommended compression stockings, ice and elevation and outpatient follow-up.   Rexford Maus, DO 12/01/23 1616

## 2023-12-01 NOTE — Progress Notes (Signed)
Anesthesia Chart Review:  83 year old female follows with cardiology for history of permanent A-fib on Eliquis, HTN, mitral valve regurgitation seen by Jari Favre, PA-C 11/07/2023 for preop evaluation.  Per note, "Ms. Mcquarrie's perioperative risk of a major cardiac event is 0.9% according to the Revised Cardiac Risk Index (RCRI).  Therefore, she is at low risk for perioperative complications.   Her functional capacity is fair at 4.61 METs according to the Duke Activity Status Index (DASI). Recommendations: According to ACC/AHA guidelines, no further cardiovascular testing needed.  The patient may proceed to surgery at acceptable risk. Antiplatelet and/or Anticoagulation Recommendations: Eliquis (Apixaban) can be held for 3 days prior to surgery.  Please resume post op when felt to be safe."    TTE 12/15/2014: - Left ventricle: The cavity size was normal. Wall thickness was    normal. Systolic function was normal. The estimated ejection    fraction was in the range of 55% to 60%. Wall motion was normal;    there were no regional wall motion abnormalities.  - Aortic valve: There was trivial regurgitation.  - Mitral valve: Mildly to moderately calcified annulus. There was    mild to moderate regurgitation.  - Left atrium: The atrium was moderately dilated.  - Right atrium: The atrium was mildly dilated.

## 2023-12-01 NOTE — Progress Notes (Signed)
LLE venous duplex has been completed.  Preliminary results given to Dr. Theresia Lo.    Results can be found under chart review under CV PROC. 12/01/2023 3:50 PM Kaylon Laroche RVT, RDMS

## 2023-12-01 NOTE — ED Provider Notes (Signed)
[place] EMERGENCY DEPARTMENT AT [facility] Provider Note   CSN: [id number] Arrival date & time: 12/01/23  1008     History  Chief Complaint  Patient presents with   Foot Pain    Chelsea [name] is a 83 y.o. female.  Pt c/o left foot, ankle and lower leg swelling in the past few days. Denies trauma or injury to area. No fever or chills. No hx dvt or pe. No chest pain or sob.   The history is provided by the patient, medical records and a relative.  Foot Pain Pertinent negatives include no chest pain, no abdominal pain and no shortness of breath.       Home Medications Prior to Admission medications   Medication Sig Start Date End Date Taking? Authorizing Provider  docusate sodium (COLACE) 100 MG capsule Take 1 capsule (100 mg total) by mouth daily as needed. 11/28/23 11/27/24  Cristie Hem, PA-C  methocarbamol (ROBAXIN-750) 750 MG tablet Take 1 tablet (750 mg total) by mouth 2 (two) times daily as needed for muscle spasms. 11/28/23   Cristie Hem, PA-C  ondansetron (ZOFRAN) 4 MG tablet Take 1 tablet (4 mg total) by mouth every 8 (eight) hours as needed for nausea or vomiting. 11/28/23   Cristie Hem, PA-C  oxyCODONE-acetaminophen (PERCOCET) 5-325 MG tablet Take 1-2 tablets by mouth every 6 (six) hours as needed. To be taken after surgery 11/28/23   Cristie Hem, PA-C  acetaminophen (TYLENOL) 650 MG CR tablet Take 1,300 mg by mouth 2 (two) times daily.    [provider]  apixaban (ELIQUIS) 5 MG TABS tablet Take 1 tablet (5 mg total) by mouth 2 (two) times daily. 12/22/22   Swinyer, Zachary George, NP  azelastine (OPTIVAR) 0.05 % ophthalmic solution Place 1 drop into the left eye daily. Patient not taking: Reported on 11/30/2023 10/23/23   [provider]  colchicine 0.6 MG tablet Take 0.6 mg by mouth daily. Patient not taking: Reported on 11/30/2023 11/11/23   [provider]  diltiazem (CARDIZEM CD) 120 MG 24 hr capsule TAKE 1  CAPSULE BY MOUTH EVERY DAY 07/14/23   Jake Bathe, MD  furosemide (LASIX) 40 MG tablet Take 1 tablet by mouth daily. 10/13/22   [provider]  HYDROcodone-acetaminophen (NORCO/VICODIN) 5-325 MG tablet Take 1 tablet by mouth every 6 (six) hours as needed for moderate pain (pain score 4-6) or severe pain (pain score 7-10). Patient not taking: Reported on 11/30/2023 11/11/23   [provider]  latanoprost (XALATAN) 0.005 % ophthalmic solution 1 drop at bedtime. Patient not taking: Reported on 11/30/2023 10/13/22   [provider]  metoprolol succinate (TOPROL-XL) 50 MG 24 hr tablet Take 1 tablet (50 mg total) by mouth 2 (two) times daily. TAKE WITH OR IMMEDIATELY FOLLOWING A MEAL. 08/25/23   Jake Bathe, MD      Allergies    Eliquis [apixaban], Penicillins, Shrimp [shellfish allergy], and Xarelto [rivaroxaban]    Review of Systems   Review of Systems  Constitutional:  Negative for chills and fever.  Eyes:  Negative for pain.  Respiratory:  Negative for shortness of breath.   Cardiovascular:  Negative for chest pain.  Gastrointestinal:  Negative for abdominal pain.  Musculoskeletal:        Left foot,ankle swelling.   Skin:  Negative for rash.  Neurological:  Negative for weakness and numbness.    Physical Exam Updated Vital Signs BP 123/60 (BP Location: Right Arm)  Pulse 75   Temp 98.6 F (37 C) (Oral)   Resp 17   Ht 1.575 m (5\' 2" )   Wt 84.8 kg   LMP  (LMP Unknown)   SpO2 100%   BMI 34.20 kg/m  Physical Exam Vitals and nursing note reviewed.  Constitutional:      Appearance: Normal appearance. She is well-developed.  HENT:     Head: Atraumatic.     Nose: Nose normal.     Mouth/Throat:     Mouth: Mucous membranes are moist.  Eyes:     General: No scleral icterus.    Conjunctiva/sclera: Conjunctivae normal.  Neck:     Trachea: No tracheal deviation.  Cardiovascular:     Rate and Rhythm: Normal rate and regular rhythm.     Pulses: Normal  pulses.     Heart sounds: Normal heart sounds. No murmur heard.    No friction rub. No gallop.  Pulmonary:     Effort: Pulmonary effort is normal. No respiratory distress.     Breath sounds: Normal breath sounds.  Abdominal:     General: There is no distension.     Tenderness: There is no abdominal tenderness.  Musculoskeletal:     Cervical back: Neck supple. No muscular tenderness.     Comments: Swelling and tenderness left foot and ankle and lower leg, moderate. Left foot is mildly erythematous and mildly warm compared to right. Distal pulses palp. Compartments of left foot and lower leg are soft, not tense. Mild right foot, ankle edema - pt indicates is normal for her.   Skin:    General: Skin is warm and dry.     Findings: No rash.  Neurological:     Mental Status: She is alert.     Comments: Alert, speech normal.   Psychiatric:        Mood and Affect: Mood normal.     ED Results / Procedures / Treatments   Labs (all labs ordered are listed, but only abnormal results are displayed) Results for orders placed or performed during the hospital encounter of 12/01/23  CBC with Differential/Platelet   Collection Time: 12/01/23  1:50 PM  Result Value Ref Range   WBC 11.7 (H) 4.0 - 10.5 K/uL   RBC 4.34 3.87 - 5.11 MIL/uL   Hemoglobin 12.7 12.0 - 15.0 g/dL   HCT 16.1 09.6 - 04.5 %   MCV 94.2 80.0 - 100.0 fL   MCH 29.3 26.0 - 34.0 pg   MCHC 31.1 30.0 - 36.0 g/dL   RDW 40.9 81.1 - 91.4 %   Platelets 253 150 - 400 K/uL   nRBC 0.0 0.0 - 0.2 %   Neutrophils Relative % 48 %   Neutro Abs 5.7 1.7 - 7.7 K/uL   Lymphocytes Relative 38 %   Lymphs Abs 4.5 (H) 0.7 - 4.0 K/uL   Monocytes Relative 10 %   Monocytes Absolute 1.1 (H) 0.1 - 1.0 K/uL   Eosinophils Relative 3 %   Eosinophils Absolute 0.4 0.0 - 0.5 K/uL   Basophils Relative 1 %   Basophils Absolute 0.1 0.0 - 0.1 K/uL   Immature Granulocytes 0 %   Abs Immature Granulocytes 0.05 0.00 - 0.07 K/uL   DG Ankle Complete  Left Result Date: 12/01/2023 CLINICAL DATA:  Pain and swelling EXAM: LEFT ANKLE COMPLETE - 3 VIEW COMPARISON:  None Available. FINDINGS: Global soft tissue swelling about the ankle. There is lucency with cortical irregularity along the anterior aspect of the calcaneus on the  lateral view. There is also some irregularity along the subtalar joint. Osteopenia. No dislocation or fracture. IMPRESSION: Global soft tissue swelling the ankle with erosive appearance to the anterior aspect the calcaneus on lateral view. An aggressive lesion is possible. Please correlate for any clinical signs of infection. Otherwise further workup is recommended with MRI or bone scan to further delineate. Electronically Signed   By: Karen Kays M.D.   On: 12/01/2023 11:40     EKG None  Radiology DG Ankle Complete Left Result Date: 12/01/2023 CLINICAL DATA:  Pain and swelling EXAM: LEFT ANKLE COMPLETE - 3 VIEW COMPARISON:  None Available. FINDINGS: Global soft tissue swelling about the ankle. There is lucency with cortical irregularity along the anterior aspect of the calcaneus on the lateral view. There is also some irregularity along the subtalar joint. Osteopenia. No dislocation or fracture. IMPRESSION: Global soft tissue swelling the ankle with erosive appearance to the anterior aspect the calcaneus on lateral view. An aggressive lesion is possible. Please correlate for any clinical signs of infection. Otherwise further workup is recommended with MRI or bone scan to further delineate. Electronically Signed   By: Karen Kays M.D.   On: 12/01/2023 11:40    Procedures Procedures    Medications Ordered in ED Medications - No data to display  ED Course/ Medical Decision Making/ A&P                                 Medical Decision Making Problems Addressed: Pain and swelling of left lower leg: acute illness or injury Swelling of left foot: acute illness or injury  Amount and/or Complexity of Data  Reviewed Independent Historian:     Details: Family, hx External Data Reviewed: notes. Labs: ordered. Decision-making details documented in ED Course. Radiology: independent interpretation performed. Decision-making details documented in ED Course.  Risk Prescription drug management.   Iv ns.  Labs ordered/sent. Imaging ordered.   Differential diagnosis includes dvt, peripheral edema, venous stasis, cellulitis, etc. Dispo decision including potential need for admission considered - will get labs and imaging and reassess.   Reviewed nursing notes and prior charts for additional history. External reports reviewed. Additional history from: family.   Labs reviewed/interpreted by me - wbc 11.7, hgb normal.  Other labs pending.   Xrays reviewed/interpreted by me - no fx. No pens wounds on calcaneus, no focal heel pain/swelling.  Skin of foot/toes is dry cracked.   Vascular U/s  reviewed/interpreted by me - pnd.  1514, labs and vascular u/s pending - signed out to Dr Theresia Lo if vascular u/s negative for dvt, would tx for mild/early cellulitis, pcp f/u.             Final Clinical Impression(s) / ED Diagnoses Final diagnoses:  None    Rx / DC Orders ED Discharge Orders     None         Cathren Laine, MD 12/01/23 1515

## 2023-12-01 NOTE — ED Triage Notes (Addendum)
Pt came to ED for left foot swelling and is keeping pt up at night. No heart issues known. Pt has bilateral leg swelling. Pt urinating normally. Denies weight gain. Denies recent falls

## 2023-12-01 NOTE — ED Notes (Signed)
Patient transported to Ultrasound

## 2023-12-01 NOTE — ED Provider Triage Note (Signed)
Emergency Medicine Provider Triage Evaluation Note  Chelsea [name] , a 83 y.o. female  was evaluated in triage.  Pt complains of ongoing pain in the left ankle with worsening swelling.  She reports issues with her right knee for some time and needs a knee replacement but the swelling in her left ankle has been getting worse.  Seems to be the worst at night.  She has been trying to rub it down but is not helping.  She denies any fevers.  No recent change in medications.  She denies any pain in the right ankle but does appear to have swelling as well.  Denies any trauma  Review of Systems  Positive: Pain and swelling in the left ankle, bilateral leg swelling and ongoing knee pain Negative: Chest pain, shortness of breath  Physical Exam  BP 123/60 (BP Location: Right Arm)   Pulse 75   Temp 98.6 F (37 C) (Oral)   Resp 17   Ht 5\' 2"  (1.575 m)   Wt 84.8 kg   LMP  (LMP Unknown)   SpO2 100%   BMI 34.20 kg/m  Gen:   Awake, no distress   Resp:  Normal effort  MSK:   Nonpitting edema noted in bilateral lower extremities.  Tenderness with palpation of the left ankle over the lateral malleolus without ecchymosis.  No significant foot swelling.  2+ DP pulses palpated, left toes are slightly cool to the touch Other:  Cardiac exam with normal rate  Medical Decision Making  Medically screening exam initiated at 10:52 AM.  Appropriate orders placed.  Chelsea [name] was informed that the remainder of the evaluation will be completed by another provider, this initial triage assessment does not replace that evaluation, and the importance of remaining in the ED until their evaluation is complete.     Gwyneth Sprout, MD 12/01/23 1054

## 2023-12-01 NOTE — Discharge Instructions (Signed)
You were seen in the emergency department for your leg swelling.  You had no signs of blood clots.  You do have a Baker's cyst behind your left knee which may be contributing to the swelling and some signs of early infection.  I have given you a course of antibiotics you should complete this as prescribed.  I have also given you compression stockings that you can wear to help with the swelling and you can ice behind your knees and keep your legs elevated when you are resting to help with the swelling.  You should follow-up with your primary doctor to make sure that your infection is improving and can follow-up with your orthopedic doctor as needed if you are having pain and discomfort from the Baker's cyst.  You should return to the emergency department if you have increased redness streaking up your leg, you develop fevers despite the antibiotics or if you have any other new or concerning symptoms.

## 2023-12-02 ENCOUNTER — Encounter (HOSPITAL_COMMUNITY): Payer: Self-pay

## 2023-12-02 ENCOUNTER — Emergency Department (HOSPITAL_COMMUNITY)
Admission: EM | Admit: 2023-12-02 | Discharge: 2023-12-02 | Disposition: A | Discharge status: Home / Self Care | Payer: Medicare Other | Attending: Emergency Medicine | Admitting: Emergency Medicine

## 2023-12-02 ENCOUNTER — Other Ambulatory Visit: Payer: Self-pay

## 2023-12-02 DIAGNOSIS — Z79899 Other long term (current) drug therapy: Secondary | ICD-10-CM | POA: Diagnosis not present

## 2023-12-02 DIAGNOSIS — M7122 Synovial cyst of popliteal space [Baker], left knee: Secondary | ICD-10-CM | POA: Insufficient documentation

## 2023-12-02 DIAGNOSIS — I110 Hypertensive heart disease with heart failure: Secondary | ICD-10-CM | POA: Insufficient documentation

## 2023-12-02 DIAGNOSIS — Z7901 Long term (current) use of anticoagulants: Secondary | ICD-10-CM | POA: Insufficient documentation

## 2023-12-02 DIAGNOSIS — I509 Heart failure, unspecified: Secondary | ICD-10-CM | POA: Diagnosis not present

## 2023-12-02 DIAGNOSIS — M25562 Pain in left knee: Secondary | ICD-10-CM | POA: Diagnosis present

## 2023-12-02 MED ORDER — IBUPROFEN 200 MG PO TABS
600.0000 mg | ORAL_TABLET | Freq: Once | ORAL | Status: AC
  Administered 2023-12-02: 600 mg via ORAL
  Filled 2023-12-02: qty 3

## 2023-12-02 MED ORDER — OXYCODONE-ACETAMINOPHEN 5-325 MG PO TABS
1.0000 | ORAL_TABLET | Freq: Once | ORAL | Status: AC
  Administered 2023-12-02: 1 via ORAL
  Filled 2023-12-02: qty 1

## 2023-12-02 NOTE — ED Provider Notes (Addendum)
[place] EMERGENCY DEPARTMENT AT [facility] Provider Note   CSN: [id number] Arrival date & time: 12/02/23  1027     History  Chief Complaint  Patient presents with   Leg Pain    Bryce Kimble is a 83 y.o. female with A-fib on Eliquis, CHF, hypertension, presents with concern for left knee pain ongoing for the past week.  States it is more painful to bend her knee.  Reports mild swelling in the left knee.  Denies any fever or chills.  Denies any injuries to the area, numbness or tingling in the lower extremities.  She is seen in the ER last night and discharged with a course of antibiotics.  She states she took one of the antibiotic pills and her symptoms have not improved.   Leg Pain      Home Medications Prior to Admission medications   Medication Sig Start Date End Date Taking? Authorizing Provider  docusate sodium (COLACE) 100 MG capsule Take 1 capsule (100 mg total) by mouth daily as needed. 11/28/23 11/27/24  Cristie Hem, PA-C  methocarbamol (ROBAXIN-750) 750 MG tablet Take 1 tablet (750 mg total) by mouth 2 (two) times daily as needed for muscle spasms. 11/28/23   Cristie Hem, PA-C  ondansetron (ZOFRAN) 4 MG tablet Take 1 tablet (4 mg total) by mouth every 8 (eight) hours as needed for nausea or vomiting. 11/28/23   Cristie Hem, PA-C  oxyCODONE-acetaminophen (PERCOCET) 5-325 MG tablet Take 1-2 tablets by mouth every 6 (six) hours as needed. To be taken after surgery 11/28/23   Cristie Hem, PA-C  acetaminophen (TYLENOL) 650 MG CR tablet Take 1,300 mg by mouth 2 (two) times daily.    [provider]  apixaban (ELIQUIS) 5 MG TABS tablet Take 1 tablet (5 mg total) by mouth 2 (two) times daily. 12/22/22   Swinyer, Zachary George, NP  azelastine (OPTIVAR) 0.05 % ophthalmic solution Place 1 drop into the left eye daily. Patient not taking: Reported on 11/30/2023 10/23/23   [provider]  cephALEXin (KEFLEX) 500 MG capsule Take 1  capsule (500 mg total) by mouth 4 (four) times daily. 12/01/23   Elayne Snare K, DO  colchicine 0.6 MG tablet Take 0.6 mg by mouth daily. Patient not taking: Reported on 11/30/2023 11/11/23   [provider]  diltiazem (CARDIZEM CD) 120 MG 24 hr capsule TAKE 1 CAPSULE BY MOUTH EVERY DAY 07/14/23   Jake Bathe, MD  furosemide (LASIX) 40 MG tablet Take 1 tablet by mouth daily. 10/13/22   [provider]  HYDROcodone-acetaminophen (NORCO/VICODIN) 5-325 MG tablet Take 1 tablet by mouth every 6 (six) hours as needed for moderate pain (pain score 4-6) or severe pain (pain score 7-10). Patient not taking: Reported on 11/30/2023 11/11/23   [provider]  latanoprost (XALATAN) 0.005 % ophthalmic solution 1 drop at bedtime. Patient not taking: Reported on 11/30/2023 10/13/22   [provider]  metoprolol succinate (TOPROL-XL) 50 MG 24 hr tablet Take 1 tablet (50 mg total) by mouth 2 (two) times daily. TAKE WITH OR IMMEDIATELY FOLLOWING A MEAL. 08/25/23   Jake Bathe, MD      Allergies    Eliquis [apixaban], Penicillins, Shrimp [shellfish allergy], and Xarelto [rivaroxaban]    Review of Systems   Review of Systems  Musculoskeletal:        Left knee pain    Physical Exam Updated Vital Signs BP 130/63 (BP Location: Right Arm)   Pulse 91  Temp 98.2 F (36.8 C) (Oral)   Resp 18   Ht 5\' 2"  (1.575 m)   Wt 84.8 kg   LMP  (LMP Unknown)   SpO2 95%   BMI 34.19 kg/m  Physical Exam Vitals and nursing note reviewed.  Constitutional:      Appearance: Normal appearance.     Comments: Able to ambulate without difficulty  HENT:     Head: Atraumatic.  Cardiovascular:     Rate and Rhythm: Normal rate and regular rhythm.     Comments: 2+ dorsalis pedis pulse bilaterally Pulmonary:     Effort: Pulmonary effort is normal.  Musculoskeletal:     Comments: Right and Left lower extremity:  General Mild nonpitting edema of the bilateral lower extremities, symmetric  bilaterally.  No obvious deformity. No erythema, contusions.  Small scabbed over abrasion of the left first toe, no surrounding erythema or edema  Palpation Tender in the popliteal fossa of the left knee.  Non tender over the femur, tibia or fibula bilaterally.  Nontender of the ankle or feet diffusely.  ROM Full knee flexion and extension of right knee, decreased range of motion of the left knee due to pain  Special tests No ligamentous laxity with anterior drawer testing or posterior drawer testing   Sensation: Sensation intact throughout the lower extremity  Strength: 5/5 strength with resisted knee flexion and extension  5/5 strength with resisted ankle plantarflexion and dorsiflexion   Neurological:     General: No focal deficit present.     Mental Status: She is alert.  Psychiatric:        Mood and Affect: Mood normal.        Behavior: Behavior normal.     ED Results / Procedures / Treatments   Labs (all labs ordered are listed, but only abnormal results are displayed) Labs Reviewed - No data to display  EKG None  Radiology VAS [place] LOWER EXTREMITY VENOUS (DVT) (7a-7p) Result Date: 12/01/2023  Lower Venous DVT Study Patient Name:  SHIRLENE ANDAYA [facility]  Date of Exam:   12/01/2023 Medical Rec #: [id number]              Accession #:    [id number] Date of Birth: 1941-09-01              Patient Gender: F Patient Age:   15 years Exam Location:  [facility] Procedure:      VAS [place] LOWER EXTREMITY VENOUS (DVT) Referring Phys: Cathren Laine --------------------------------------------------------------------------------  Indications: Pain, and Swelling.  Comparison Study: Previous LLEV exam on 02/04/2021 was negative for DVT Performing Technologist: Ernestene Mention RVT, RDMS  Examination Guidelines: A complete evaluation includes B-mode imaging, spectral Doppler, color Doppler, and power Doppler as needed of all accessible portions of each vessel. Bilateral testing is considered an  integral part of a complete examination. Limited examinations for reoccurring indications may be performed as noted. The reflux portion of the exam is performed with the patient in reverse Trendelenburg.  +-----+---------------+---------+-----------+----------+--------------+ RIGHTCompressibilityPhasicitySpontaneityPropertiesThrombus Aging +-----+---------------+---------+-----------+----------+--------------+ CFV  Full           Yes      Yes                                 +-----+---------------+---------+-----------+----------+--------------+   +---------+---------------+---------+-----------+----------+--------------+ LEFT     CompressibilityPhasicitySpontaneityPropertiesThrombus Aging +---------+---------------+---------+-----------+----------+--------------+ CFV      Full           Yes  Yes                                 +---------+---------------+---------+-----------+----------+--------------+ SFJ      Full                                                        +---------+---------------+---------+-----------+----------+--------------+ FV Prox  Full           Yes      Yes                                 +---------+---------------+---------+-----------+----------+--------------+ FV Mid   Full           Yes      Yes                                 +---------+---------------+---------+-----------+----------+--------------+ FV DistalFull           Yes      Yes                                 +---------+---------------+---------+-----------+----------+--------------+ PFV      Full                                                        +---------+---------------+---------+-----------+----------+--------------+ POP      Full           Yes      Yes                                 +---------+---------------+---------+-----------+----------+--------------+ PTV      Full                                                         +---------+---------------+---------+-----------+----------+--------------+ PERO     Full                                                        +---------+---------------+---------+-----------+----------+--------------+     Summary: RIGHT: - No evidence of common femoral vein obstruction.   LEFT: - There is no evidence of deep vein thrombosis in the lower extremity.  - A heterogenous cystic structure is found in the popliteal fossa (4.12 x 1.53 x 3.10 cm).  *See table(s) above for measurements and observations.    Preliminary    DG Ankle Complete Left Result Date: 12/01/2023 CLINICAL DATA:  Pain and swelling EXAM: LEFT ANKLE COMPLETE - 3 VIEW COMPARISON:  None Available. FINDINGS: Global soft tissue swelling about the ankle. There  is lucency with cortical irregularity along the anterior aspect of the calcaneus on the lateral view. There is also some irregularity along the subtalar joint. Osteopenia. No dislocation or fracture. IMPRESSION: Global soft tissue swelling the ankle with erosive appearance to the anterior aspect the calcaneus on lateral view. An aggressive lesion is possible. Please correlate for any clinical signs of infection. Otherwise further workup is recommended with MRI or bone scan to further delineate. Electronically Signed   By: Karen Kays M.D.   On: 12/01/2023 11:40    Procedures Procedures    Medications Ordered in ED Medications  oxyCODONE-acetaminophen (PERCOCET/ROXICET) 5-325 MG per tablet 1 tablet (has no administration in time range)  ibuprofen (ADVIL) tablet 600 mg (has no administration in time range)    ED Course/ Medical Decision Making/ A&P                                 Medical Decision Making Risk OTC drugs. Prescription drug management.     Differential diagnosis includes but is not limited to DVT, fracture, dislocation, ligamentous injury, cellulitis, Baker's cyst, osteoarthritis  ED Course:  Mild edema of the left knee with tenderness  palpation of the popliteal fossa.  Patient able to flex and extend left knee, but decreased range of motion compared to the right due to pain.  Patient denies any injuries to the knee, any falls, no point tenderness.  No indication for further imaging at this time.  I did review x-ray of the left knee from December 2024 which showed severe tricompartmental osteoarthritis, this could be contributing to her pain.  I reviewed workup from ER visit yesterday included a ultrasound of the left lower extremity which was negative for DVT. She is anticoagulated and do not feel this needs to be repeated at this time, low concern for DVT.  Ultrasound did reveal a cystic structure in the left popliteal fossa where patient's pain is.  I suspect she has a Baker's cyst which is the cause of her pain.  She does not have any overlying wounds that would make me concerned for infectious etiology such as osteomyelitis or joint space infection at this time. Does have a small abrasion to left great toe, will continue course of Keflex prescribed by ER provider yesterday although it does not appear infected at this time. Patient given ibuprofen and Percocet here for pain  Patient stable and appropriate for discharge home this time   Addendum 12:00pm spoke to patient's daughter's who reports concern for patient having more difficulty ambulating at home.  Will provide walker.   Impression: Baker's cyst left popliteal fossa  Disposition:  The patient was discharged home with instructions to take Tylenol and ibuprofen as needed for pain.  Follow-up with orthopedics if symptoms not improving within the next week. Continue to take course of Keflex as prescribed by ER provider yesterday. Return precautions given.  External records from outside source obtained and reviewed including ER visit 12/01/23 including labs and ultrasound imaging              Final Clinical Impression(s) / ED Diagnoses Final diagnoses:   Baker's cyst, left    Rx / DC Orders ED Discharge Orders     None         Arabella Merles, PA-C 12/02/23 1118    Arabella Merles, PA-C 12/02/23 1200    Virgina Norfolk, DO 12/02/23 1349

## 2023-12-02 NOTE — ED Triage Notes (Signed)
Patient brought in by EMS due to left knee swelling and pain that radiates down her left leg. Per EMS report, patient was seen at [facility] yesterday and diagnosed with a cyst in the leg and was prescribed antibiotics and pain meds for home. Only took one dose of both at home. Pt reports pain and swelling is not any better.

## 2023-12-02 NOTE — Discharge Instructions (Addendum)
You were prescribed an antibiotic yesterday called Keflex.  Please take 1 pill every 6 hours until you finish the full course of antibiotic.  You may take up to 1000mg  of tylenol every 6 hours as needed for pain.  Do not take more then 4g per day.  You may use up to 600mg  ibuprofen every 6 hours as needed for pain.  Do not exceed 2.4g of ibuprofen per day.  You were given your first dose here today.  You may use compression socks with your swelling in the legs.  Please follow-up with the orthopedic office listed below if symptoms do not start to improve within the next week.  Please return the ER for any unexplained fevers, severe increase in pain, numbness or tingling in the toes, any other new or concerning symptoms.

## 2023-12-04 NOTE — Anesthesia Preprocedure Evaluation (Signed)
Anesthesia Evaluation  Patient identified by MRN, date of birth, ID band Patient awake    Reviewed: Allergy & Precautions, NPO status , Patient's Chart, lab work & pertinent test results  Airway Mallampati: II  TM Distance: >3 FB Neck ROM: Full    Dental  (+) Dental Advisory Given, Caps   Pulmonary    breath sounds clear to auscultation       Cardiovascular hypertension, Pt. on medications and Pt. on home beta blockers + dysrhythmias Atrial Fibrillation  Rhythm:Regular Rate:Normal  '16 ECHO:  - Left ventricle: The cavity size was normal. Wall thickness was    normal. Systolic function was normal. The estimated ejection    fraction was in the range of 55% to 60%. Wall motion was normal;    there were no regional wall motion abnormalities.  - Aortic valve: There was trivial regurgitation.  - Mitral valve: Mildly to moderately calcified annulus. There was    mild to moderate regurgitation.  - Left atrium: The atrium was moderately dilated.  - Right atrium: The atrium was mildly dilated.     Neuro/Psych    GI/Hepatic negative GI ROS, Neg liver ROS,,,  Endo/Other  BMI 33.8  Renal/GU negative Renal ROS     Musculoskeletal   Abdominal   Peds  Hematology Eliquislast dose 12/02/2023 am   Anesthesia Other Findings   Reproductive/Obstetrics                             Anesthesia Physical Anesthesia Plan  ASA: 3  Anesthesia Plan: Spinal   Post-op Pain Management: Regional block* and Tylenol PO (pre-op)*   Induction:   PONV Risk Score and Plan: 2 and Ondansetron and Treatment may vary due to age or medical condition  Airway Management Planned: Natural Airway and Simple Face Mask  Additional Equipment: None  Intra-op Plan:   Post-operative Plan:   Informed Consent: I have reviewed the patients History and Physical, chart, labs and discussed the procedure including the risks, benefits and  alternatives for the proposed anesthesia with the patient or authorized representative who has indicated his/her understanding and acceptance.     Dental advisory given  Plan Discussed with: CRNA and Surgeon  Anesthesia Plan Comments: (Plan routine monitors, SAB with adductor canal block for post op analgesia  PAT note by Antionette Poles, PA-C: 83 year old female follows with cardiology for history of permanent A-fib on Eliquis, HTN, mild to moderate mitral valve regurgitation.  Seen by Jari Favre, PA-C 11/07/2023 for preop evaluation.  Per note, "Ms. Anton's perioperative risk of a major cardiac event is 0.9% according to the Revised Cardiac Risk Index (RCRI).  Therefore, she is at low risk for perioperative complications.   Her functional capacity is fair at 4.61 METs according to the Duke Activity Status Index (DASI). Recommendations: According to ACC/AHA guidelines, no further cardiovascular testing needed.  The patient may proceed to surgery at acceptable risk. Antiplatelet and/or Anticoagulation Recommendations: Eliquis (Apixaban) can be held for 3 days prior to surgery.  Please resume post op when felt to be safe."  Patient seen in the ED on 12/01/2023 and 12/02/2023 with complaint of left knee pain.  Workup 12/01/2023 was essentially benign, ultrasound negative for DVT, she was discharged with a course of Keflex for abrasion on her left great toe.  She presented back to the ED on 12/02/2023 with continued complaints of left knee pain.  Evaluation was again benign, pain felt secondary to tricompartmental osteoarthritis  as well as Baker's cyst.  She was discharged with instructions to take Tylenol and ibuprofen as needed for pain and follow-up with orthopedics.  I did reach out to Dr. Warren Danes office to make sure that he was aware of these evaluations.  History of OSA, not on CPAP.  Patient reports last dose Eliquis 12/02/2023.  CMP and CBC 12/01/2023 reviewed, unremarkable.  EKG 12/06/2022: Atrial  fibrillation.  Rate 108.  Borderline repolarization abnormality.  TTE 12/15/2014: - Left ventricle: The cavity size was normal. Wall thickness was  normal. Systolic function was normal. The estimated ejection  fraction was in the range of 55% to 60%. Wall motion was normal;  there were no regional wall motion abnormalities.  - Aortic valve: There was trivial regurgitation.  - Mitral valve: Mildly to moderately calcified annulus. There was  mild to moderate regurgitation.  - Left atrium: The atrium was moderately dilated.  - Right atrium: The atrium was mildly dilated.   )        Anesthesia Quick Evaluation

## 2023-12-05 ENCOUNTER — Ambulatory Visit (INDEPENDENT_AMBULATORY_CARE_PROVIDER_SITE_OTHER): Payer: Medicare Other | Admitting: Orthopaedic Surgery

## 2023-12-05 DIAGNOSIS — R609 Edema, unspecified: Secondary | ICD-10-CM | POA: Insufficient documentation

## 2023-12-05 MED ORDER — TRANEXAMIC ACID 1000 MG/10ML IV SOLN
2000.0000 mg | INTRAVENOUS | Status: AC
  Filled 2023-12-05 (×2): qty 20

## 2023-12-05 NOTE — Progress Notes (Signed)
Office Visit Note   Patient: Chelsea [name]           Date of Birth: [birth year]           MRN: [id number] Visit Date: 12/05/2023              Requested by: Medicine, [facility] [address] [place],  [place] [address] PCP: Medicine, [facility]   Assessment & Plan: Visit Diagnoses:  1. Pitting edema     Plan: To me the left leg does not look infected looks like the pitting edema is just worse on that side.  She may be experiencing a rupturing Baker's cyst.  She has CHF.  I am fine with proceeding with total knee replacement tomorrow on the right leg.  All of this was explained with the patient and to her grandson and daughter over the phone.  She has already stopped her Eliquis in preparation for the surgery.  Follow-Up Instructions: No follow-ups on file.   Orders:  No orders of the defined types were placed in this encounter.  No orders of the defined types were placed in this encounter.     Procedures: No procedures performed   Clinical Data: No additional findings.   Subjective: Chief Complaint  Patient presents with   Right Knee - Pain    Scheduled 12/05/23 RT TKA    HPI Patient is a 83 year old female who comes in for clinical check of her left leg.  She is scheduled for right total knee replacement tomorrow.  Recently went to the ER twice for left knee pain and swelling.  Was prophylactically placed on 5 days of Keflex by the ER.  Had an ultrasound that showed a Baker's cyst but no DVT.  Denies any constitutional symptoms. Review of Systems  Constitutional: Negative.   HENT: Negative.    Eyes: Negative.   Respiratory: Negative.    Cardiovascular: Negative.   Endocrine: Negative.   Musculoskeletal: Negative.   Neurological: Negative.   Hematological: Negative.   Psychiatric/Behavioral: Negative.    All other systems reviewed and are negative.    Objective: Vital Signs: LMP  (LMP Unknown)    Physical Exam Vitals and nursing note reviewed.  Constitutional:      Appearance: She is well-developed.  HENT:     Head: Normocephalic and atraumatic.  Pulmonary:     Effort: Pulmonary effort is normal.  Abdominal:     Palpations: Abdomen is soft.  Musculoskeletal:     Cervical back: Neck supple.  Skin:    General: Skin is warm.     Capillary Refill: Capillary refill takes less than 2 seconds.  Neurological:     Mental Status: She is alert and oriented to person, place, and time.  Psychiatric:        Behavior: Behavior normal.        Thought Content: Thought content normal.        Judgment: Judgment normal.     Ortho Exam Examination of the left lower leg shows slight erythema that is not cellulitic.  This appears to be the same on the other leg.  She has pitting edema that is chronic.  No neurovascular compromise. Specialty Comments:  No specialty comments available.  Imaging: No results found.   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  Primary osteoarthritis of right knee 12/03/2020   Chronic anticoagulation 03/06/2015   Edema-dopplers negative for DVT 03/06/2015   CHF exacerbation (HCC)    SOB (shortness of breath)    Chest pain    Acute on chronic diastolic congestive heart failure (HCC)    Atrial fibrillation with RVR (HCC) 03/04/2015   Essential hypertension 02/24/2015   Chronic diastolic heart failure (HCC) 12/10/2014   Permanent atrial fibrillation (HCC) 12/10/2014   Paroxysmal nocturnal dyspnea 12/10/2014   Past Medical History:  Diagnosis Date   Arthritis    Atrial fibrillation (HCC)    Atrial fibrillation with RVR (HCC) 03/04/2015   CHF (congestive heart failure) (HCC)    Hypertension    Sleep apnea    does not use cpap    Family History  Problem Relation Age of Onset   Heart failure Father      Past Surgical History:  Procedure Laterality Date   NO PAST SURGERIES     Social History   Occupational History   Not on file  Tobacco Use   Smoking status: Never   Smokeless tobacco: Never  Vaping Use   Vaping status: Never Used  Substance and Sexual Activity   Alcohol use: No   Drug use: No   Sexual activity: Never

## 2023-12-05 NOTE — Progress Notes (Signed)
Pt and daughter [name] made aware of surgery time change for 12/06/23 1220-1427, arrival 0950, stop eating food by midnight, finish pre-surgery ensure drink by 0920, and to follow all previous instructions given.   Pt seemed to have a little trouble understanding some of the instructions that were being relayed. Suggested having an interpreter present so that she will be able to understand everything that is happening. Pt and daughter agreed. The language is Arabic.

## 2023-12-06 ENCOUNTER — Observation Stay (HOSPITAL_COMMUNITY)
Admission: RE | Admit: 2023-12-06 | Discharge: 2023-12-08 | Disposition: A | Discharge status: Home / Self Care | Payer: Medicare Other | Attending: Orthopaedic Surgery | Admitting: Orthopaedic Surgery

## 2023-12-06 ENCOUNTER — Ambulatory Visit (HOSPITAL_COMMUNITY): Payer: Self-pay | Admitting: Physician Assistant

## 2023-12-06 ENCOUNTER — Observation Stay (HOSPITAL_COMMUNITY): Payer: Medicare Other

## 2023-12-06 ENCOUNTER — Encounter (HOSPITAL_COMMUNITY): Payer: Self-pay | Admitting: Orthopaedic Surgery

## 2023-12-06 ENCOUNTER — Other Ambulatory Visit: Payer: Self-pay

## 2023-12-06 ENCOUNTER — Encounter (HOSPITAL_COMMUNITY)
Admission: RE | Disposition: A | Discharge status: Home / Self Care | Payer: Self-pay | Source: Home / Self Care | Attending: Orthopaedic Surgery

## 2023-12-06 ENCOUNTER — Ambulatory Visit (HOSPITAL_BASED_OUTPATIENT_CLINIC_OR_DEPARTMENT_OTHER): Payer: Self-pay | Admitting: Anesthesiology

## 2023-12-06 DIAGNOSIS — I509 Heart failure, unspecified: Secondary | ICD-10-CM | POA: Diagnosis not present

## 2023-12-06 DIAGNOSIS — Z79899 Other long term (current) drug therapy: Secondary | ICD-10-CM | POA: Diagnosis not present

## 2023-12-06 DIAGNOSIS — Z96651 Presence of right artificial knee joint: Secondary | ICD-10-CM

## 2023-12-06 DIAGNOSIS — I4891 Unspecified atrial fibrillation: Secondary | ICD-10-CM | POA: Insufficient documentation

## 2023-12-06 DIAGNOSIS — Z7901 Long term (current) use of anticoagulants: Secondary | ICD-10-CM | POA: Insufficient documentation

## 2023-12-06 DIAGNOSIS — I110 Hypertensive heart disease with heart failure: Secondary | ICD-10-CM | POA: Diagnosis not present

## 2023-12-06 DIAGNOSIS — M1711 Unilateral primary osteoarthritis, right knee: Secondary | ICD-10-CM

## 2023-12-06 DIAGNOSIS — I5032 Chronic diastolic (congestive) heart failure: Secondary | ICD-10-CM | POA: Diagnosis not present

## 2023-12-06 HISTORY — PX: TOTAL KNEE ARTHROPLASTY: SHX125

## 2023-12-06 SURGERY — ARTHROPLASTY, KNEE, TOTAL
Anesthesia: Spinal | Site: Knee | Laterality: Right

## 2023-12-06 MED ORDER — FENTANYL CITRATE (PF) 100 MCG/2ML IJ SOLN
INTRAMUSCULAR | Status: AC
  Administered 2023-12-06: 50 ug via INTRAVENOUS
  Filled 2023-12-06: qty 2

## 2023-12-06 MED ORDER — OXYCODONE HCL 5 MG PO TABS
5.0000 mg | ORAL_TABLET | ORAL | Status: DC | PRN
  Administered 2023-12-06 – 2023-12-08 (×5): 10 mg via ORAL
  Filled 2023-12-06 (×6): qty 2

## 2023-12-06 MED ORDER — MIDAZOLAM HCL 2 MG/2ML IJ SOLN: 0.5000 mg | Freq: Once | INTRAMUSCULAR | Status: DC | PRN

## 2023-12-06 MED ORDER — HYDROMORPHONE HCL 1 MG/ML IJ SOLN: 0.5000 mg | INTRAMUSCULAR | Status: DC | PRN

## 2023-12-06 MED ORDER — OXYCODONE HCL 5 MG PO TABS: 5.0000 mg | ORAL_TABLET | Freq: Once | ORAL | Status: DC | PRN

## 2023-12-06 MED ORDER — APIXABAN 2.5 MG PO TABS: 2.5000 mg | ORAL_TABLET | Freq: Two times a day (BID) | ORAL | Status: DC

## 2023-12-06 MED ORDER — ONDANSETRON HCL 4 MG/2ML IJ SOLN
INTRAMUSCULAR | Status: DC | PRN
  Administered 2023-12-06: 4 mg via INTRAVENOUS

## 2023-12-06 MED ORDER — PHENYLEPHRINE 80 MCG/ML (10ML) SYRINGE FOR IV PUSH (FOR BLOOD PRESSURE SUPPORT)
PREFILLED_SYRINGE | INTRAVENOUS | Status: DC | PRN
  Administered 2023-12-06: 160 ug via INTRAVENOUS
  Administered 2023-12-06: 80 ug via INTRAVENOUS

## 2023-12-06 MED ORDER — METOCLOPRAMIDE HCL 5 MG/ML IJ SOLN: 5.0000 mg | Freq: Three times a day (TID) | INTRAMUSCULAR | Status: DC | PRN

## 2023-12-06 MED ORDER — METOPROLOL SUCCINATE ER 50 MG PO TB24
50.0000 mg | ORAL_TABLET | Freq: Two times a day (BID) | ORAL | Status: DC
  Administered 2023-12-06 – 2023-12-08 (×4): 50 mg via ORAL
  Filled 2023-12-06 (×4): qty 1

## 2023-12-06 MED ORDER — DILTIAZEM HCL ER COATED BEADS 120 MG PO CP24
120.0000 mg | ORAL_CAPSULE | Freq: Every day | ORAL | Status: DC
  Administered 2023-12-07 – 2023-12-08 (×2): 120 mg via ORAL
  Filled 2023-12-06 (×2): qty 1

## 2023-12-06 MED ORDER — VANCOMYCIN HCL 1000 MG IV SOLR
INTRAVENOUS | Status: DC | PRN
  Administered 2023-12-06: 1000 mg

## 2023-12-06 MED ORDER — LACTATED RINGERS IV SOLN: INTRAVENOUS | Status: DC

## 2023-12-06 MED ORDER — KETOROLAC TROMETHAMINE 15 MG/ML IJ SOLN
7.5000 mg | Freq: Four times a day (QID) | INTRAMUSCULAR | Status: AC
  Administered 2023-12-06 – 2023-12-07 (×4): 7.5 mg via INTRAVENOUS
  Filled 2023-12-06 (×4): qty 1

## 2023-12-06 MED ORDER — ROPIVACAINE HCL 7.5 MG/ML IJ SOLN
INTRAMUSCULAR | Status: DC | PRN
  Administered 2023-12-06: 20 mL via PERINEURAL

## 2023-12-06 MED ORDER — PHENYLEPHRINE 80 MCG/ML (10ML) SYRINGE FOR IV PUSH (FOR BLOOD PRESSURE SUPPORT)
PREFILLED_SYRINGE | INTRAVENOUS | Status: AC
  Filled 2023-12-06: qty 60

## 2023-12-06 MED ORDER — VANCOMYCIN HCL 1000 MG IV SOLR
INTRAVENOUS | Status: AC
  Filled 2023-12-06: qty 20

## 2023-12-06 MED ORDER — ONDANSETRON HCL 4 MG/2ML IJ SOLN
4.0000 mg | Freq: Four times a day (QID) | INTRAMUSCULAR | Status: DC | PRN
Start: 2023-12-06 — End: 2023-12-08

## 2023-12-06 MED ORDER — DEXAMETHASONE SODIUM PHOSPHATE 10 MG/ML IJ SOLN
INTRAMUSCULAR | Status: DC | PRN
  Administered 2023-12-06: 8 mg via INTRAVENOUS
  Administered 2023-12-06: 4 mg via INTRAVENOUS

## 2023-12-06 MED ORDER — SODIUM CHLORIDE 0.9 % IR SOLN
Status: DC | PRN
  Administered 2023-12-06: 1000 mL

## 2023-12-06 MED ORDER — OXYCODONE HCL 5 MG PO TABS
10.0000 mg | ORAL_TABLET | ORAL | Status: DC | PRN
  Administered 2023-12-07: 10 mg via ORAL

## 2023-12-06 MED ORDER — TRANEXAMIC ACID-NACL 1000-0.7 MG/100ML-% IV SOLN
1000.0000 mg | Freq: Once | INTRAVENOUS | Status: AC
  Administered 2023-12-06: 1000 mg via INTRAVENOUS
  Filled 2023-12-06: qty 100

## 2023-12-06 MED ORDER — FUROSEMIDE 40 MG PO TABS
40.0000 mg | ORAL_TABLET | Freq: Every day | ORAL | Status: DC
  Administered 2023-12-06 – 2023-12-08 (×3): 40 mg via ORAL
  Filled 2023-12-06 (×3): qty 1

## 2023-12-06 MED ORDER — CEFAZOLIN SODIUM-DEXTROSE 2-4 GM/100ML-% IV SOLN
2.0000 g | Freq: Four times a day (QID) | INTRAVENOUS | Status: AC
  Administered 2023-12-06 (×2): 2 g via INTRAVENOUS
  Filled 2023-12-06 (×2): qty 100

## 2023-12-06 MED ORDER — METOCLOPRAMIDE HCL 5 MG PO TABS: 5.0000 mg | ORAL_TABLET | Freq: Three times a day (TID) | ORAL | Status: DC | PRN

## 2023-12-06 MED ORDER — FENTANYL CITRATE (PF) 100 MCG/2ML IJ SOLN: 50.0000 ug | Freq: Once | INTRAMUSCULAR | Status: AC

## 2023-12-06 MED ORDER — DEXAMETHASONE SODIUM PHOSPHATE 10 MG/ML IJ SOLN
10.0000 mg | Freq: Once | INTRAMUSCULAR | Status: AC
  Administered 2023-12-07: 10 mg via INTRAVENOUS
  Filled 2023-12-06: qty 1

## 2023-12-06 MED ORDER — ACETAMINOPHEN 500 MG PO TABS
1000.0000 mg | ORAL_TABLET | Freq: Once | ORAL | Status: AC
  Administered 2023-12-06: 1000 mg via ORAL
  Filled 2023-12-06: qty 2

## 2023-12-06 MED ORDER — TRANEXAMIC ACID 1000 MG/10ML IV SOLN
INTRAVENOUS | Status: DC | PRN
  Administered 2023-12-06: 2000 mg via TOPICAL

## 2023-12-06 MED ORDER — DOCUSATE SODIUM 100 MG PO CAPS
100.0000 mg | ORAL_CAPSULE | Freq: Two times a day (BID) | ORAL | Status: DC
  Administered 2023-12-06 – 2023-12-08 (×4): 100 mg via ORAL
  Filled 2023-12-06 (×4): qty 1

## 2023-12-06 MED ORDER — PRONTOSAN WOUND IRRIGATION OPTIME
TOPICAL | Status: DC | PRN
  Administered 2023-12-06: 350 mL via TOPICAL

## 2023-12-06 MED ORDER — PROPOFOL 500 MG/50ML IV EMUL
INTRAVENOUS | Status: DC | PRN
  Administered 2023-12-06: 50 ug/kg/min via INTRAVENOUS

## 2023-12-06 MED ORDER — HYDROMORPHONE HCL 1 MG/ML IJ SOLN: 0.2500 mg | INTRAMUSCULAR | Status: DC | PRN

## 2023-12-06 MED ORDER — MENTHOL 3 MG MT LOZG: 1.0000 | LOZENGE | OROMUCOSAL | Status: DC | PRN

## 2023-12-06 MED ORDER — PHENOL 1.4 % MT LIQD: 1.0000 | OROMUCOSAL | Status: DC | PRN

## 2023-12-06 MED ORDER — BUPIVACAINE-MELOXICAM ER 400-12 MG/14ML IJ SOLN
INTRAMUSCULAR | Status: AC
  Filled 2023-12-06: qty 1

## 2023-12-06 MED ORDER — MIDAZOLAM HCL 2 MG/2ML IJ SOLN: 1.0000 mg | Freq: Once | INTRAMUSCULAR | Status: AC

## 2023-12-06 MED ORDER — ACETAMINOPHEN 500 MG PO TABS
1000.0000 mg | ORAL_TABLET | Freq: Four times a day (QID) | ORAL | Status: AC
  Administered 2023-12-06 – 2023-12-07 (×3): 1000 mg via ORAL
  Filled 2023-12-06 (×4): qty 2

## 2023-12-06 MED ORDER — ONDANSETRON HCL 4 MG PO TABS
4.0000 mg | ORAL_TABLET | Freq: Four times a day (QID) | ORAL | Status: DC | PRN
Start: 2023-12-06 — End: 2023-12-08

## 2023-12-06 MED ORDER — METHOCARBAMOL 500 MG PO TABS
500.0000 mg | ORAL_TABLET | Freq: Four times a day (QID) | ORAL | Status: DC | PRN
  Administered 2023-12-06 – 2023-12-07 (×3): 500 mg via ORAL
  Filled 2023-12-06 (×3): qty 1

## 2023-12-06 MED ORDER — OXYCODONE HCL 5 MG/5ML PO SOLN: 5.0000 mg | Freq: Once | ORAL | Status: DC | PRN

## 2023-12-06 MED ORDER — CHLORHEXIDINE GLUCONATE 0.12 % MT SOLN
15.0000 mL | Freq: Once | OROMUCOSAL | Status: AC
Start: 2023-12-06 — End: 2023-12-06
  Administered 2023-12-06: 15 mL via OROMUCOSAL
  Filled 2023-12-06: qty 15

## 2023-12-06 MED ORDER — POVIDONE-IODINE 10 % EX SWAB
2.0000 | Freq: Once | CUTANEOUS | Status: AC
  Administered 2023-12-06: 2 via TOPICAL

## 2023-12-06 MED ORDER — DEXAMETHASONE SODIUM PHOSPHATE 10 MG/ML IJ SOLN
INTRAMUSCULAR | Status: AC
  Filled 2023-12-06: qty 1

## 2023-12-06 MED ORDER — PROPOFOL 10 MG/ML IV BOLUS
INTRAVENOUS | Status: AC
  Filled 2023-12-06: qty 20

## 2023-12-06 MED ORDER — 0.9 % SODIUM CHLORIDE (POUR BTL) OPTIME
TOPICAL | Status: DC | PRN
  Administered 2023-12-06: 1000 mL

## 2023-12-06 MED ORDER — CEFAZOLIN SODIUM-DEXTROSE 2-4 GM/100ML-% IV SOLN
2.0000 g | INTRAVENOUS | Status: AC
  Administered 2023-12-06: 2 g via INTRAVENOUS
  Filled 2023-12-06: qty 100

## 2023-12-06 MED ORDER — ORAL CARE MOUTH RINSE: 15.0000 mL | Freq: Once | OROMUCOSAL | Status: AC

## 2023-12-06 MED ORDER — BUPIVACAINE IN DEXTROSE 0.75-8.25 % IT SOLN
INTRATHECAL | Status: DC | PRN
  Administered 2023-12-06: 12 mg via INTRATHECAL

## 2023-12-06 MED ORDER — PHENYLEPHRINE HCL-NACL 20-0.9 MG/250ML-% IV SOLN
INTRAVENOUS | Status: DC | PRN
  Administered 2023-12-06: 35 ug/min via INTRAVENOUS

## 2023-12-06 MED ORDER — METHOCARBAMOL 1000 MG/10ML IJ SOLN: 500.0000 mg | Freq: Four times a day (QID) | INTRAMUSCULAR | Status: DC | PRN

## 2023-12-06 MED ORDER — BUPIVACAINE-MELOXICAM ER 400-12 MG/14ML IJ SOLN
INTRAMUSCULAR | Status: DC | PRN
  Administered 2023-12-06: 400 mg

## 2023-12-06 MED ORDER — MIDAZOLAM HCL 2 MG/2ML IJ SOLN
INTRAMUSCULAR | Status: AC
  Administered 2023-12-06: 1 mg via INTRAVENOUS
  Filled 2023-12-06: qty 2

## 2023-12-06 MED ORDER — TRANEXAMIC ACID-NACL 1000-0.7 MG/100ML-% IV SOLN
1000.0000 mg | INTRAVENOUS | Status: AC
  Administered 2023-12-06: 1000 mg via INTRAVENOUS
  Filled 2023-12-06: qty 100

## 2023-12-06 MED ORDER — MEPERIDINE HCL 25 MG/ML IJ SOLN: 6.2500 mg | INTRAMUSCULAR | Status: DC | PRN

## 2023-12-06 MED ORDER — ACETAMINOPHEN 325 MG PO TABS: 325.0000 mg | ORAL_TABLET | Freq: Four times a day (QID) | ORAL | Status: DC | PRN

## 2023-12-06 SURGICAL SUPPLY — 78 items
ALCOHOL 70% 16 OZ (MISCELLANEOUS) ×1 IMPLANT
BAG COUNTER SPONGE SURGICOUNT (BAG) IMPLANT
BAG DECANTER FOR FLEXI CONT (MISCELLANEOUS) ×1 IMPLANT
BANDAGE ESMARK 6X9 LF (GAUZE/BANDAGES/DRESSINGS) IMPLANT
BLADE SAG 18X100X1.27 (BLADE) ×1 IMPLANT
BLADE SAW SAG 90X13X1.27 (BLADE) IMPLANT
BLADE SAW SGTL 73X25 THK (BLADE) ×1 IMPLANT
BNDG ESMARK 6X9 LF (GAUZE/BANDAGES/DRESSINGS) IMPLANT
BOWL SMART MIX CTS (DISPOSABLE) ×1 IMPLANT
CEMENT BONE REFOBACIN R1X40 US (Cement) IMPLANT
CLSR STERI-STRIP ANTIMIC 1/2X4 (GAUZE/BANDAGES/DRESSINGS) ×2 IMPLANT
COOLER ICEMAN CLASSIC (MISCELLANEOUS) ×1 IMPLANT
COVER SURGICAL LIGHT HANDLE (MISCELLANEOUS) ×1 IMPLANT
CUFF TOURN SGL QUICK 42 (TOURNIQUET CUFF) IMPLANT
CUFF TRNQT CYL 34X4.125X (TOURNIQUET CUFF) ×1 IMPLANT
DERMABOND ADVANCED .7 DNX12 (GAUZE/BANDAGES/DRESSINGS) ×1 IMPLANT
DRAPE EXTREMITY T 121X128X90 (DISPOSABLE) ×1 IMPLANT
DRAPE HALF SHEET 40X57 (DRAPES) ×1 IMPLANT
DRAPE INCISE IOBAN 66X45 STRL (DRAPES) ×1 IMPLANT
DRAPE POUCH INSTRU U-SHP 10X18 (DRAPES) ×1 IMPLANT
DRAPE SURG ORHT 6 SPLT 77X108 (DRAPES) IMPLANT
DRAPE U-SHAPE 47X51 STRL (DRAPES) ×2 IMPLANT
DRSG AQUACEL AG ADV 3.5X10 (GAUZE/BANDAGES/DRESSINGS) ×1 IMPLANT
DURAPREP 26ML APPLICATOR (WOUND CARE) ×3 IMPLANT
ELECT CAUTERY BLADE 6.4 (BLADE) ×1 IMPLANT
ELECT PENCIL ROCKER SW 15FT (MISCELLANEOUS) ×1 IMPLANT
ELECT REM PT RETURN 9FT ADLT (ELECTROSURGICAL) ×1 IMPLANT
ELECTRODE REM PT RTRN 9FT ADLT (ELECTROSURGICAL) ×1 IMPLANT
FEMUR CMT CR STD SZ 6 RT KNEE (Joint) ×1 IMPLANT
FEMUR CMTD CR STD SZ 6 RT KNEE (Joint) IMPLANT
GLOVE BIOGEL PI IND STRL 7.0 (GLOVE) ×2 IMPLANT
GLOVE BIOGEL PI IND STRL 7.5 (GLOVE) ×5 IMPLANT
GLOVE ECLIPSE 7.0 STRL STRAW (GLOVE) ×3 IMPLANT
GLOVE INDICATOR 7.0 STRL GRN (GLOVE) ×1 IMPLANT
GLOVE INDICATOR 7.5 STRL GRN (GLOVE) ×1 IMPLANT
GLOVE SURG SYN 7.5 E (GLOVE) ×2 IMPLANT
GLOVE SURG SYN 7.5 PF PI (GLOVE) ×2 IMPLANT
GLOVE SURG UNDER LTX SZ7.5 (GLOVE) ×2 IMPLANT
GLOVE SURG UNDER POLY LF SZ7 (GLOVE) ×2 IMPLANT
GOWN STRL REUS W/ TWL LRG LVL3 (GOWN DISPOSABLE) ×1 IMPLANT
GOWN STRL SURGICAL XL XLNG (GOWN DISPOSABLE) ×1 IMPLANT
GOWN TOGA ZIPPER T7+ PEEL AWAY (MISCELLANEOUS) ×2 IMPLANT
GUIDE TASP BOTTOM PS EF +0 RT (ORTHOPEDIC DISPOSABLE SUPPLIES) IMPLANT
HOOD PEEL AWAY T7 (MISCELLANEOUS) ×1 IMPLANT
INSERT TIB AS PERS SZ 6-7 14 (Insert) IMPLANT
KIT BASIN OR (CUSTOM PROCEDURE TRAY) ×1 IMPLANT
KIT TURNOVER KIT B (KITS) ×1 IMPLANT
MANIFOLD NEPTUNE II (INSTRUMENTS) ×1 IMPLANT
MARKER SKIN DUAL TIP RULER LAB (MISCELLANEOUS) ×2 IMPLANT
NDL SPNL 18GX3.5 QUINCKE PK (NEEDLE) ×1 IMPLANT
NEEDLE SPNL 18GX3.5 QUINCKE PK (NEEDLE) ×1 IMPLANT
NS IRRIG 1000ML POUR BTL (IV SOLUTION) ×1 IMPLANT
PACK TOTAL JOINT (CUSTOM PROCEDURE TRAY) ×1 IMPLANT
PAD ARMBOARD 7.5X6 YLW CONV (MISCELLANEOUS) ×2 IMPLANT
PAD COLD SHLDR WRAP-ON (PAD) ×1 IMPLANT
PIN DRILL HDLS TROCAR 75 4PK (PIN) IMPLANT
SCREW FEMALE HEX FIX 25X2.5 (ORTHOPEDIC DISPOSABLE SUPPLIES) IMPLANT
SET HNDPC FAN SPRY TIP SCT (DISPOSABLE) ×1 IMPLANT
SOLUTION PRONTOSAN WOUND 350ML (IRRIGATION / IRRIGATOR) ×1 IMPLANT
STAPLER VISISTAT 35W (STAPLE) IMPLANT
STEM POLY PAT PLY 29M KNEE (Knees) IMPLANT
STEM TIB ST PERS 14+30 (Stem) IMPLANT
STEM TIBIA 5 DEG SZ E R KNEE (Knees) IMPLANT
SUCTION TUBE FRAZIER 10FR DISP (SUCTIONS) ×1 IMPLANT
SUT ETHILON 2 0 FS 18 (SUTURE) IMPLANT
SUT MNCRL AB 3-0 PS2 18 (SUTURE) IMPLANT
SUT MNCRL AB 3-0 PS2 27 (SUTURE) IMPLANT
SUT VIC AB 0 CT1 27XBRD ANBCTR (SUTURE) ×2 IMPLANT
SUT VIC AB 1 CTX 27 (SUTURE) ×3 IMPLANT
SUT VIC AB 2-0 CT1 TAPERPNT 27 (SUTURE) ×4 IMPLANT
SYR 50ML LL SCALE MARK (SYRINGE) ×2 IMPLANT
TIBIA STEM 5 DEG SZ E R KNEE (Knees) ×1 IMPLANT
TOWEL GREEN STERILE (TOWEL DISPOSABLE) ×1 IMPLANT
TOWEL GREEN STERILE FF (TOWEL DISPOSABLE) ×1 IMPLANT
TRAY CATH INTERMITTENT SS 16FR (CATHETERS) IMPLANT
TUBE SUCT ARGYLE STRL (TUBING) ×1 IMPLANT
UNDERPAD 30X36 HEAVY ABSORB (UNDERPADS AND DIAPERS) ×1 IMPLANT
YANKAUER SUCT BULB TIP NO VENT (SUCTIONS) ×2 IMPLANT

## 2023-12-06 NOTE — Anesthesia Procedure Notes (Signed)
Spinal  Patient location during procedure: OR End time: 12/06/2023 1:36 PM Reason for block: surgical anesthesia Staffing Performed: anesthesiologist  Anesthesiologist: Jairo Ben, MD Performed by: Jairo Ben, MD Authorized by: Jairo Ben, MD   Preanesthetic Checklist Completed: patient identified, IV checked, site marked, risks and benefits discussed, surgical consent, monitors and equipment checked, pre-op evaluation and timeout performed Spinal Block Patient position: sitting Prep: DuraPrep Patient monitoring: heart rate, cardiac monitor, continuous pulse ox and blood pressure Approach: midline Location: L3-4 Injection technique: single-shot Needle Needle type: Pencan and Introducer  Needle gauge: 24 G Needle length: 9 cm Assessment Sensory level: T4 Events: CSF return Additional Notes Pt identified in Operating room.  Monitors applied. Working IV access confirmed. Timeout, Sterile prep, drape lumbar spine.  1% lido local L 3,4.  #24ga Pencan into clear CSF L 3,4.  12mg  0.75% Bupivacaine with dextrose injected with asp CSF beginning and end of injection.  Patient asymptomatic, VSS, no heme aspirated, tolerated well.  Sandford Craze, MD

## 2023-12-06 NOTE — Op Note (Signed)
Total Knee Arthroplasty Procedure Note  Preoperative diagnosis: Right knee osteoarthritis  Postoperative diagnosis:same  Operative findings: Complete loss joint space from all 3 compartments Varus deformity  Operative procedure: Right total knee arthroplasty. CPT [id number]  Surgeon: N. [name], MD  Assist: Oneal Grout, PA-C; necessary for the timely completion of procedure and due to complexity of procedure.  Anesthesia: Spinal, regional, local  Tourniquet time: see anesthesia record  Implants used: Zimmer persona Femur: CR 6 Tibia: E with 30 mm cemented stem Patella: 29 mm Polyethylene: 14 mm medial congruent  Indication: Chelsea [name] is a 83 y.o. year old female with a history of knee pain. Having failed conservative management, the patient elected to proceed with a total knee arthroplasty.  We have reviewed the risk and benefits of the surgery and they elected to proceed after voicing understanding.  Procedure:  After informed consent was obtained and understanding of the risk were voiced including but not limited to bleeding, infection, damage to surrounding structures including nerves and vessels, blood clots, leg length inequality and the failure to achieve desired results, the operative extremity was marked with verbal confirmation of the patient in the holding area.   The patient was then brought to the operating room and transported to the operating room table in the supine position.  A tourniquet was applied to the operative extremity around the upper thigh. The operative limb was then prepped and draped in the usual sterile fashion and preoperative antibiotics were administered.  A time out was performed prior to the start of surgery confirming the correct extremity, preoperative antibiotic administration, as well as team members, implants and instruments available for the case. Correct surgical site was also confirmed with preoperative  radiographs. The limb was then elevated for exsanguination and the tourniquet was inflated. A midline incision was made and a standard medial parapatellar approach was performed.  The infrapatellar fat pad was removed.  Suprapatellar synovium was removed to reveal the anterior distal femoral cortex.  A medial peel was performed to release the capsule and the deep MCL off of the medial tibial plateau back to the semimembranosus.  The patella was then everted which showed complete loss of articular cartilage and was resected to 13 mm and sized to a 29 mm.  A cover was placed on the patella for protection from retractors.  The knee was then brought into full flexion and we then turned our attention to the femur.  The ACL was sacrificed.  Start site was drilled in the femur and the intramedullary distal femoral cutting guide was placed, set at 3 degrees valgus, taking 10 mm of distal resection. The distal cut was made. Osteophytes were then removed.  Next, the proximal tibial cutting guide was placed with appropriate slope, varus/valgus alignment and depth of resection.  The drop rod was attached to confirm that it was aimed at the second metatarsal.  The proximal tibial cut was made taking 10 mm off the high lateral side. Gap blocks were then used to assess the extension gap and alignment, and appropriate soft tissue releases were performed. Attention was turned back to the femur, which was sized using the sizing guide to a size 6. Appropriate rotation of the femoral component was determined using epicondylar axis, Whiteside's line, and assessing the flexion gap under ligament tension. The appropriate size 4-in-1 cutting block was placed and checked with an angel wing and cuts were made. Posterior femoral osteophytes and uncapped bone were then removed with the curved  osteotome.  The menisci were removed.  Trial components were placed, and stability was checked in full extension, mid-flexion, and deep flexion.  PCL  was resected to balance the flexion space. Proper tibial rotation was determined and marked.  The patella tracked well without a lateral release.  The femoral lugs were then drilled. Trial components were then removed and tibial preparation performed.  The trial tibia was pointed to the medial third of the tibial tubercle.  The tibia was sized for a size E component.  Sclerotic bone was drilled to improve cement interdigitation. The bony surfaces were irrigated with a pulse lavage and then dried. Bone cement was vacuum mixed on the back table, and the final components sized above were cemented into place.  Antibiotic irrigation was placed in the knee joint and soft tissues while the cement cured.  After cement had finished curing, excess cement was removed. The stability of the construct was re-evaluated throughout a range of motion and found to be acceptable. The trial liner was removed, the knee was copiously irrigated, and the knee was re-evaluated for any excess bone debris. The real polyethylene liner, 14 mm thick, was inserted and checked to ensure the locking mechanism had engaged appropriately. The tourniquet was deflated and hemostasis was achieved. The wound was irrigated with normal saline.  One gram of vancomycin powder was placed in the surgical bed.  Topical mixture of 0.25% bupivacaine and meloxicam was placed in the joint for postoperative pain.  Capsular closure was performed with a #1 vicryl in flexion, subcutaneous fat closed with a 0 vicryl suture, then subcutaneous tissue closed with interrupted 2.0 vicryl suture. The skin was then closed with a 2.0 nylon and dermabond. A sterile dressing was applied.  The patient was awakened in the operating room and taken to recovery in stable condition. All sponge, needle, and instrument counts were correct at the end of the case.  Chelsea [name] was necessary for opening, closing, retracting, limb positioning and overall facilitation and completion of  the surgery.  Position: supine  Complications: none.  Time Out: performed   Drains/Packing: none  Estimated blood loss: minimal  Returned to Recovery Room: in good condition.   Antibiotics: yes   Mechanical VTE (DVT) Prophylaxis: sequential compression devices, TED thigh-high  Chemical VTE (DVT) Prophylaxis: eliquis  Fluid Replacement  Crystalloid: see anesthesia record Blood: none  FFP: none   Specimens Removed: 1 to pathology   Sponge and Instrument Count Correct? yes   PACU: portable radiograph - knee AP and Lateral   Plan/RTC: Return in 2 weeks for wound check.   Weight Bearing/Load Lower Extremity: full   Implant Name Type Inv. Item Serial No. Manufacturer Lot No. LRB No. Used Action  CEMENT BONE REFOBACIN [id number] [place] - [id number] Cement CEMENT BONE REFOBACIN [id number] [place]  ZIMMER RECON(ORTH,TRAU,BIO,SG) [id number] Right 1 Implanted  FEMUR CMT CR STD SZ 6 RT KNEE - [id number] Joint FEMUR CMT CR STD SZ 6 RT KNEE  ZIMMER RECON(ORTH,TRAU,BIO,SG) [id number] Right 1 Implanted  TIBIA STEM 5 DEG SZ E R KNEE - [id number] Knees TIBIA STEM 5 DEG SZ E R KNEE  ZIMMER RECON(ORTH,TRAU,BIO,SG) [id number] Right 1 Implanted  STEM TIB ST PERS 14+30 - [id number] Stem STEM TIB ST PERS 14+30  ZIMMER RECON(ORTH,TRAU,BIO,SG) [id number] Right 1 Implanted  STEM POLY PAT PLY 45M KNEE - [id number] Knees STEM POLY PAT PLY 45M KNEE  ZIMMER RECON(ORTH,TRAU,BIO,SG) [id number] Right 1 Implanted  INSERT TIB AS PERS SZ 6-7 14 - [id number] Insert INSERT TIB AS PERS  SZ 6-7 14  ZIMMER RECON(ORTH,TRAU,BIO,SG) [id number] Right 1 Implanted    N. [name], MD [facility] 3:16 PM

## 2023-12-06 NOTE — Anesthesia Postprocedure Evaluation (Signed)
Anesthesia Post Note  Patient: Chelsea [name]  Procedure(s) Performed: RIGHT TOTAL KNEE ARTHROPLASTY (Right: Knee)     Patient location during evaluation: PACU Anesthesia Type: Spinal Level of consciousness: awake and alert, patient cooperative and oriented Pain management: pain level controlled Vital Signs Assessment: post-procedure vital signs reviewed and stable Respiratory status: nonlabored ventilation, spontaneous breathing and respiratory function stable Cardiovascular status: blood pressure returned to baseline and stable Postop Assessment: no apparent nausea or vomiting and spinal receding Anesthetic complications: no   No notable events documented.  Last Vitals:  Vitals:   12/06/23 1545 12/06/23 1600  BP: 124/76 134/80  Pulse: 70 74  Resp: 17 (!) 24  Temp:    SpO2: 97% 99%    Last Pain:  Vitals:   12/06/23 1545  PainSc: 0-No pain                 Savoy Som[place],E. Zafar Debrosse

## 2023-12-06 NOTE — Transfer of Care (Signed)
Immediate Anesthesia Transfer of Care Note  Patient: Chelsea [name]  Procedure(s) Performed: RIGHT TOTAL KNEE ARTHROPLASTY (Right: Knee)  Patient Location: PACU  Anesthesia Type:MAC, Regional, and Spinal  Level of Consciousness: awake, alert , and oriented  Airway & Oxygen Therapy: Patient Spontanous Breathing and Patient connected to face mask oxygen  Post-op Assessment: Report given to RN and Post -op Vital signs reviewed and stable  Post vital signs: Reviewed and stable  Last Vitals:  Vitals Value Taken Time  BP 110/62 12/06/23 1534  Temp    Pulse 68 12/06/23 1539  Resp 22 12/06/23 1539  SpO2 83 % 12/06/23 1539  Vitals shown include unfiled device data.  Last Pain:  Vitals:   12/06/23 1219  PainSc: 0-No pain      Patients Stated Pain Goal: 0 (12/06/23 1219)  Complications: No notable events documented.

## 2023-12-06 NOTE — Anesthesia Procedure Notes (Addendum)
Anesthesia Regional Block: Adductor canal block   Pre-Anesthetic Checklist: , timeout performed,  Correct Patient, Correct Site, Correct Laterality,  Correct Procedure, Correct Position, site marked,  Risks and benefits discussed,  Surgical consent,  Pre-op evaluation,  At surgeon's request and post-op pain management  Laterality: Right and Lower  Prep: chloraprep       Needles:  Injection technique: Single-shot  Needle Type: Echogenic Needle     Needle Length: 9cm  Needle Gauge: 21     Additional Needles:   Procedures:,,,, ultrasound used (permanent image in chart),,    Narrative:  Start time: 12/06/2023 11:55 AM End time: 12/06/2023 12:01 PM Injection made incrementally with aspirations every 5 mL.  Performed by: Personally  Anesthesiologist: Jairo Ben, MD  Additional Notes: Pt identified in Holding room.  Monitors applied. Working IV access confirmed. Timeout, Sterile prep R thigh.  #21ga ECHOgenic Arrow block needle into adductor canal with US guidance.  20cc 0.75% Ropivacaine injected incrementally after negative test dose.  Patient asymptomatic, VSS, no heme aspirated, tolerated well.   Sandford Craze, MD

## 2023-12-06 NOTE — Discharge Instructions (Signed)

## 2023-12-06 NOTE — H&P (Signed)
PREOPERATIVE H&P  Chief Complaint: right knee osteoarthritis  HPI: Chelsea [name] is a 83 y.o. female who presents for surgical treatment of right knee osteoarthritis.  She denies any changes in medical history.  Past Surgical History:  Procedure Laterality Date   NO PAST SURGERIES     Social History   Socioeconomic History   Marital status: Widowed    Spouse name: Not on file   Number of children: Not on file   Years of education: Not on file   Highest education level: Not on file  Occupational History   Not on file  Tobacco Use   Smoking status: Never   Smokeless tobacco: Never  Vaping Use   Vaping status: Never Used  Substance and Sexual Activity   Alcohol use: No   Drug use: No   Sexual activity: Never  Other Topics Concern   Not on file  Social History Narrative   Not on file   Social Drivers of Health   Financial Resource Strain: Low Risk  (12/06/2022)   Received from [facility], [facility]   Overall Financial Resource Strain (CARDIA)    Difficulty of Paying Living Expenses: Not very hard  Food Insecurity: No Food Insecurity (12/06/2022)   Received from [facility], [facility]   Hunger Vital Sign    Worried About Running Out of Food in the Last Year: Never true    Ran Out of Food in the Last Year: Never true  Transportation Needs: No Transportation Needs (12/06/2022)   Received from [employer], [facility]   PRAPARE - Transportation    Lack of Transportation (Medical): No    Lack of Transportation (Non-Medical): No  Physical Activity: Insufficiently Active (12/02/2021)   Received from [facility], [facility]   Exercise Vital Sign    Days of Exercise per Week: 5 days    Minutes of Exercise per Session: 20 min  Stress: No Stress Concern Present (12/02/2021)   Received from [facility], [facility] - Occupational Stress Questionnaire    Feeling of Stress : Not at all  Social  Connections: Unknown (03/06/2022)   Received from [facility], [facility]   Social Network    Social Network: Not on file   Family History  Problem Relation Age of Onset   Heart failure Father    Allergies  Allergen Reactions   Eliquis [Apixaban] Rash   Penicillins Other (See Comments)   Shrimp [Shellfish Allergy] Hives   Xarelto [Rivaroxaban] Rash   Prior to Admission medications   Medication Sig Start Date End Date Taking? Authorizing Provider  acetaminophen (TYLENOL) 650 MG CR tablet Take 1,300 mg by mouth 2 (two) times daily.   Yes [provider]  apixaban (ELIQUIS) 5 MG TABS tablet Take 1 tablet (5 mg total) by mouth 2 (two) times daily. 12/22/22  Yes Swinyer, Zachary George, NP  diltiazem (CARDIZEM CD) 120 MG 24 hr capsule TAKE 1 CAPSULE BY MOUTH EVERY DAY 07/14/23  Yes Jake Bathe, MD  furosemide (LASIX) 40 MG tablet Take 1 tablet by mouth daily. 10/13/22  Yes [provider]  metoprolol succinate (TOPROL-XL) 50 MG 24 hr tablet Take 1 tablet (50 mg total) by mouth 2 (two) times daily. TAKE WITH OR IMMEDIATELY FOLLOWING A MEAL. 08/25/23  Yes Jake Bathe, MD  azelastine (OPTIVAR) 0.05 % ophthalmic solution Place 1 drop into the left eye daily. 10/23/23   [provider]  cephALEXin [facility])  500 MG capsule Take 1 capsule (500 mg total) by mouth 4 (four) times daily. 12/01/23   Elayne Snare K, DO  colchicine 0.6 MG tablet Take 0.6 mg by mouth daily. 11/11/23   [provider]  docusate sodium (COLACE) 100 MG capsule Take 1 capsule (100 mg total) by mouth daily as needed. 11/28/23 11/27/24  Cristie Hem, PA-C  HYDROcodone-acetaminophen (NORCO/VICODIN) 5-325 MG tablet Take 1 tablet by mouth every 6 (six) hours as needed for moderate pain (pain score 4-6) or severe pain (pain score 7-10). 11/11/23   [provider]  latanoprost (XALATAN) 0.005 % ophthalmic solution 1 drop at bedtime. 10/13/22   [provider]  methocarbamol  (ROBAXIN-750) 750 MG tablet Take 1 tablet (750 mg total) by mouth 2 (two) times daily as needed for muscle spasms. 11/28/23   Cristie Hem, PA-C  ondansetron (ZOFRAN) 4 MG tablet Take 1 tablet (4 mg total) by mouth every 8 (eight) hours as needed for nausea or vomiting. 11/28/23   Cristie Hem, PA-C  oxyCODONE-acetaminophen (PERCOCET) 5-325 MG tablet Take 1-2 tablets by mouth every 6 (six) hours as needed. To be taken after surgery 11/28/23   Cristie Hem, PA-C     Positive ROS: All other systems have been reviewed and were otherwise negative with the exception of those mentioned in the HPI and as above.  Physical Exam: General: Alert, no acute distress Cardiovascular: No pedal edema Respiratory: No cyanosis, no use of accessory musculature GI: abdomen soft Skin: No lesions in the area of chief complaint Neurologic: Sensation intact distally Psychiatric: Patient is competent for consent with normal mood and affect Lymphatic: no lymphedema  MUSCULOSKELETAL: exam stable  Assessment: right knee osteoarthritis  Plan: Plan for Procedure(s): RIGHT TOTAL KNEE ARTHROPLASTY  The risks benefits and alternatives were discussed with the patient including but not limited to the risks of nonoperative treatment, versus surgical intervention including infection, bleeding, nerve injury,  blood clots, cardiopulmonary complications, morbidity, mortality, among others, and they were willing to proceed.   [name], MD 12/06/2023 10:04 AM

## 2023-12-07 ENCOUNTER — Encounter (HOSPITAL_COMMUNITY): Payer: Self-pay | Admitting: Orthopaedic Surgery

## 2023-12-07 DIAGNOSIS — M1711 Unilateral primary osteoarthritis, right knee: Secondary | ICD-10-CM | POA: Diagnosis not present

## 2023-12-07 MED ORDER — APIXABAN 2.5 MG PO TABS
2.5000 mg | ORAL_TABLET | Freq: Two times a day (BID) | ORAL | Status: DC
  Administered 2023-12-07 – 2023-12-08 (×3): 2.5 mg via ORAL
  Filled 2023-12-07 (×3): qty 1

## 2023-12-07 NOTE — Progress Notes (Signed)
   Subjective:  Patient reports pain as moderate.  No events.  Objective:   VITALS:   Vitals:   12/06/23 1615 12/06/23 1630 12/06/23 1655 12/06/23 2100  BP: 131/71 (!) 141/77 122/76 (!) 147/82  Pulse: 78 85 80 74  Resp: 18 17 16 18   Temp:  (!) 97.5 F (36.4 C)  97.9 F (36.6 C)  TempSrc:    Axillary  SpO2: 96% 100% 97%   Weight:      Height:        Sensation intact distally Intact pulses distally Dorsiflexion/Plantar flexion intact Incision: dressing C/D/I and no drainage   Lab Results  Component Value Date   WBC 11.7 (H) 12/01/2023   HGB 12.7 12/01/2023   HCT 40.9 12/01/2023   MCV 94.2 12/01/2023   PLT 253 12/01/2023     Assessment/Plan:  1 Day Post-Op   - Up with PT/OT - DVT ppx - SCDs, ambulation, xarelto - WBAT operative extremity - Pain control - Discharge pending PT  [name] 12/07/2023, 7:23 AM

## 2023-12-07 NOTE — Progress Notes (Signed)
Pt states she feels dizzy with ambulation and extreme fatigue. States she does not feel comfortable going home today. Contacted Dr Roda Shutters to advise, okay with d/cing discharge orders and attempting discharge tomorrow.

## 2023-12-07 NOTE — Evaluation (Signed)
Occupational Therapy Evaluation Patient Details Name: Chelsea [name] MRN: [id number] DOB: [birth year] Today's Date: 12/07/2023   History of Present Illness Pt is a 83 y/o female presenting on 1/29 for R TKR.  PMH includes: arthritis, afib, CHF, HTN.   Clinical Impression   PTA patient independent with ADLs, IADLs and using RW for mobility. Admitted for above and presents with problem list below.  Completed transfers with min guard to close supervision, functional mobility using RW with min guard.  Requires min assist for LB dressing but educated on compensatory techniques, and pt will have support at home as needed.  No reports of dizziness during session, but pt does fatigue easily.  Based on performance today, anticipate patient will benefit from continued OT services acutely but no further needs after dc home. Will follow.       If plan is discharge home, recommend the following: A little help with walking and/or transfers;A lot of help with bathing/dressing/bathroom;Assistance with cooking/housework;Assist for transportation    Functional Status Assessment  Patient has had a recent decline in their functional status and demonstrates the ability to make significant improvements in function in a reasonable and predictable amount of time.  Equipment Recommendations  BSC/3in1    Recommendations for Other Services       Precautions / Restrictions Precautions Precautions: Fall;Knee Precaution Booklet Issued: Yes (comment) Restrictions Weight Bearing Restrictions Per Provider Order: Yes RLE Weight Bearing Per Provider Order: Weight bearing as tolerated      Mobility Bed Mobility               General bed mobility comments: OOB in recliner upon entry    Transfers Overall transfer level: Needs assistance Equipment used: Rolling walker (2 wheels) Transfers: Sit to/from Stand Sit to Stand: Supervision           General transfer comment: cueing for hand  placement      Balance Overall balance assessment: Needs assistance Sitting-balance support: Feet supported, No upper extremity supported Sitting balance-Leahy Scale: Good     Standing balance support: Bilateral upper extremity supported, During functional activity, No upper extremity supported Standing balance-Leahy Scale: Fair Standing balance comment: relies on RW                           ADL either performed or assessed with clinical judgement   ADL Overall ADL's : Needs assistance/impaired     Grooming: Contact guard assist;Standing           Upper Body Dressing : Set up;Sitting   Lower Body Dressing: Sit to/from stand;Minimal assistance Lower Body Dressing Details (indicate cue type and reason): requires assist with socks, discussed use of slip on shoes and donning R LE first Toilet Transfer: Contact guard assist;Ambulation;Rolling walker (2 wheels)   Toileting- Clothing Manipulation and Hygiene: Contact guard assist;Sit to/from stand       Functional mobility during ADLs: Contact guard assist;Rolling walker (2 wheels)       Vision   Vision Assessment?: No apparent visual deficits     Perception         Praxis         Pertinent Vitals/Pain Pain Assessment Pain Assessment: Faces Faces Pain Scale: Hurts little more Pain Location: R knee Pain Descriptors / Indicators: Discomfort, Grimacing, Guarding, Moaning Pain Intervention(s): Limited activity within patient's tolerance, Monitored during session, Repositioned     Extremity/Trunk Assessment Upper Extremity Assessment Upper Extremity Assessment: Overall WFL for tasks assessed  Lower Extremity Assessment Lower Extremity Assessment: Defer to PT evaluation RLE Deficits / Details: R TKA 12/06/23   Cervical / Trunk Assessment Cervical / Trunk Assessment: Normal   Communication Communication Communication: No apparent difficulties (speaks arabic, declined interpreter (daughter  interpreted as needed))   Cognition Arousal: Alert Behavior During Therapy: WFL for tasks assessed/performed Overall Cognitive Status: Within Functional Limits for tasks assessed                                       General Comments  no reports of dizziness during session    Exercises     Shoulder Instructions      Home Living Family/patient expects to be discharged to:: Private residence Living Arrangements: Alone Available Help at Discharge: Family;Available 24 hours/day Type of Home: House       Home Layout: Two level;Able to live on main level with bedroom/bathroom Alternate Level Stairs-Number of Steps: 5 Alternate Level Stairs-Rails: Right;Left Bathroom Shower/Tub: Tub/shower unit;Walk-in shower   Bathroom Toilet: Standard Bathroom Accessibility: Yes   Home Equipment: Agricultural consultant (2 wheels);Cane - single point          Prior Functioning/Environment Prior Level of Function : Driving;Independent/Modified Independent             Mobility Comments: Ind with mobility ADLs Comments: ind with ADL's and IADL's.        OT Problem List: Decreased strength;Decreased activity tolerance;Impaired balance (sitting and/or standing);Pain;Obesity;Decreased knowledge of precautions;Decreased knowledge of use of DME or AE      OT Treatment/Interventions: Self-care/ADL training;Therapeutic exercise;DME and/or AE instruction;Therapeutic activities;Patient/family education;Balance training    OT Goals(Current goals can be found in the care plan section) Acute Rehab OT Goals Patient Stated Goal: home OT Goal Formulation: With patient Time For Goal Achievement: 12/21/23 Potential to Achieve Goals: Good  OT Frequency: Min 1X/week    Co-evaluation              AM-PAC OT "6 Clicks" Daily Activity     Outcome Measure Help from another person eating meals?: None Help from another person taking care of personal grooming?: A Little Help from another  person toileting, which includes using toliet, bedpan, or urinal?: A Little Help from another person bathing (including washing, rinsing, drying)?: A Little Help from another person to put on and taking off regular upper body clothing?: A Little Help from another person to put on and taking off regular lower body clothing?: A Lot 6 Click Score: 18   End of Session Equipment Utilized During Treatment: Rolling walker (2 wheels) Nurse Communication: Mobility status  Activity Tolerance: Patient tolerated treatment well Patient left: in chair;with call bell/phone within reach;with family/visitor present  OT Visit Diagnosis: Other abnormalities of gait and mobility (R26.89);Muscle weakness (generalized) (M62.81);Pain Pain - Right/Left: Right Pain - part of body: Knee                Time: 1610-9604 OT Time Calculation (min): 17 min Charges:  OT General Charges $OT Visit: 1 Visit OT Evaluation $OT Eval Low Complexity: 1 Low  Barry Brunner, OT Acute Rehabilitation Services Office [phone]   Chancy Milroy 12/07/2023, 1:13 PM

## 2023-12-07 NOTE — Plan of Care (Signed)

## 2023-12-07 NOTE — Discharge Summary (Signed)
Patient ID: Chelsea [name] MRN: [id number] DOB/AGE: [birth year] 83 y.o.  Admit date: 12/06/2023 Discharge date: 12/07/2023  Admission Diagnoses:  Primary osteoarthritis of right knee  Discharge Diagnoses:  Principal Problem:   Primary osteoarthritis of right knee Active Problems:   Status post total right knee replacement   Past Medical History:  Diagnosis Date   Arthritis    Atrial fibrillation (HCC)    Atrial fibrillation with RVR (HCC) 03/04/2015   CHF (congestive heart failure) (HCC)    Hypertension    Sleep apnea    does not use cpap    Surgeries: Procedure(s): RIGHT TOTAL KNEE ARTHROPLASTY on 12/06/2023   Consultants (if any):   Discharged Condition: Improved  Hospital Course: Chelsea [name] is an 83 y.o. female who was admitted 12/06/2023 with a diagnosis of Primary osteoarthritis of right knee and went to the operating room on 12/06/2023 and underwent the above named procedures.    She was given perioperative antibiotics:  Anti-infectives (From admission, onward)    Start     Dose/Rate Route Frequency Ordered Stop   12/06/23 1930  ceFAZolin (ANCEF) IVPB 2g/100 mL premix        2 g 200 mL/hr over 30 Minutes Intravenous Every 6 hours 12/06/23 1648 12/07/23 0016   12/06/23 1418  vancomycin (VANCOCIN) powder  Status:  Discontinued          As needed 12/06/23 1418 12/06/23 1531   12/06/23 1015  ceFAZolin (ANCEF) IVPB 2g/100 mL premix        2 g 200 mL/hr over 30 Minutes Intravenous On call to O.R. 12/06/23 1011 12/06/23 1342     .  She was given sequential compression devices, early ambulation, and appropriate chemoprophylaxis for DVT prophylaxis.  She benefited maximally from the hospital stay and there were no complications.    Recent vital signs:  Vitals:   12/06/23 2100 12/07/23 0756  BP: (!) 147/82 (!) 130/99  Pulse: 74 66  Resp: 18 18  Temp: 97.9 F (36.6 C) 97.6 F (36.4 C)  SpO2:  97%    Recent laboratory studies:  Lab  Results  Component Value Date   HGB 12.7 12/01/2023   HGB 12.8 11/30/2023   HGB 12.9 12/06/2022   Lab Results  Component Value Date   WBC 11.7 (H) 12/01/2023   PLT 253 12/01/2023   Lab Results  Component Value Date   INR 1.4 (A) 05/09/2019   Lab Results  Component Value Date   NA 140 12/01/2023   K 3.9 12/01/2023   CL 103 12/01/2023   CO2 25 12/01/2023   BUN 14 12/01/2023   CREATININE 0.89 12/01/2023   GLUCOSE 110 (H) 12/01/2023    Discharge Medications:   Allergies as of 12/07/2023       Reactions   Eliquis [apixaban] Rash   Penicillins Other (See Comments)   Shrimp [shellfish Allergy] Hives   Xarelto [rivaroxaban] Rash        Medication List     STOP taking these medications    acetaminophen 650 MG CR tablet Commonly known as: TYLENOL   HYDROcodone-acetaminophen 5-325 MG tablet Commonly known as: NORCO/VICODIN       TAKE these medications    apixaban 5 MG Tabs tablet Commonly known as: ELIQUIS Take 1 tablet (5 mg total) by mouth 2 (two) times daily.   azelastine 0.05 % ophthalmic solution Commonly known as: OPTIVAR Place 1 drop into the left eye daily.   cephALEXin 500 MG capsule Commonly known  as: KEFLEX Take 1 capsule (500 mg total) by mouth 4 (four) times daily.   colchicine 0.6 MG tablet Take 0.6 mg by mouth daily.   diltiazem 120 MG 24 hr capsule Commonly known as: CARDIZEM CD TAKE 1 CAPSULE BY MOUTH EVERY DAY   docusate sodium 100 MG capsule Commonly known as: Colace Take 1 capsule (100 mg total) by mouth daily as needed.   furosemide 40 MG tablet Commonly known as: LASIX Take 1 tablet by mouth daily.   latanoprost 0.005 % ophthalmic solution Commonly known as: XALATAN 1 drop at bedtime.   methocarbamol 750 MG tablet Commonly known as: Robaxin-750 Take 1 tablet (750 mg total) by mouth 2 (two) times daily as needed for muscle spasms.   metoprolol succinate 50 MG 24 hr tablet Commonly known as: TOPROL-XL Take 1 tablet (50  mg total) by mouth 2 (two) times daily. TAKE WITH OR IMMEDIATELY FOLLOWING A MEAL.   ondansetron 4 MG tablet Commonly known as: Zofran Take 1 tablet (4 mg total) by mouth every 8 (eight) hours as needed for nausea or vomiting.   oxyCODONE-acetaminophen 5-325 MG tablet Commonly known as: Percocet Take 1-2 tablets by mouth every 6 (six) hours as needed. To be taken after surgery               Durable Medical Equipment  (From admission, onward)           Start     Ordered   12/06/23 1649  DME Walker rolling  Once       Question Answer Comment  Walker: With 5 Inch Wheels   Patient needs a walker to treat with the following condition Status post left partial knee replacement      12/06/23 1648   12/06/23 1649  DME 3 n 1  Once        12/06/23 1648   12/06/23 1649  DME Bedside commode  Once       Question:  Patient needs a bedside commode to treat with the following condition  Answer:  Status post left partial knee replacement   12/06/23 1648            Diagnostic Studies: DG Knee Right Port Result Date: 12/06/2023 CLINICAL DATA:  Right knee arthroplasty.  Postop. EXAM: PORTABLE RIGHT KNEE - 1-2 VIEW COMPARISON:  None Available. FINDINGS: Right knee arthroplasty in expected alignment. No periprosthetic lucency or fracture. There has been patellar resurfacing. Recent postsurgical change includes air and edema in the soft tissues and joint space. IMPRESSION: Right knee arthroplasty without immediate postoperative complication. Electronically Signed   By: Narda Rutherford M.D.   On: 12/06/2023 16:34   VAS [place] LOWER EXTREMITY VENOUS (DVT) (7a-7p) Result Date: 12/06/2023  Lower Venous DVT Study Patient Name:  CARMINE YOUNGBERG [facility]  Date of Exam:   12/01/2023 Medical Rec #: [id number]              Accession #:    [id number] Date of Birth: 07-24-1941              Patient Gender: F Patient Age:   83 years Exam Location:  [facility] Procedure:      VAS [place] LOWER EXTREMITY VENOUS  (DVT) Referring Phys: Cathren Laine --------------------------------------------------------------------------------  Indications: Pain, and Swelling.  Comparison Study: Previous LLEV exam on 02/04/2021 was negative for DVT Performing Technologist: Ernestene Mention RVT, RDMS  Examination Guidelines: A complete evaluation includes B-mode imaging, spectral Doppler, color Doppler, and power Doppler as needed of all  accessible portions of each vessel. Bilateral testing is considered an integral part of a complete examination. Limited examinations for reoccurring indications may be performed as noted. The reflux portion of the exam is performed with the patient in reverse Trendelenburg.  +-----+---------------+---------+-----------+----------+--------------+ RIGHTCompressibilityPhasicitySpontaneityPropertiesThrombus Aging +-----+---------------+---------+-----------+----------+--------------+ CFV  Full           Yes      Yes                                 +-----+---------------+---------+-----------+----------+--------------+   +---------+---------------+---------+-----------+----------+--------------+ LEFT     CompressibilityPhasicitySpontaneityPropertiesThrombus Aging +---------+---------------+---------+-----------+----------+--------------+ CFV      Full           Yes      Yes                                 +---------+---------------+---------+-----------+----------+--------------+ SFJ      Full                                                        +---------+---------------+---------+-----------+----------+--------------+ FV Prox  Full           Yes      Yes                                 +---------+---------------+---------+-----------+----------+--------------+ FV Mid   Full           Yes      Yes                                 +---------+---------------+---------+-----------+----------+--------------+ FV DistalFull           Yes      Yes                                  +---------+---------------+---------+-----------+----------+--------------+ PFV      Full                                                        +---------+---------------+---------+-----------+----------+--------------+ POP      Full           Yes      Yes                                 +---------+---------------+---------+-----------+----------+--------------+ PTV      Full                                                        +---------+---------------+---------+-----------+----------+--------------+ PERO     Full                                                        +---------+---------------+---------+-----------+----------+--------------+  Summary: RIGHT: - No evidence of common femoral vein obstruction.   LEFT: - There is no evidence of deep vein thrombosis in the lower extremity.  - A heterogenous cystic structure is found in the popliteal fossa (4.12 x 1.53 x 3.10 cm).  *See table(s) above for measurements and observations. Electronically signed by Lemar Livings MD on 12/06/2023 at 8:14:19 AM.    Final    DG Ankle Complete Left Result Date: 12/01/2023 CLINICAL DATA:  Pain and swelling EXAM: LEFT ANKLE COMPLETE - 3 VIEW COMPARISON:  None Available. FINDINGS: Global soft tissue swelling about the ankle. There is lucency with cortical irregularity along the anterior aspect of the calcaneus on the lateral view. There is also some irregularity along the subtalar joint. Osteopenia. No dislocation or fracture. IMPRESSION: Global soft tissue swelling the ankle with erosive appearance to the anterior aspect the calcaneus on lateral view. An aggressive lesion is possible. Please correlate for any clinical signs of infection. Otherwise further workup is recommended with MRI or bone scan to further delineate. Electronically Signed   By: Karen Kays M.D.   On: 12/01/2023 11:40    Disposition: Discharge disposition: 01-Home or Self Care       Discharge Instructions      Call MD / Call 911   Complete by: As directed    If you experience chest pain or shortness of breath, CALL 911 and be transported to the hospital emergency room.  If you develope a fever above 101.5 F, pus (white drainage) or increased drainage or redness at the wound, or calf pain, call your surgeon's office.   Constipation Prevention   Complete by: As directed    Drink plenty of fluids.  Prune juice may be helpful.  You may use a stool softener, such as Colace (over the counter) 100 mg twice a day.  Use MiraLax (over the counter) for constipation as needed.   Driving restrictions   Complete by: As directed    No driving while taking narcotic pain meds.   Increase activity slowly as tolerated   Complete by: As directed    Post-operative opioid taper instructions:   Complete by: As directed    POST-OPERATIVE OPIOID TAPER INSTRUCTIONS: It is important to wean off of your opioid medication as soon as possible. If you do not need pain medication after your surgery it is ok to stop day one. Opioids include: Codeine, Hydrocodone(Norco, Vicodin), Oxycodone(Percocet, oxycontin) and hydromorphone amongst others.  Long term and even short term use of opiods can cause: Increased pain response Dependence Constipation Depression Respiratory depression And more.  Withdrawal symptoms can include Flu like symptoms Nausea, vomiting And more Techniques to manage these symptoms Hydrate well Eat regular healthy meals Stay active Use relaxation techniques(deep breathing, meditating, yoga) Do Not substitute Alcohol to help with tapering If you have been on opioids for less than two weeks and do not have pain than it is ok to stop all together.  Plan to wean off of opioids This plan should start within one week post op of your joint replacement. Maintain the same interval or time between taking each dose and first decrease the dose.  Cut the total daily intake of opioids by one tablet each  day Next start to increase the time between doses. The last dose that should be eliminated is the evening dose.           Follow-up Information     Cristie Hem, PA-C. Schedule an appointment as soon as  possible for a visit in 2 week(s).   Specialty: Orthopedic Surgery Contact information: [address] [place] [address] [phone]                  Signed: [name] 12/07/2023, 8:00 AM

## 2023-12-07 NOTE — Evaluation (Signed)
Physical Therapy Evaluation Patient Details Name: Chelsea [name] MRN: [id number] DOB: 1941-05-25 Today's Date: 12/07/2023  History of Present Illness  Pt is a 83 y/o female presenting on 1/29 for R TKR.  PMH includes: arthritis, afib, CHF, HTN.  Clinical Impression  Pt is presenting below baseline level of functioning. Currently pt is CGA for bed mobility, sit to stand, gait and stairs per home set up. Pt has supportive family and is cleared from a physical therapy stand point to return home once medically ready. Due to pt current functional status, home set up and available assistance at home recommending skilled physical therapy services 3x/week in order to address strength, balance and functional mobility to decrease risk for falls, injury and re-hospitalization.           If plan is discharge home, recommend the following: Help with stairs or ramp for entrance;Assistance with cooking/housework;Assist for transportation     Equipment Recommendations BSC/3in1     Functional Status Assessment Patient has had a recent decline in their functional status and demonstrates the ability to make significant improvements in function in a reasonable and predictable amount of time.     Precautions / Restrictions Precautions Precautions: Fall;Knee Precaution Booklet Issued: Yes (comment) Restrictions Weight Bearing Restrictions Per Provider Order: No      Mobility  Bed Mobility Overal bed mobility: Needs Assistance Bed Mobility: Supine to Sit     Supine to sit: Contact guard     General bed mobility comments: Increased time, CGA for safety and pt concerns that she isn't able to move. Pt was able to move much better than she anticipated.    Transfers Overall transfer level: Needs assistance Equipment used: Rolling walker (2 wheels) Transfers: Sit to/from Stand Sit to Stand: Supervision           General transfer comment: verbal cues for safe hand placment when standing  with RW.    Ambulation/Gait Ambulation/Gait assistance: Contact guard assist Gait Distance (Feet): 80 Feet Assistive device: Rolling walker (2 wheels) Gait Pattern/deviations: Step-to pattern, Antalgic Gait velocity: decreased Gait velocity interpretation: <1.31 ft/sec, indicative of household ambulator   General Gait Details: Pt was educated on use of RW to off load RLE if needed for pain modulation. Pt requires intermittent verbal cues for keeping RW on the floor for smooth transition and for proximity to AD.  Stairs Stairs: Yes Stairs assistance: Contact guard assist Stair Management: One rail Right, Forwards, Sideways, Step to pattern Number of Stairs: 3 General stair comments: CGA for safety. Pt was educated on correct sequencing. Initially forward with step to gait pattern then for more stability pt cued to perform sideways     Balance Overall balance assessment: Mild deficits observed, not formally tested         Pertinent Vitals/Pain Pain Assessment Pain Assessment: Faces Faces Pain Scale: Hurts even more Pain Location: R knee Pain Descriptors / Indicators: Discomfort, Grimacing, Guarding, Moaning Pain Intervention(s): Limited activity within patient's tolerance, Monitored during session, Premedicated before session    Home Living Family/patient expects to be discharged to:: Private residence Living Arrangements: Alone Available Help at Discharge: Family Type of Home: House       Alternate Level Stairs-Number of Steps: 5 Home Layout: Two level;Able to live on main level with bedroom/bathroom Home Equipment: Rolling Walker (2 wheels);Cane - single point      Prior Function Prior Level of Function : Driving;Independent/Modified Independent             Mobility Comments:  Ind with mobility ADLs Comments: ind with ADL's and IADL's.     Extremity/Trunk Assessment   Upper Extremity Assessment Upper Extremity Assessment: Defer to OT evaluation    Lower  Extremity Assessment Lower Extremity Assessment: RLE deficits/detail;Overall [facility] for tasks assessed RLE Deficits / Details: R TKA 12/06/23    Cervical / Trunk Assessment Cervical / Trunk Assessment: Normal  Communication   Communication Communication: No apparent difficulties;Other (comment) (speaks arabic declined interpreter for daughter to interpret.)  Cognition Arousal: Alert, Lethargic, Suspect due to medications Behavior During Therapy: WFL for tasks assessed/performed, Restless Overall Cognitive Status: Within Functional Limits for tasks assessed          General Comments General comments (skin integrity, edema, etc.): Pt was reporting dizziness, improved after time with position changes. BP steady from sitting to standing.        Assessment/Plan    PT Assessment Patient needs continued PT services  PT Problem List Decreased strength;Decreased balance;Pain;Decreased range of motion;Decreased activity tolerance;Decreased mobility       PT Treatment Interventions DME instruction;Therapeutic activities;Gait training;Therapeutic exercise;Patient/family education;Stair training;Balance training;Functional mobility training    PT Goals (Current goals can be found in the Care Plan section)  Acute Rehab PT Goals Patient Stated Goal: To improve mobility and safely return home. PT Goal Formulation: With patient/family Time For Goal Achievement: 12/21/23 Potential to Achieve Goals: Good    Frequency Min 1X/week        AM-PAC PT "6 Clicks" Mobility  Outcome Measure Help needed turning from your back to your side while in a flat bed without using bedrails?: A Little Help needed moving from lying on your back to sitting on the side of a flat bed without using bedrails?: A Little Help needed moving to and from a bed to a chair (including a wheelchair)?: A Little Help needed standing up from a chair using your arms (e.g., wheelchair or bedside chair)?: A Little Help needed to  walk in hospital room?: A Little Help needed climbing 3-5 steps with a railing? : A Little 6 Click Score: 18    End of Session Equipment Utilized During Treatment: Gait belt Activity Tolerance: Patient tolerated treatment well Patient left: in chair;with family/visitor present;with call bell/phone within reach Nurse Communication: Mobility status PT Visit Diagnosis: Other abnormalities of gait and mobility (R26.89);Pain Pain - Right/Left: Right Pain - part of body: Knee    Time: 0910-0950 PT Time Calculation (min) (ACUTE ONLY): 40 min   Charges:   PT Evaluation $PT Eval Low Complexity: 1 Low PT Treatments $Gait Training: 8-22 mins $Therapeutic Activity: 8-22 mins PT General Charges $$ ACUTE PT VISIT: 1 Visit         Harrel Carina, DPT, CLT  Acute Rehabilitation Services Office: [phone] (Secure chat preferred)   Claudia Desanctis 12/07/2023, 10:00 AM

## 2023-12-07 NOTE — Care Management Obs Status (Signed)
MEDICARE OBSERVATION STATUS NOTIFICATION   Patient Details  Name: Chelsea [name] MRN: [id number] Date of Birth: 1941-05-20   Medicare Observation Status Notification Given:  Yes  Pt daughter [name] also present.  Lorri Frederick, LCSW 12/07/2023, 8:57 AM

## 2023-12-08 DIAGNOSIS — M1711 Unilateral primary osteoarthritis, right knee: Secondary | ICD-10-CM | POA: Diagnosis not present

## 2023-12-08 NOTE — Progress Notes (Addendum)
Subjective: 2 Days Post-Op Procedure(s) (LRB): RIGHT TOTAL KNEE ARTHROPLASTY (Right) Patient reports pain as mild.  Feeling much better today  Objective: Vital signs in last 24 hours: Temp:  [97.3 F (36.3 C)-98.7 F (37.1 C)] 98.7 F (37.1 C) (01/31 0413) Pulse Rate:  [66-79] 79 (01/31 0413) Resp:  [17-18] 18 (01/31 0413) BP: (129-145)/(69-99) 145/86 (01/31 0413) SpO2:  [95 %-99 %] 99 % (01/31 0413)  Intake/Output from previous day: No intake/output data recorded. Intake/Output this shift: No intake/output data recorded.  No results for input(s): "HGB" in the last 72 hours. No results for input(s): "WBC", "RBC", "HCT", "PLT" in the last 72 hours. No results for input(s): "NA", "K", "CL", "CO2", "BUN", "CREATININE", "GLUCOSE", "CALCIUM" in the last 72 hours. No results for input(s): "LABPT", "INR" in the last 72 hours.  Neurologically intact Neurovascular intact Sensation intact distally Intact pulses distally Dorsiflexion/Plantar flexion intact Incision: dressing C/D/I No cellulitis present Compartment soft   Assessment/Plan: 2 Days Post-Op Procedure(s) (LRB): RIGHT TOTAL KNEE ARTHROPLASTY (Right) Advance diet Up with therapy D/C IV fluids Discharge home with home health once cleared by PT (likely today) WBAT RLE Post-op pain meds called in prior to surgery      Cristie Hem 12/08/2023, 7:39 AM

## 2023-12-08 NOTE — Progress Notes (Signed)
Pt is cleared and ready for d/c from a PT standpoint. TOC and care team notified. No DME needs. Full PT treatment note to follow.   Conni Slipper, PT, DPT Acute Rehabilitation Services Secure Chat Preferred Office: [phone]

## 2023-12-08 NOTE — Progress Notes (Addendum)
Pt discharged to home. DC completed by Oneal Grout, LPN as discharge nurse. See nurse's note for details. Pt left unit in wheelchair pushed by nurse tech from discharge lounge to discharge lounge. Left in stable condition.

## 2023-12-08 NOTE — TOC Transition Note (Addendum)
Transition of Care [facility]) - Discharge Note   Patient Details  Name: Chelsea [name] MRN: [id number] Date of Birth: [birth year]  [facility] CM/SW Contact:  Epifanio Lesches, RN Phone Number: 12/08/2023, 10:30 AM  Clinical Narrative:    Patient will DC to: home Anticipated DC date: Family notified: Transport by:       - Elvera Lennox TKR , 1/29 Per MD patient ready for DC today pending PT clearance. RN, patient, and patient's family notified of DC.   Family to assist with care once d/c to home. Pt without DME needs. Declined BSC and RW. States has equipment @ home. Post hospital f/u noted on AVS. Pt without RX med concerns. Family to provide transportation to home.  RNCM will sign off for now as intervention is no longer needed. Please consult [place] again if new needs arise.    Final next level of care: Home w Home Health Services Barriers to Discharge: No Barriers Identified   Patient Goals and CMS Choice     Choice offered to / list presented to : Patient      Discharge Placement                       Discharge Plan and Services Additional resources added to the After Visit Summary for                  DME Arranged: Bedside commode DME Agency: [employer] Date DME Agency Contacted: 12/07/23 Time DME Agency Contacted: [phone] Representative spoke with at DME Agency: Vaughan Basta HH Arranged: PT HH Agency: [facility] Date [facility] Agency Contacted: 12/07/23 Time HH Agency Contacted: [phone] Representative spoke with at [facility] Agency: Haywood Lasso  Social Drivers of Health (SDOH) Interventions SDOH Screenings   Food Insecurity: No Food Insecurity (12/06/2023)  Housing: Low Risk  (12/06/2023)  Transportation Needs: No Transportation Needs (12/06/2023)  Utilities: Not At Risk (12/06/2023)  Financial Resource Strain: Low Risk  (12/06/2022)   Received from [facility]  Physical Activity: Insufficiently Active (12/02/2021)   Received from [facility], [facility]  Social Connections: Moderately Isolated (12/06/2023)  Stress: No Stress Concern Present (12/02/2021)   [facility] from [facility], [facility]  Tobacco Use: Low Risk  (12/06/2023)     Readmission Risk Interventions     No data to display

## 2023-12-08 NOTE — Progress Notes (Signed)
Physical Therapy Treatment  Patient Details Name: Chelsea [name] MRN: [id number] DOB: [birth year] Today's Date: 12/08/2023   History of Present Illness Pt is a 83 y/o female presenting on 1/29 for R TKR.  PMH includes: arthritis, afib, CHF, HTN.    PT Comments  Pt progressing towards physical therapy goals. She was able to perform transfers and ambulation with gross supervision for safety and RW for support. Pt with difficulty completing 10+ reps of therapeutic exercise and preferred to do 5 reps of some exercises. Pt educated on attempting to progress number of reps as able at d/c. Pt educated on car transfer, HEP, positioning recommendations, and recommended activity tolerance. Pt is safe for d/c from a PT standpoint. Will continue to follow and progress as able per POC.     If plan is discharge home, recommend the following: Help with stairs or ramp for entrance;Assistance with cooking/housework;Assist for transportation   Can travel by private vehicle        Equipment Recommendations  BSC/3in1    Recommendations for Other Services       Precautions / Restrictions Precautions Precautions: Fall;Knee Precaution Booklet Issued: Yes (comment) Precaution Comments: Pt was educated that NO pillow should be placed under the knee and that knee should be resting in extension. Restrictions Weight Bearing Restrictions Per Provider Order: Yes RLE Weight Bearing Per Provider Order: Weight bearing as tolerated     Mobility  Bed Mobility Overal bed mobility: Needs Assistance Bed Mobility: Supine to Sit     Supine to sit: Contact guard     General bed mobility comments: OOB in recliner upon entry    Transfers Overall transfer level: Needs assistance Equipment used: Rolling walker (2 wheels) Transfers: Sit to/from Stand Sit to Stand: Supervision           General transfer comment: Light supervision for safety. Pt demonstrated proper hand placement on seated surface for  safety.    Ambulation/Gait Ambulation/Gait assistance: Supervision Gait Distance (Feet): 220 Feet (x2) Assistive device: Rolling walker (2 wheels) Gait Pattern/deviations: Antalgic, Step-through pattern, Decreased stride length, Trunk flexed Gait velocity: Decreased Gait velocity interpretation: 1.31 - 2.62 ft/sec, indicative of limited community ambulator   General Gait Details: Pt was able to ambulate the length of the unit to get to the rehab gym for stair training. She was able to ambulate back to the room without a seated rest break. 440' total with RW for support. Pt was cued for step-through gait pattern and was able to demonstrate a smooth gait with fluid walker movement.   Stairs Stairs: Yes Stairs assistance: Contact guard assist Stair Management: One rail Right, Forwards, Step to pattern Number of Stairs: 5 General stair comments: 5 stairs total on practice set of stairs in the rehab gym.   Wheelchair Mobility     Tilt Bed    Modified Rankin (Stroke Patients Only)       Balance Overall balance assessment: Needs assistance Sitting-balance support: Feet supported, No upper extremity supported Sitting balance-Leahy Scale: Good     Standing balance support: Bilateral upper extremity supported, During functional activity, No upper extremity supported Standing balance-Leahy Scale: Fair Standing balance comment: able to stand at sink for self care tasks with no UE support                            Cognition Arousal: Alert Behavior During Therapy: [facility] for tasks assessed/performed Overall Cognitive Status: Within Functional Limits for tasks assessed  Exercises Total Joint Exercises Ankle Circles/Pumps: 5 reps Quad Sets: 5 reps Short Arc Quad: 10 reps Heel Slides: 15 reps Hip ABduction/ADduction: 10 reps Straight Leg Raises: 5 reps Long Arc Quad: 10 reps Goniometric ROM: ~90 R knee  flexion AROM in sitting.    General Comments        Pertinent Vitals/Pain Pain Assessment Pain Assessment: Faces Faces Pain Scale: Hurts little more Pain Location: R knee Pain Descriptors / Indicators: Discomfort, Grimacing, Guarding, Moaning Pain Intervention(s): Limited activity within patient's tolerance, Monitored during session, Repositioned    Home Living                          Prior Function            PT Goals (current goals can now be found in the care plan section) Acute Rehab PT Goals Patient Stated Goal: To improve mobility and safely return home. PT Goal Formulation: With patient/family Time For Goal Achievement: 12/21/23 Potential to Achieve Goals: Good Progress towards PT goals: Progressing toward goals    Frequency    Min 1X/week      PT Plan      Co-evaluation              AM-PAC PT "6 Clicks" Mobility   Outcome Measure  Help needed turning from your back to your side while in a flat bed without using bedrails?: A Little Help needed moving from lying on your back to sitting on the side of a flat bed without using bedrails?: A Little Help needed moving to and from a bed to a chair (including a wheelchair)?: A Little Help needed standing up from a chair using your arms (e.g., wheelchair or bedside chair)?: A Little Help needed to walk in hospital room?: A Little Help needed climbing 3-5 steps with a railing? : A Little 6 Click Score: 18    End of Session Equipment Utilized During Treatment: Gait belt Activity Tolerance: Patient tolerated treatment well Patient left: in chair;with family/visitor present;with call bell/phone within reach Nurse Communication: Mobility status PT Visit Diagnosis: Other abnormalities of gait and mobility (R26.89);Pain Pain - Right/Left: Right Pain - part of body: Knee     Time: 1610-9604 PT Time Calculation (min) (ACUTE ONLY): 28 min  Charges:    $Gait Training: 8-22 mins $Therapeutic  Exercise: 8-22 mins PT General Charges $$ ACUTE PT VISIT: 1 Visit                     Conni Slipper, PT, DPT Acute Rehabilitation Services Secure Chat Preferred Office: [phone]    Marylynn Pearson 12/08/2023, 2:03 PM

## 2023-12-08 NOTE — Plan of Care (Signed)

## 2023-12-08 NOTE — Progress Notes (Signed)
Occupational Therapy Treatment Patient Details Name: Chelsea [name] MRN: [id number] DOB: Nov 24, 1940 Today's Date: 12/08/2023   History of present illness Pt is a 83 y/o female presenting on 1/29 for R TKR.  PMH includes: arthritis, afib, CHF, HTN.   OT comments  Patient demonstrates good gain with OT treatment. Patient in bathroom upon entry with nursing staff assisted to toilet. Patient able to perform toilet hygiene with mod I and no assistance to exit toilet. Patient able to perform grooming tasks standing at sink with distant supervision. LB dressing addressed seated in recliner with mod assist to doff and donn. Patient states family can assist until she can do on her own. Patient should be appropriate to return home from OT/self care point of view.       If plan is discharge home, recommend the following:  A little help with walking and/or transfers;A lot of help with bathing/dressing/bathroom;Assistance with cooking/housework;Assist for transportation   Equipment Recommendations  None recommended by OT    Recommendations for Other Services      Precautions / Restrictions Precautions Precautions: Fall;Knee Precaution Booklet Issued: Yes (comment) Restrictions Weight Bearing Restrictions Per Provider Order: Yes RLE Weight Bearing Per Provider Order: Weight bearing as tolerated       Mobility Bed Mobility Overal bed mobility: Needs Assistance             General bed mobility comments: OOB in recliner upon entry    Transfers Overall transfer level: Needs assistance Equipment used: Rolling walker (2 wheels) Transfers: Sit to/from Stand Sit to Stand: Supervision           General transfer comment: cueing for hand placement     Balance Overall balance assessment: Needs assistance Sitting-balance support: Feet supported, No upper extremity supported Sitting balance-Leahy Scale: Good     Standing balance support: Bilateral upper extremity supported,  During functional activity, No upper extremity supported Standing balance-Leahy Scale: Fair Standing balance comment: able to stand at sink for self care tasks with no UE support                           ADL either performed or assessed with clinical judgement   ADL Overall ADL's : Needs assistance/impaired     Grooming: Wash/dry hands;Wash/dry face;Oral care;Supervision/safety;Standing Grooming Details (indicate cue type and reason): at sink         Upper Body Dressing : Supervision/safety;Standing Upper Body Dressing Details (indicate cue type and reason): gown for back Lower Body Dressing: Moderate assistance;Sitting/lateral leans Lower Body Dressing Details (indicate cue type and reason): required mod assist to doff and donn socks Toilet Transfer: Supervision/safety;Ambulation;Regular Toilet;Rolling walker (2 wheels)   Toileting- Clothing Manipulation and Hygiene: Modified independent       Functional mobility during ADLs: Supervision/safety General ADL Comments: difficulty reaching feet for donning socks and min assist for donning pants, supervision for all other self care tasks    Extremity/Trunk Assessment              Vision       Perception     Praxis      Cognition Arousal: Alert Behavior During Therapy: WFL for tasks assessed/performed Overall Cognitive Status: Within Functional Limits for tasks assessed                                          Exercises  Shoulder Instructions       General Comments      Pertinent Vitals/ Pain       Pain Assessment Pain Assessment: Faces Faces Pain Scale: Hurts little more Pain Location: R knee Pain Descriptors / Indicators: Discomfort, Grimacing, Guarding, Moaning Pain Intervention(s): Monitored during session, Repositioned  Home Living                                          Prior Functioning/Environment              Frequency  Min 1X/week         Progress Toward Goals  OT Goals(current goals can now be found in the care plan section)  Progress towards OT goals: Progressing toward goals  Acute Rehab OT Goals Patient Stated Goal: to go home OT Goal Formulation: With patient Time For Goal Achievement: 12/21/23 Potential to Achieve Goals: Good ADL Goals Pt Will Perform Grooming: with modified independence;standing Pt Will Perform Lower Body Dressing: with modified independence;sit to/from stand;sitting/lateral leans;with adaptive equipment Pt Will Transfer to Toilet: with modified independence;ambulating Pt Will Perform Toileting - Clothing Manipulation and hygiene: with modified independence;sitting/lateral leans;sit to/from stand Pt Will Perform Tub/Shower Transfer: Shower transfer;with modified independence;3 in 1;ambulating;shower seat;rolling walker  Plan      Co-evaluation                 AM-PAC OT "6 Clicks" Daily Activity     Outcome Measure   Help from another person eating meals?: None Help from another person taking care of personal grooming?: A Little Help from another person toileting, which includes using toliet, bedpan, or urinal?: A Little Help from another person bathing (including washing, rinsing, drying)?: A Little Help from another person to put on and taking off regular upper body clothing?: A Little Help from another person to put on and taking off regular lower body clothing?: A Lot 6 Click Score: 18    End of Session Equipment Utilized During Treatment: Rolling walker (2 wheels)  OT Visit Diagnosis: Other abnormalities of gait and mobility (R26.89);Muscle weakness (generalized) (M62.81);Pain Pain - Right/Left: Right Pain - part of body: Knee   Activity Tolerance Patient tolerated treatment well   Patient Left in chair;with call bell/phone within reach   Nurse Communication Mobility status        Time: 1610-9604 OT Time Calculation (min): 13 min  Charges: OT General  Charges $OT Visit: 1 Visit OT Treatments $Self Care/Home Management : 8-22 mins  Alfonse Flavors, OTA Acute Rehabilitation Services  Office [phone]   Dewain Penning 12/08/2023, 11:56 AM

## 2023-12-21 ENCOUNTER — Ambulatory Visit (INDEPENDENT_AMBULATORY_CARE_PROVIDER_SITE_OTHER): Payer: Medicare Other | Admitting: Physician Assistant

## 2023-12-21 ENCOUNTER — Encounter: Payer: Self-pay | Admitting: Physician Assistant

## 2023-12-21 DIAGNOSIS — Z96651 Presence of right artificial knee joint: Secondary | ICD-10-CM

## 2023-12-21 MED ORDER — OXYCODONE-ACETAMINOPHEN 5-325 MG PO TABS: 1.0000 | ORAL_TABLET | Freq: Four times a day (QID) | ORAL | 0 refills | Status: DC | PRN

## 2023-12-21 MED ORDER — METHOCARBAMOL 750 MG PO TABS: 750.0000 mg | ORAL_TABLET | Freq: Two times a day (BID) | ORAL | 2 refills | Status: DC | PRN

## 2023-12-21 NOTE — Progress Notes (Signed)
Post-Op Visit Note   Patient: Chelsea [name]           Date of Birth: 1941-04-21           MRN: [id number] Visit Date: 12/21/2023 PCP: Medicine, [facility]   Assessment & Plan:  Chief Complaint:  Chief Complaint  Patient presents with   Right Knee - Pain   Visit Diagnoses:  1. Status post total right knee replacement     Plan: Patient is a pleasant 83 year old female who is here today 2 weeks status post right total knee replacement 12/06/2023.  She has been doing well.  She is getting home health physical therapy and is ambulating with a walker.  She is taking Percocet and Robaxin for pain.  She is on chronic Eliquis for A-fib.  Examination of the right knee reveals a well-healing surgical incision with nylon sutures in place.  No evidence of infection or cellulitis.  Calves are soft and nontender.  She is neurovascularly intact distally.  Today, sutures were removed and Steri-Strips applied.  I refilled her Percocet and Robaxin.  She will continue with Eliquis.  Postop instructions provided.  Outpatient referral for PT was sent in.  She will follow-up in 4 weeks for repeat evaluation and 2 view x-rays of the right knee.  Call with concerns or questions.  Follow-Up Instructions: Return in about 4 weeks (around 01/18/2024).   Orders:  Orders Placed This Encounter  Procedures   Ambulatory referral to Physical Therapy   Meds ordered this encounter  Medications   oxyCODONE-acetaminophen (PERCOCET) 5-325 MG tablet    Sig: Take 1 tablet by mouth every 6 (six) hours as needed. To be taken after surgery    Dispense:  40 tablet    Refill:  0   methocarbamol (ROBAXIN-750) 750 MG tablet    Sig: Take 1 tablet (750 mg total) by mouth 2 (two) times daily as needed for muscle spasms.    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   Primary osteoarthritis of right knee 12/03/2020   Chronic anticoagulation 03/06/2015   Edema-dopplers negative for DVT 03/06/2015   CHF exacerbation (HCC)    SOB (shortness of breath)    Chest pain    Acute on chronic diastolic congestive heart failure (HCC)    Atrial fibrillation with RVR (HCC) 03/04/2015   Essential hypertension 02/24/2015   Chronic diastolic heart failure (HCC) 12/10/2014   Permanent atrial fibrillation (HCC) 12/10/2014   Paroxysmal nocturnal dyspnea 12/10/2014   Past Medical History:  Diagnosis Date   Arthritis    Atrial fibrillation (HCC)    Atrial fibrillation with RVR (HCC) 03/04/2015   CHF (congestive heart failure) (HCC)    Hypertension    Sleep apnea    does not use cpap    Family History  Problem Relation Age of Onset   Heart failure Father     Past Surgical History:  Procedure Laterality Date   NO PAST SURGERIES     TOTAL KNEE ARTHROPLASTY Right 12/06/2023   Procedure: RIGHT TOTAL KNEE ARTHROPLASTY;  Surgeon: Tarry Kos, MD;  Location: [facility] OR;  Service: Orthopedics;  Laterality: Right;   Social History   Occupational History  Not on file  Tobacco Use   Smoking status: Never   Smokeless tobacco: Never  Vaping Use   Vaping status: Never Used  Substance and Sexual Activity   Alcohol use: No   Drug use: No   Sexual activity: Never

## 2024-01-02 ENCOUNTER — Other Ambulatory Visit: Payer: Self-pay

## 2024-01-02 ENCOUNTER — Ambulatory Visit: Payer: Medicare Other | Attending: Physician Assistant | Admitting: Physical Therapy

## 2024-01-02 ENCOUNTER — Encounter: Payer: Self-pay | Admitting: Physical Therapy

## 2024-01-02 DIAGNOSIS — R262 Difficulty in walking, not elsewhere classified: Secondary | ICD-10-CM | POA: Insufficient documentation

## 2024-01-02 DIAGNOSIS — M6281 Muscle weakness (generalized): Secondary | ICD-10-CM | POA: Insufficient documentation

## 2024-01-02 DIAGNOSIS — Z96651 Presence of right artificial knee joint: Secondary | ICD-10-CM | POA: Insufficient documentation

## 2024-01-02 DIAGNOSIS — M25561 Pain in right knee: Secondary | ICD-10-CM | POA: Insufficient documentation

## 2024-01-02 NOTE — Therapy (Signed)
 OUTPATIENT PHYSICAL THERAPY LOWER EXTREMITY EVALUATION   Patient Name: Chelsea [name] MRN: [id number] DOB:1941/01/27, 83 y.o., female Today's Date: 01/02/2024  END OF SESSION:  PT End of Session - 01/02/24 1255     Visit Number 1    Number of Visits 16    Date for PT Re-Evaluation 03/01/24    Authorization Type MCR/ MCD    PT Start Time 1113    PT Stop Time 1145    PT Time Calculation (min) 32 min    Activity Tolerance Patient limited by pain    Behavior During Therapy [facility] for tasks assessed/performed             Past Medical History:  Diagnosis Date   Arthritis    Atrial fibrillation (HCC)    Atrial fibrillation with RVR (HCC) 03/04/2015   CHF (congestive heart failure) (HCC)    Hypertension    Sleep apnea    does not use cpap   Past Surgical History:  Procedure Laterality Date   NO PAST SURGERIES     TOTAL KNEE ARTHROPLASTY Right 12/06/2023   Procedure: RIGHT TOTAL KNEE ARTHROPLASTY;  Surgeon: Tarry Kos, MD;  Location: [facility] OR;  Service: Orthopedics;  Laterality: Right;   Patient Active Problem List   Diagnosis Date Noted   Status post total right knee replacement 12/06/2023   Pitting edema 12/05/2023   Primary osteoarthritis of left knee 02/23/2021   Ankle impingement syndrome, left 02/23/2021   Primary osteoarthritis of right knee 12/03/2020   Chronic anticoagulation 03/06/2015   Edema-dopplers negative for DVT 03/06/2015   CHF exacerbation (HCC)    SOB (shortness of breath)    Chest pain    Acute on chronic diastolic congestive heart failure (HCC)    Atrial fibrillation with RVR (HCC) 03/04/2015   Essential hypertension 02/24/2015   Chronic diastolic heart failure (HCC) 12/10/2014   Permanent atrial fibrillation (HCC) 12/10/2014   Paroxysmal nocturnal dyspnea 12/10/2014    PCP: Medicine, [facility]   REFERRING PROVIDER: Cristie Hem, PA-C   REFERRING DIAG: [phone] (ICD-10-CM) - Status post total right knee  replacement   THERAPY DIAG:  Acute pain of right knee - Plan: PT plan of care cert/re-cert  Difficulty in walking, not elsewhere classified - Plan: PT plan of care cert/re-cert  Muscle weakness (generalized) - Plan: PT plan of care cert/re-cert  Rationale for Evaluation and Treatment: Rehabilitation  ONSET DATE: 12-06-23  SUBJECTIVE:   SUBJECTIVE STATEMENT: Pain isnt too bad, I am getting better every day. I hope to get back to walking. I love walking.  I use a chair for Muslim prayer. I would like to get back to the floor. I just don't want to be in pain anymore. I live alone and must be able to care for myself.  I would like to be able to walk for exercise and take care of my home. I can only stand about 10 minutes and walk for about 10-15 minutes  PERTINENT HISTORY: Gout, Afib,  HBP , HTN, R TKA 12-06-23 PAIN:  Are you having pain? Yes: NPRS scale: 4/10 present  6/10 Pain location: Right knee Pain description: spasm Aggravating factors: stairs, standing  walking of longer than 10 min, bending knee and straightening knees.  Unable to kneel Relieving factors: ice and meds  PRECAUTIONS: None  RED FLAGS: None   WEIGHT BEARING RESTRICTIONS: Yes WBAT R  FALLS:  Has patient fallen in last 6 months? No  LIVING ENVIRONMENT: Lives with: lives alone  Lives in: House/apartment Stairs:  entering the house is 5 steps with rail   Has following equipment at home: Single point cane and Walker - 2 wheeled  OCCUPATION: retired  widowed  PLOF: Independent  PATIENT GOALS: get back to walking and prayer position and  care for herself alone in home  NEXT MD VISIT: TBD  OBJECTIVE:  Note: Objective measures were completed at Evaluation unless otherwise noted.  DIAGNOSTIC FINDINGS: FINDINGS: Right knee arthroplasty in expected alignment. No periprosthetic lucency or fracture. There has been patellar resurfacing. Recent postsurgical change includes air and edema in the soft tissues  and joint space.   IMPRESSION: Right knee arthroplasty without immediate postoperative complication.     Electronically Signed   By: Narda Rutherford M.D.   On: 12/06/2023 16:34  PATIENT SURVEYS:  LEFS 29/80   36.3%  COGNITION: Overall cognitive status: Within functional limits for tasks assessed     SENSATION: WFL  EDEMA:  Circumferential: Right 47 .5 cm  MUSCLE LENGTH: Hamstrings: Right 64 deg; Left 65 deg   POSTURE: rounded shoulders, forward head, and obesity  PALPATION: Pt with well healing scar from R TKA, TTP and hypersensitivity over fat pad  LOWER EXTREMITY ROM:  Active ROM Right eval Left eval  Hip flexion    Hip extension    Hip abduction    Hip adduction    Hip internal rotation    Hip external rotation    Knee flexion 108/112 P 140  Knee extension -12 0  Ankle dorsiflexion 0 8  Ankle plantarflexion    Ankle inversion    Ankle eversion     (Blank rows = not tested) (Key: WFL = within functional limits not formally assessed, * = concordant pain, s = stiffness/stretching sensation, NT = not tested)  LOWER EXTREMITY MMT:  MMT Right eval Left eval  Hip flexion 4 4+  Hip extension 4- 4  Hip abduction 4- 4-  Hip adduction    Hip internal rotation    Hip external rotation    Knee flexion 3- 4+  Knee extension 3- 4  Ankle dorsiflexion    Ankle plantarflexion    Ankle inversion    Ankle eversion     (Blank rows = not tested)  LOWER EXTREMITY SPECIAL TESTS:  NT due to post surgery  FUNCTIONAL TESTS:  5 times sit to stand: 19:16 sec    TBD GAIT: Distance walked: Pt able to walk without AD into clinic about 150 ft Assistive device utilized: Single point cane and Walker - 2 wheeled Level of assistance: Complete Independence Comments: Pt with slight antalgic gait  no AD in clinic                                                                                                                                 TREATMENT DATE: 01-02-24   Eval , issue HEP, RICE    PATIENT EDUCATION:  Education details: POC  Explanation of findings, issue HEP,  PRICE education Person educated: Patient and dtr Education method: Explanation, Demonstration, Tactile cues, Verbal cues, and Handouts Education comprehension: verbalized understanding, returned demonstration, verbal cues required, tactile cues required, and needs further education  HOME EXERCISE PROGRAM: Access Code: [email or web address] URL: [email or web address] Date: 01/02/2024 Prepared by: Garen Lah  Exercises - Supine Knee Extension Stretch on Towel Roll  - 1 x daily - 7 x weekly - 3 sets - 10 reps - 5 sec  hold - Supine Heel Slide with Strap  - 1 x daily - 7 x weekly - 3 sets - 10 reps - Supine Short Arc Quad  - 1 x daily - 7 x weekly - 3 sets - 10 reps - Hamstring Stretch with Strap  - 1 x daily - 7 x weekly - 3 sets - 3-5 reps - 15-30 sec hold - Long Arc Quad  - 1 x daily - 7 x weekly - 3 sets - 10 reps - Mini Squat  - 1 x daily - 7 x weekly - 3 sets - 10 reps  ASSESSMENT:  CLINICAL IMPRESSION: Patient is a 83 y.o. female who was seen today for physical therapy evaluation and treatment for s/p R  TKA on 12/06/23 by Dr Roda Shutters  Pt presents with impairments including pain, knee weakness, impaired ROM, difficulty with walking, stairs, and with transfers . Pt would benefit from skilled PT for 2 times a week for 8 weeks to address above impariments and functional limitations and return to pain-free PLOF.  OBJECTIVE IMPAIRMENTS: decreased activity tolerance, difficulty walking, decreased ROM, decreased strength, increased edema, obesity, and pain.   ACTIVITY LIMITATIONS: carrying, bending, standing, squatting, stairs, transfers, and locomotion level  PARTICIPATION LIMITATIONS: meal prep, cleaning, laundry, shopping, community activity, and mosque attendance and ablility to assume prayer position  PERSONAL FACTORS: Gout, Afib,  HBP , HTN, R TKA 12-06-23 are also affecting  patient's functional outcome.   REHAB POTENTIAL: Excellent  CLINICAL DECISION MAKING: Stable/uncomplicated  EVALUATION COMPLEXITY: Low   GOALS: Goals reviewed with patient? Yes  SHORT TERM GOALS: Target date: 03-04-24  Independent with initial HEP Baseline:no knowledge Goal status: INITIAL  2.  Report pain decrease at rest from   6/10 to   3/10. Baseline: 6/10 at worst Goal status: INITIAL  3.  Demonstrate and verbalize understanding of condition management including RICE, positioning, use of A.D., HEP.  Baseline: no knowledge Goal status: INITIAL  4.  AROM of knee extension -10 to 100 flexion to increase mobility for transitional movements Baseline: See AROM Goal status: INITIAL  5.  Pt will begin home walking program up to 30 minutes a day minimum Baseline: unable to walk due to pain  Goal status: INITIAL    LONG TERM GOALS: Target date: 03-01-24  Pt will be independent with advanced HEP Baseline: no knowledge Goal status: INITIAL  2.  Pt will be able to negotiate steps without exacerbation of pain greater than 1/10  Baseline: avoids steps due to pain before eval Goal status: INITIAL  3.  Pt will improve her R knee flexion to  </= 120 degrees and extension to </= 5 degrees with </= 2/10 pain for a more functional and efficient gait pattern Baseline: See AROM chart Goal status: INITIAL  4.  LEFS will improve from 29/80 (36.3%) to at least 40/80  50% Baseline: eval 29/80 (36.3%) Goal status: INITIAL  5.  Pt will be able to assume prayer position with minimal pain Baseline: unable to kneel and must  use chair Goal status: INITIAL  6.  Pt will be able to perform all household chores independently and safely to be successful at living alone in safety Baseline: Pt needing help from family members Goal status: INITIAL   PLAN:  PT FREQUENCY: 2x/week  PT DURATION: 8 weeks  PLANNED INTERVENTIONS: 97164- PT Re-evaluation, 97110-Therapeutic exercises, 97530-  Therapeutic activity, 97112- Neuromuscular re-education, 97535- Self Care, 16109- Manual therapy, [phone]- Gait training, [phone]- Electrical stimulation (unattended), [phone]- Electrical stimulation (manual), Patient/Family education, Balance training, Stair training, Taping, Joint mobilization, Cryotherapy, and Moist heat  PLAN FOR NEXT SESSION: Progress TKA , gait training on stairs, manual   Garen Lah, PT, [facility] Certified Exercise Expert for the Aging Adult  01/02/24 12:59 PM Phone: [phone] Fax: [phone]

## 2024-01-09 ENCOUNTER — Telehealth: Payer: Self-pay | Admitting: Physical Therapy

## 2024-01-09 ENCOUNTER — Ambulatory Visit: Payer: Medicare Other | Attending: Physician Assistant | Admitting: Physical Therapy

## 2024-01-09 DIAGNOSIS — M25561 Pain in right knee: Secondary | ICD-10-CM | POA: Insufficient documentation

## 2024-01-09 DIAGNOSIS — M6281 Muscle weakness (generalized): Secondary | ICD-10-CM | POA: Insufficient documentation

## 2024-01-09 DIAGNOSIS — R262 Difficulty in walking, not elsewhere classified: Secondary | ICD-10-CM | POA: Insufficient documentation

## 2024-01-09 NOTE — Telephone Encounter (Signed)
 01-09-24  Pt called, Dtr reached and reminded of cancellation policy today.  Pt/dtr could not come today at last minute and were reminded to call front office at least 24 hours ahead if possible.  Attendance policy reinforced and dtr verbalized understanding.  Dtr stated pt will come to appt on Thursday this week.   Garen Lah, PT, [facility] Certified Exercise Expert for the Aging Adult  01/09/24 10:39 AM Phone: [phone] Fax: [phone]

## 2024-01-10 NOTE — Therapy (Signed)
 OUTPATIENT PHYSICAL THERAPY LOWER EXTREMITY TREATMENT   Patient Name: Chelsea [name] MRN: [id number] DOB:11/13/1940, 83 y.o., female Today's Date: 01/11/2024  END OF SESSION:    Past Medical History:  Diagnosis Date   Arthritis    Atrial fibrillation (HCC)    Atrial fibrillation with RVR (HCC) 03/04/2015   CHF (congestive heart failure) (HCC)    Hypertension    Sleep apnea    does not use cpap   Past Surgical History:  Procedure Laterality Date   NO PAST SURGERIES     TOTAL KNEE ARTHROPLASTY Right 12/06/2023   Procedure: RIGHT TOTAL KNEE ARTHROPLASTY;  Surgeon: Tarry Kos, MD;  Location: [facility] OR;  Service: Orthopedics;  Laterality: Right;   Patient Active Problem List   Diagnosis Date Noted   Status post total right knee replacement 12/06/2023   Pitting edema 12/05/2023   Primary osteoarthritis of left knee 02/23/2021   Ankle impingement syndrome, left 02/23/2021   Primary osteoarthritis of right knee 12/03/2020   Chronic anticoagulation 03/06/2015   Edema-dopplers negative for DVT 03/06/2015   CHF exacerbation (HCC)    SOB (shortness of breath)    Chest pain    Acute on chronic diastolic congestive heart failure (HCC)    Atrial fibrillation with RVR (HCC) 03/04/2015   Essential hypertension 02/24/2015   Chronic diastolic heart failure (HCC) 12/10/2014   Permanent atrial fibrillation (HCC) 12/10/2014   Paroxysmal nocturnal dyspnea 12/10/2014    PCP: Medicine, [facility]   REFERRING PROVIDER: Cristie Hem, PA-C   REFERRING DIAG: [phone] (ICD-10-CM) - Status post total right knee replacement   THERAPY DIAG:  Acute pain of right knee  Difficulty in walking, not elsewhere classified  Muscle weakness (generalized)  Rationale for Evaluation and Treatment: Rehabilitation  ONSET DATE: 12-06-23  SUBJECTIVE:   SUBJECTIVE STATEMENT: 01-11-24 5/10 pain today. I am not using a cane at all   EVAL- Pain isnt too bad, I am getting better  every day. I hope to get back to walking. I love walking.  I use a chair for Muslim prayer. I would like to get back to the floor. I just don't want to be in pain anymore. I live alone and must be able to care for myself.  I would like to be able to walk for exercise and take care of my home. I can only stand about 10 minutes and walk for about 10-15 minutes  PERTINENT HISTORY: Gout, Afib,  HBP , HTN, R TKA 12-06-23 PAIN:  Are you having pain? Yes: NPRS scale: 4/10 present  6/10 Pain location: Right knee Pain description: spasm Aggravating factors: stairs, standing  walking of longer than 10 min, bending knee and straightening knees.  Unable to kneel Relieving factors: ice and meds  PRECAUTIONS: None  RED FLAGS: None   WEIGHT BEARING RESTRICTIONS: Yes WBAT R  FALLS:  Has patient fallen in last 6 months? No  LIVING ENVIRONMENT: Lives with: lives alone Lives in: House/apartment Stairs:  entering the house is 5 steps with rail   Has following equipment at home: Single point cane and Environmental consultant - 2 wheeled  OCCUPATION: retired  widowed  PLOF: Independent  PATIENT GOALS: get back to walking and prayer position and  care for herself alone in home  NEXT MD VISIT: TBD  OBJECTIVE:  Note: Objective measures were completed at Evaluation unless otherwise noted.  DIAGNOSTIC FINDINGS: FINDINGS: Right knee arthroplasty in expected alignment. No periprosthetic lucency or fracture. There has been patellar resurfacing. Recent postsurgical  change includes air and edema in the soft tissues and joint space.   IMPRESSION: Right knee arthroplasty without immediate postoperative complication.     Electronically Signed   By: Narda Rutherford M.D.   On: 12/06/2023 16:34  PATIENT SURVEYS:  LEFS 29/80   36.3%  COGNITION: Overall cognitive status: Within functional limits for tasks assessed     SENSATION: WFL  EDEMA:  Circumferential: Right 47 .5 cm  MUSCLE LENGTH: Hamstrings: Right  64 deg; Left 65 deg   POSTURE: rounded shoulders, forward head, and obesity  PALPATION: Pt with well healing scar from R TKA, TTP and hypersensitivity over fat pad  LOWER EXTREMITY ROM:  Active ROM Right eval Left eval Right  01-11-24  Hip flexion     Hip extension     Hip abduction     Hip adduction     Hip internal rotation     Hip external rotation     Knee flexion 108/112 P 140 110  Knee extension -12 0 -8  Ankle dorsiflexion 0 8   Ankle plantarflexion     Ankle inversion     Ankle eversion      (Blank rows = not tested) (Key: WFL = within functional limits not formally assessed, * = concordant pain, s = stiffness/stretching sensation, NT = not tested)  LOWER EXTREMITY MMT:  MMT Right eval Left eval  Hip flexion 4 4+  Hip extension 4- 4  Hip abduction 4- 4-  Hip adduction    Hip internal rotation    Hip external rotation    Knee flexion 3- 4+  Knee extension 3- 4  Ankle dorsiflexion    Ankle plantarflexion    Ankle inversion    Ankle eversion     (Blank rows = not tested)  LOWER EXTREMITY SPECIAL TESTS:  NT due to post surgery  FUNCTIONAL TESTS:  5 times sit to stand: 19:16 sec    TBD 01-11-24   791.0 ((1155 - 1620 ft) GAIT: Distance walked: Pt able to walk without AD into clinic about 150 ft Assistive device utilized: Single point cane and Walker - 2 wheeled Level of assistance: Complete Independence Comments: Pt with slight antalgic gait  no AD in clinic      [facility] Adult PT Treatment:                                                DATE: 01-11-24  791.75ft  ((1155 - 1620 ft) AROM  -8 to 110 Therapeutic Exercise: Quad set x 30 Supine Heel Slide with Strap  2 x 10 Supine Short Arc Quad   2 x 15 Hamstring Stretch with Strap   2 x 15-30 sec hold left Long Arc Quad 3 x 10 with 2 lb cuff wt  Deep squat holding onto counter  10 x 5 to 10 sec hold TKE with GTB  3 x 15   Manual Therapy: Manual for increase of extension and DF Manual for  increase of flexion Retrograde massage for edema  TREATMENT DATE: 01-02-24  Eval , issue HEP, RICE    PATIENT EDUCATION:  Education details: POC  Explanation of findings, issue HEP,  PRICE education Person educated: Patient and dtr Education method: Explanation, Demonstration, Tactile cues, Verbal cues, and Handouts Education comprehension: verbalized understanding, returned demonstration, verbal cues required, tactile cues required, and needs further education  HOME EXERCISE PROGRAM: Access Code: [email or web address] URL: [email or web address] Date: 01/02/2024 Prepared by: Garen Lah  Exercises - Supine Knee Extension Stretch on Towel Roll  - 1 x daily - 7 x weekly - 3 sets - 10 reps - 5 sec  hold - Supine Heel Slide with Strap  - 1 x daily - 7 x weekly - 3 sets - 10 reps - Supine Short Arc Quad  - 1 x daily - 7 x weekly - 3 sets - 10 reps - Hamstring Stretch with Strap  - 1 x daily - 7 x weekly - 3 sets - 3-5 reps - 15-30 sec hold - Long Arc Quad  - 1 x daily - 7 x weekly - 3 sets - 10 reps - Mini Squat  - 1 x daily - 7 x weekly - 3 sets - 10 reps Updated HEP - Standing Terminal Knee Extension with Resistance  - 1 x daily - 7 x weekly - 3 sets - 15 reps - Deep squat holding onto kitchen sink  - 1 x daily - 7 x weekly - 1 sets - 10 reps - 5-10 sec hold ASSESSMENT:  CLINICAL IMPRESSION: Pt returns with dtr for 2nd visit to reinforce HEP and maximize AROM.  Pt reviewed all exercises and performed 6 MWT  791.0 ((1155 - 1620 ft) .   Ms Moulin is very independent and needs strong direction to maximize range.  Pt is very active at home and tries to walk in the home.  She has goals of returning to her community activities.  Pt is well on her well to goal completion without designated time frame.  She is ahead of schedule on knee flexion but needs to maximize  extension.   EVAL-  Patient is a 83 y.o. female who was seen today for physical therapy evaluation and treatment for s/p R  TKA on 12/06/23 by Dr Roda Shutters  Pt presents with impairments including pain, knee weakness, impaired ROM, difficulty with walking, stairs, and with transfers . Pt would benefit from skilled PT for 2 times a week for 8 weeks to address above impariments and functional limitations and return to pain-free PLOF.  OBJECTIVE IMPAIRMENTS: decreased activity tolerance, difficulty walking, decreased ROM, decreased strength, increased edema, obesity, and pain.   ACTIVITY LIMITATIONS: carrying, bending, standing, squatting, stairs, transfers, and locomotion level  PARTICIPATION LIMITATIONS: meal prep, cleaning, laundry, shopping, community activity, and mosque attendance and ablility to assume prayer position  PERSONAL FACTORS: Gout, Afib,  HBP , HTN, R TKA 12-06-23 are also affecting patient's functional outcome.   REHAB POTENTIAL: Excellent  CLINICAL DECISION MAKING: Stable/uncomplicated  EVALUATION COMPLEXITY: Low   GOALS: Goals reviewed with patient? Yes  SHORT TERM GOALS: Target date: 03-04-24  Independent with initial HEP Baseline:no knowledge Goal status: INITIAL  2.  Report pain decrease at rest from   6/10 to   3/10. Baseline: 6/10 at worst Goal status: INITIAL  3.  Demonstrate and verbalize understanding of condition management including RICE, positioning, use of A.D., HEP.  Baseline: no knowledge Goal status: INITIAL  4.  AROM of knee extension -10 to 100 flexion to increase mobility for transitional movements  Baseline: See AROM Goal status: INITIAL  5.  Pt will begin home walking program up to 30 minutes a day minimum Baseline: unable to walk due to pain  Goal status: INITIAL    LONG TERM GOALS: Target date: 03-01-24  Pt will be independent with advanced HEP Baseline: no knowledge Goal status: INITIAL  2.  Pt will be able to negotiate steps without  exacerbation of pain greater than 1/10  Baseline: avoids steps due to pain before eval Goal status: INITIAL  3.  Pt will improve her R knee flexion to  </= 120 degrees and extension to </= 5 degrees with </= 2/10 pain for a more functional and efficient gait pattern Baseline: See AROM chart Goal status: INITIAL  4.  LEFS will improve from 29/80 (36.3%) to at least 40/80  50% Baseline: eval 29/80 (36.3%) Goal status: INITIAL  5.  Pt will be able to assume prayer position with minimal pain Baseline: unable to kneel and must use chair Goal status: INITIAL  6.  Pt will be able to perform all household chores independently and safely to be successful at living alone in safety Baseline: Pt needing help from family members Goal status: INITIAL   PLAN:  PT FREQUENCY: 2x/week  PT DURATION: 8 weeks  PLANNED INTERVENTIONS: 97164- PT Re-evaluation, 97110-Therapeutic exercises, 97530- Therapeutic activity, 97112- Neuromuscular re-education, 97535- Self Care, 16109- Manual therapy, [phone]- Gait training, [phone]- Electrical stimulation (unattended), [phone]- Electrical stimulation (manual), Patient/Family education, Balance training, Stair training, Taping, Joint mobilization, Cryotherapy, and Moist heat  PLAN FOR NEXT SESSION: Progress TKA , gait training on stairs, manual   Garen Lah, PT, [facility] Certified Exercise Expert for the Aging Adult  01/11/24 12:45 PM Phone: [phone] Fax: [phone]

## 2024-01-11 ENCOUNTER — Ambulatory Visit: Payer: Medicare Other | Admitting: Physical Therapy

## 2024-01-11 ENCOUNTER — Encounter: Payer: Self-pay | Admitting: Physical Therapy

## 2024-01-11 DIAGNOSIS — M6281 Muscle weakness (generalized): Secondary | ICD-10-CM | POA: Diagnosis present

## 2024-01-11 DIAGNOSIS — M25561 Pain in right knee: Secondary | ICD-10-CM | POA: Diagnosis present

## 2024-01-11 DIAGNOSIS — R262 Difficulty in walking, not elsewhere classified: Secondary | ICD-10-CM | POA: Diagnosis present

## 2024-01-15 ENCOUNTER — Encounter: Payer: Self-pay | Admitting: Physical Therapy

## 2024-01-15 ENCOUNTER — Ambulatory Visit: Payer: Medicare Other | Admitting: Physical Therapy

## 2024-01-15 DIAGNOSIS — M6281 Muscle weakness (generalized): Secondary | ICD-10-CM

## 2024-01-15 DIAGNOSIS — M25561 Pain in right knee: Secondary | ICD-10-CM

## 2024-01-15 DIAGNOSIS — R262 Difficulty in walking, not elsewhere classified: Secondary | ICD-10-CM

## 2024-01-15 NOTE — Therapy (Signed)
 OUTPATIENT PHYSICAL THERAPY LOWER EXTREMITY TREATMENT   Patient Name: Chelsea [name] MRN: [id number] DOB:07-29-1941, 83 y.o., female Today's Date: 01/15/2024  END OF SESSION:  PT End of Session - 01/15/24 1137     Visit Number 3    Number of Visits 16    Date for PT Re-Evaluation 03/01/24    Authorization Type MCR/ MCD    PT Start Time 1140    PT Stop Time 1230    PT Time Calculation (min) 50 min              Past Medical History:  Diagnosis Date   Arthritis    Atrial fibrillation (HCC)    Atrial fibrillation with RVR (HCC) 03/04/2015   CHF (congestive heart failure) (HCC)    Hypertension    Sleep apnea    does not use cpap   Past Surgical History:  Procedure Laterality Date   NO PAST SURGERIES     TOTAL KNEE ARTHROPLASTY Right 12/06/2023   Procedure: RIGHT TOTAL KNEE ARTHROPLASTY;  Surgeon: Tarry Kos, MD;  Location: [facility] OR;  Service: Orthopedics;  Laterality: Right;   Patient Active Problem List   Diagnosis Date Noted   Status post total right knee replacement 12/06/2023   Pitting edema 12/05/2023   Primary osteoarthritis of left knee 02/23/2021   Ankle impingement syndrome, left 02/23/2021   Primary osteoarthritis of right knee 12/03/2020   Chronic anticoagulation 03/06/2015   Edema-dopplers negative for DVT 03/06/2015   CHF exacerbation (HCC)    SOB (shortness of breath)    Chest pain    Acute on chronic diastolic congestive heart failure (HCC)    Atrial fibrillation with RVR (HCC) 03/04/2015   Essential hypertension 02/24/2015   Chronic diastolic heart failure (HCC) 12/10/2014   Permanent atrial fibrillation (HCC) 12/10/2014   Paroxysmal nocturnal dyspnea 12/10/2014    PCP: Medicine, [facility]   REFERRING PROVIDER: Cristie Hem, PA-C   REFERRING DIAG: [phone] (ICD-10-CM) - Status post total right knee replacement   THERAPY DIAG:  Acute pain of right knee  Difficulty in walking, not elsewhere  classified  Muscle weakness (generalized)  Rationale for Evaluation and Treatment: Rehabilitation  ONSET DATE: 12-06-23  SUBJECTIVE:   SUBJECTIVE STATEMENT: I have a little pain. It has been hurting the last 2 days.    EVAL- Pain isnt too bad, I am getting better every day. I hope to get back to walking. I love walking.  I use a chair for Muslim prayer. I would like to get back to the floor. I just don't want to be in pain anymore. I live alone and must be able to care for myself.  I would like to be able to walk for exercise and take care of my home. I can only stand about 10 minutes and walk for about 10-15 minutes  PERTINENT HISTORY: Gout, Afib,  HBP , HTN, R TKA 12-06-23 PAIN:  Are you having pain? Yes: NPRS scale: 4/10 present  6/10 Pain location: Right knee Pain description: spasm Aggravating factors: stairs, standing  walking of longer than 10 min, bending knee and straightening knees.  Unable to kneel Relieving factors: ice and meds  PRECAUTIONS: None  RED FLAGS: None   WEIGHT BEARING RESTRICTIONS: Yes WBAT R  FALLS:  Has patient fallen in last 6 months? No  LIVING ENVIRONMENT: Lives with: lives alone Lives in: House/apartment Stairs:  entering the house is 5 steps with rail   Has following equipment at home: Single point  cane and Walker - 2 wheeled  OCCUPATION: retired  widowed  PLOF: Independent  PATIENT GOALS: get back to walking and prayer position and  care for herself alone in home  NEXT MD VISIT: TBD  OBJECTIVE:  Note: Objective measures were completed at Evaluation unless otherwise noted.  DIAGNOSTIC FINDINGS: FINDINGS: Right knee arthroplasty in expected alignment. No periprosthetic lucency or fracture. There has been patellar resurfacing. Recent postsurgical change includes air and edema in the soft tissues and joint space.   IMPRESSION: Right knee arthroplasty without immediate postoperative complication.     Electronically Signed   By:  Narda Rutherford M.D.   On: 12/06/2023 16:34  PATIENT SURVEYS:  LEFS 29/80   36.3%  COGNITION: Overall cognitive status: Within functional limits for tasks assessed     SENSATION: WFL  EDEMA:  Circumferential: Right 47 .5 cm  MUSCLE LENGTH: Hamstrings: Right 64 deg; Left 65 deg   POSTURE: rounded shoulders, forward head, and obesity  PALPATION: Pt with well healing scar from R TKA, TTP and hypersensitivity over fat pad  LOWER EXTREMITY ROM:  Active ROM Right eval Left eval Right  01-11-24 Right  01/15/24  Hip flexion      Hip extension      Hip abduction      Hip adduction      Hip internal rotation      Hip external rotation      Knee flexion 108/112 P 140 110 110 AA  Knee extension -12 0 -8   Ankle dorsiflexion 0 8    Ankle plantarflexion      Ankle inversion      Ankle eversion       (Blank rows = not tested) (Key: WFL = within functional limits not formally assessed, * = concordant pain, s = stiffness/stretching sensation, NT = not tested)  LOWER EXTREMITY MMT:  MMT Right eval Left eval  Hip flexion 4 4+  Hip extension 4- 4  Hip abduction 4- 4-  Hip adduction    Hip internal rotation    Hip external rotation    Knee flexion 3- 4+  Knee extension 3- 4  Ankle dorsiflexion    Ankle plantarflexion    Ankle inversion    Ankle eversion     (Blank rows = not tested)  LOWER EXTREMITY SPECIAL TESTS:  NT due to post surgery  FUNCTIONAL TESTS:  5 times sit to stand: 19:16 sec  01-11-24   791.0 ((1155 - 1620 ft) GAIT: Distance walked: Pt able to walk without AD into clinic about 150 ft Assistive device utilized: Single point cane and Walker - 2 wheeled Level of assistance: Complete Independence Comments: Pt with slight antalgic gait  no AD in clinic     [facility] Adult PT Treatment:                                                DATE: 01/15/24 Therapeutic Exercise: Supine hamstring stretch with strap SAQ 10 x 2  Heel prop with Quad set 10 x 2   Supine heel slides with strap  Step stretch for flexion and extension Slant board stretch  Therapeutic Activity: 4 inch step up 10 x 2 , UE assist Squats with UE assist x 10 Rec bike Full revolutions forward and back  Modalities: Ice pack post session x 10 minutes  Self Care: LE  elevation above heart level to reduce edema , pt and daughter interested in bolster for home.      [facility] Adult PT Treatment:                                                DATE: 01-11-24  791.71ft  ((1155 - 1620 ft) AROM  -8 to 110 Therapeutic Exercise: Quad set x 30 Supine Heel Slide with Strap  2 x 10 Supine Short Arc Quad   2 x 15 Hamstring Stretch with Strap   2 x 15-30 sec hold left Long Arc Quad 3 x 10 with 2 lb cuff wt  Deep squat holding onto counter  10 x 5 to 10 sec hold TKE with GTB  3 x 15   Manual Therapy: Manual for increase of extension and DF Manual for increase of flexion Retrograde massage for edema                                                                                                                           TREATMENT DATE: 01-02-24  Eval , issue HEP, RICE    PATIENT EDUCATION:  Education details: POC  Explanation of findings, issue HEP,  PRICE education Person educated: Patient and dtr Education method: Explanation, Demonstration, Tactile cues, Verbal cues, and Handouts Education comprehension: verbalized understanding, returned demonstration, verbal cues required, tactile cues required, and needs further education  HOME EXERCISE PROGRAM: Access Code: [email or web address] URL: [email or web address] Date: 01/02/2024 Prepared by: Garen Lah  Exercises - Supine Knee Extension Stretch on Towel Roll  - 1 x daily - 7 x weekly - 3 sets - 10 reps - 5 sec  hold - Supine Heel Slide with Strap  - 1 x daily - 7 x weekly - 3 sets - 10 reps - Supine Short Arc Quad  - 1 x daily - 7 x weekly - 3 sets - 10 reps - Hamstring Stretch with Strap  - 1 x daily - 7 x weekly -  3 sets - 3-5 reps - 15-30 sec hold - Long Arc Quad  - 1 x daily - 7 x weekly - 3 sets - 10 reps - Mini Squat  - 1 x daily - 7 x weekly - 3 sets - 10 reps Updated HEP - Standing Terminal Knee Extension with Resistance  - 1 x daily - 7 x weekly - 3 sets - 15 reps - Deep squat holding onto kitchen sink  - 1 x daily - 7 x weekly - 1 sets - 10 reps - 5-10 sec hold ASSESSMENT:  CLINICAL IMPRESSION: Pt returns with dtr for 3rd treatment. Continued working on AROM. Pt able to complete full revolutions on Rec bike and began small step up without difficulty. She does report increased pain over the last 2 days and demonstrates increased right LE edema  compared to left. Encouraged more frequent rest with elevation. She is very active during the day and stays busy.  Pt and daughter educated on above heart level elevation. Pt's daughter placed an order for a bolster to be delivered to her home this week. Pt reviewed all exercises and pt has maintained ROM since last visit.     EVAL-  Patient is a 83 y.o. female who was seen today for physical therapy evaluation and treatment for s/p R  TKA on 12/06/23 by Dr Roda Shutters  Pt presents with impairments including pain, knee weakness, impaired ROM, difficulty with walking, stairs, and with transfers . Pt would benefit from skilled PT for 2 times a week for 8 weeks to address above impariments and functional limitations and return to pain-free PLOF.  OBJECTIVE IMPAIRMENTS: decreased activity tolerance, difficulty walking, decreased ROM, decreased strength, increased edema, obesity, and pain.   ACTIVITY LIMITATIONS: carrying, bending, standing, squatting, stairs, transfers, and locomotion level  PARTICIPATION LIMITATIONS: meal prep, cleaning, laundry, shopping, community activity, and mosque attendance and ablility to assume prayer position  PERSONAL FACTORS: Gout, Afib,  HBP , HTN, R TKA 12-06-23 are also affecting patient's functional outcome.   REHAB POTENTIAL:  Excellent  CLINICAL DECISION MAKING: Stable/uncomplicated  EVALUATION COMPLEXITY: Low   GOALS: Goals reviewed with patient? Yes  SHORT TERM GOALS: Target date: 03-04-24  Independent with initial HEP Baseline:no knowledge Goal status: INITIAL  2.  Report pain decrease at rest from   6/10 to   3/10. Baseline: 6/10 at worst Goal status: INITIAL  3.  Demonstrate and verbalize understanding of condition management including RICE, positioning, use of A.D., HEP.  Baseline: no knowledge Goal status: INITIAL  4.  AROM of knee extension -10 to 100 flexion to increase mobility for transitional movements Baseline: See AROM Goal status: INITIAL  5.  Pt will begin home walking program up to 30 minutes a day minimum Baseline: unable to walk due to pain  Goal status: INITIAL    LONG TERM GOALS: Target date: 03-01-24  Pt will be independent with advanced HEP Baseline: no knowledge Goal status: INITIAL  2.  Pt will be able to negotiate steps without exacerbation of pain greater than 1/10  Baseline: avoids steps due to pain before eval Goal status: INITIAL  3.  Pt will improve her R knee flexion to  </= 120 degrees and extension to </= 5 degrees with </= 2/10 pain for a more functional and efficient gait pattern Baseline: See AROM chart Goal status: INITIAL  4.  LEFS will improve from 29/80 (36.3%) to at least 40/80  50% Baseline: eval 29/80 (36.3%) Goal status: INITIAL  5.  Pt will be able to assume prayer position with minimal pain Baseline: unable to kneel and must use chair Goal status: INITIAL  6.  Pt will be able to perform all household chores independently and safely to be successful at living alone in safety Baseline: Pt needing help from family members Goal status: INITIAL   PLAN:  PT FREQUENCY: 2x/week  PT DURATION: 8 weeks  PLANNED INTERVENTIONS: 97164- PT Re-evaluation, 97110-Therapeutic exercises, 97530- Therapeutic activity, 97112- Neuromuscular  re-education, 97535- Self Care, [id number]- Manual therapy, [phone]- Gait training, [phone]- Electrical stimulation (unattended), [phone]- Electrical stimulation (manual), Patient/Family education, Balance training, Stair training, Taping, Joint mobilization, Cryotherapy, and Moist heat  PLAN FOR NEXT SESSION: Progress TKA , gait training on stairs, manual   Jannette Spanner, PTA 01/15/24 12:47 PM Phone: [phone] Fax: [phone]

## 2024-01-17 ENCOUNTER — Encounter: Payer: Self-pay | Admitting: Physical Therapy

## 2024-01-17 ENCOUNTER — Ambulatory Visit: Payer: Medicare Other | Admitting: Physical Therapy

## 2024-01-17 DIAGNOSIS — R262 Difficulty in walking, not elsewhere classified: Secondary | ICD-10-CM

## 2024-01-17 DIAGNOSIS — M6281 Muscle weakness (generalized): Secondary | ICD-10-CM

## 2024-01-17 DIAGNOSIS — M25561 Pain in right knee: Secondary | ICD-10-CM

## 2024-01-17 NOTE — Therapy (Signed)
 OUTPATIENT PHYSICAL THERAPY LOWER EXTREMITY TREATMENT   Patient Name: Chelsea [name] MRN: [id number] DOB:Sep 23, 1941, 83 y.o., female Today's Date: 01/17/2024  END OF SESSION:  PT End of Session - 01/17/24 1149     Visit Number 4    Number of Visits 16    Date for PT Re-Evaluation 03/01/24    Authorization Type MCR/ MCD    PT Start Time 1146    PT Stop Time 1225    PT Time Calculation (min) 39 min              Past Medical History:  Diagnosis Date   Arthritis    Atrial fibrillation (HCC)    Atrial fibrillation with RVR (HCC) 03/04/2015   CHF (congestive heart failure) (HCC)    Hypertension    Sleep apnea    does not use cpap   Past Surgical History:  Procedure Laterality Date   NO PAST SURGERIES     TOTAL KNEE ARTHROPLASTY Right 12/06/2023   Procedure: RIGHT TOTAL KNEE ARTHROPLASTY;  Surgeon: Tarry Kos, MD;  Location: [facility] OR;  Service: Orthopedics;  Laterality: Right;   Patient Active Problem List   Diagnosis Date Noted   Status post total right knee replacement 12/06/2023   Pitting edema 12/05/2023   Primary osteoarthritis of left knee 02/23/2021   Ankle impingement syndrome, left 02/23/2021   Primary osteoarthritis of right knee 12/03/2020   Chronic anticoagulation 03/06/2015   Edema-dopplers negative for DVT 03/06/2015   CHF exacerbation (HCC)    SOB (shortness of breath)    Chest pain    Acute on chronic diastolic congestive heart failure (HCC)    Atrial fibrillation with RVR (HCC) 03/04/2015   Essential hypertension 02/24/2015   Chronic diastolic heart failure (HCC) 12/10/2014   Permanent atrial fibrillation (HCC) 12/10/2014   Paroxysmal nocturnal dyspnea 12/10/2014    PCP: Medicine, [facility]   REFERRING PROVIDER: Cristie Hem, PA-C   REFERRING DIAG: [phone] (ICD-10-CM) - Status post total right knee replacement   THERAPY DIAG:  Acute pain of right knee  Difficulty in walking, not elsewhere  classified  Muscle weakness (generalized)  Rationale for Evaluation and Treatment: Rehabilitation  ONSET DATE: 12-06-23  SUBJECTIVE:   SUBJECTIVE STATEMENT: The knee is hurting more. A lot of pain on the front. See MD tomorrow for follow up.    EVAL- Pain isnt too bad, I am getting better every day. I hope to get back to walking. I love walking.  I use a chair for Muslim prayer. I would like to get back to the floor. I just don't want to be in pain anymore. I live alone and must be able to care for myself.  I would like to be able to walk for exercise and take care of my home. I can only stand about 10 minutes and walk for about 10-15 minutes  PERTINENT HISTORY: Gout, Afib,  HBP , HTN, R TKA 12-06-23 PAIN:  Are you having pain? Yes: NPRS scale: 4/10 present  6/10 Pain location: Right knee Pain description: spasm Aggravating factors: stairs, standing  walking of longer than 10 min, bending knee and straightening knees.  Unable to kneel Relieving factors: ice and meds  PRECAUTIONS: None  RED FLAGS: None   WEIGHT BEARING RESTRICTIONS: Yes WBAT R  FALLS:  Has patient fallen in last 6 months? No  LIVING ENVIRONMENT: Lives with: lives alone Lives in: House/apartment Stairs:  entering the house is 5 steps with rail   Has following  equipment at home: Single point cane and Walker - 2 wheeled  OCCUPATION: retired  widowed  PLOF: Independent  PATIENT GOALS: get back to walking and prayer position and  care for herself alone in home  NEXT MD VISIT: TBD  OBJECTIVE:  Note: Objective measures were completed at Evaluation unless otherwise noted.  DIAGNOSTIC FINDINGS: FINDINGS: Right knee arthroplasty in expected alignment. No periprosthetic lucency or fracture. There has been patellar resurfacing. Recent postsurgical change includes air and edema in the soft tissues and joint space.   IMPRESSION: Right knee arthroplasty without immediate postoperative complication.      Electronically Signed   By: Narda Rutherford M.D.   On: 12/06/2023 16:34  PATIENT SURVEYS:  LEFS 29/80   36.3%  COGNITION: Overall cognitive status: Within functional limits for tasks assessed     SENSATION: WFL  EDEMA:  Circumferential: Right 47 .5 cm  MUSCLE LENGTH: Hamstrings: Right 64 deg; Left 65 deg   POSTURE: rounded shoulders, forward head, and obesity  PALPATION: Pt with well healing scar from R TKA, TTP and hypersensitivity over fat pad  LOWER EXTREMITY ROM:  Active ROM Right eval Left eval Right  01-11-24 Right  01/15/24  Right 01/17/24  Hip flexion       Hip extension       Hip abduction       Hip adduction       Hip internal rotation       Hip external rotation       Knee flexion 108/112 P 140 110 110 AA 120 AA  Knee extension -12 0 -8    Ankle dorsiflexion 0 8     Ankle plantarflexion       Ankle inversion       Ankle eversion        (Blank rows = not tested) (Key: WFL = within functional limits not formally assessed, * = concordant pain, s = stiffness/stretching sensation, NT = not tested)  LOWER EXTREMITY MMT:  MMT Right eval Left eval  Hip flexion 4 4+  Hip extension 4- 4  Hip abduction 4- 4-  Hip adduction    Hip internal rotation    Hip external rotation    Knee flexion 3- 4+  Knee extension 3- 4  Ankle dorsiflexion    Ankle plantarflexion    Ankle inversion    Ankle eversion     (Blank rows = not tested)  LOWER EXTREMITY SPECIAL TESTS:  NT due to post surgery  FUNCTIONAL TESTS:  5 times sit to stand: 19:16 sec  01-11-24   791.0 ((1155 - 1620 ft) GAIT: Distance walked: Pt able to walk without AD into clinic about 150 ft Assistive device utilized: Single point cane and Walker - 2 wheeled Level of assistance: Complete Independence Comments: Pt with slight antalgic gait  no AD in clinic   [facility] Adult PT Treatment:                                                DATE: 01/17/24 Therapeutic Exercise: Supine SAQ 10 x 2  LAQ  10 x 2  SLR 10 x 1 QS 10 x 2  Manual Therapy: Edema massage with legs elevated  Seated knee joint oscillations to decrease pain  Knee extension Over pressure  Modalities: Vaso for edema  trial at coldest setting and medium pressure. Pt  only tolerated for 5 minutes.       [facility] Adult PT Treatment:                                                DATE: 01/15/24 Therapeutic Exercise: Supine hamstring stretch with strap SAQ 10 x 2  Heel prop with Quad set 10 x 2  Supine heel slides with strap  Step stretch for flexion and extension Slant board stretch  Therapeutic Activity: 4 inch step up 10 x 2 , UE assist Squats with UE assist x 10 Rec bike Full revolutions forward and back  Modalities: Ice pack post session x 10 minutes  Self Care: LE elevation above heart level to reduce edema , pt and daughter interested in bolster for home.      [facility] Adult PT Treatment:                                                DATE: 01-11-24  791.83ft  ((1155 - 1620 ft) AROM  -8 to 110 Therapeutic Exercise: Quad set x 30 Supine Heel Slide with Strap  2 x 10 Supine Short Arc Quad   2 x 15 Hamstring Stretch with Strap   2 x 15-30 sec hold left Long Arc Quad 3 x 10 with 2 lb cuff wt  Deep squat holding onto counter  10 x 5 to 10 sec hold TKE with GTB  3 x 15   Manual Therapy: Manual for increase of extension and DF Manual for increase of flexion Retrograde massage for edema                                                                                                                           TREATMENT DATE: 01-02-24  Eval , issue HEP, RICE    PATIENT EDUCATION:  Education details: POC  Explanation of findings, issue HEP,  PRICE education Person educated: Patient and dtr Education method: Explanation, Demonstration, Tactile cues, Verbal cues, and Handouts Education comprehension: verbalized understanding, returned demonstration, verbal cues required, tactile cues required, and needs further  education  HOME EXERCISE PROGRAM: Access Code: [email or web address] URL: [email or web address] Date: 01/02/2024 Prepared by: Garen Lah  Exercises - Supine Knee Extension Stretch on Towel Roll  - 1 x daily - 7 x weekly - 3 sets - 10 reps - 5 sec  hold - Supine Heel Slide with Strap  - 1 x daily - 7 x weekly - 3 sets - 10 reps - Supine Short Arc Quad  - 1 x daily - 7 x weekly - 3 sets - 10 reps - Hamstring Stretch with Strap  - 1 x daily - 7 x weekly - 3 sets - 3-5  reps - 15-30 sec hold - Long Arc Quad  - 1 x daily - 7 x weekly - 3 sets - 10 reps - Mini Squat  - 1 x daily - 7 x weekly - 3 sets - 10 reps Updated HEP - Standing Terminal Knee Extension with Resistance  - 1 x daily - 7 x weekly - 3 sets - 15 reps - Deep squat holding onto kitchen sink  - 1 x daily - 7 x weekly - 1 sets - 10 reps - 5-10 sec hold ASSESSMENT:  CLINICAL IMPRESSION: Pt returns with dtr for 4th treatment. She continues to c/o significant knee pain. She will see MD tomorrow for follow up. She does have continued edema in LE despite ice and elevation 3 x per day. She did gain another 10 degrees of flexion, reaching 120 degrees today and lacks minimal extension. Performed gentle knee mobilizations to decrease pain. Performed edema massage to decrease swelling followed by trial of Vaso for compression. She was unable to tolerate >5 minutes. Will see what MD says after tomorrow's F/U.      EVAL-  Patient is a 83 y.o. female who was seen today for physical therapy evaluation and treatment for s/p R  TKA on 12/06/23 by Dr Roda Shutters  Pt presents with impairments including pain, knee weakness, impaired ROM, difficulty with walking, stairs, and with transfers . Pt would benefit from skilled PT for 2 times a week for 8 weeks to address above impariments and functional limitations and return to pain-free PLOF.  OBJECTIVE IMPAIRMENTS: decreased activity tolerance, difficulty walking, decreased ROM, decreased strength, increased  edema, obesity, and pain.   ACTIVITY LIMITATIONS: carrying, bending, standing, squatting, stairs, transfers, and locomotion level  PARTICIPATION LIMITATIONS: meal prep, cleaning, laundry, shopping, community activity, and mosque attendance and ablility to assume prayer position  PERSONAL FACTORS: Gout, Afib,  HBP , HTN, R TKA 12-06-23 are also affecting patient's functional outcome.   REHAB POTENTIAL: Excellent  CLINICAL DECISION MAKING: Stable/uncomplicated  EVALUATION COMPLEXITY: Low   GOALS: Goals reviewed with patient? Yes  SHORT TERM GOALS: Target date: 03-04-24  Independent with initial HEP Baseline:no knowledge Goal status: INITIAL  2.  Report pain decrease at rest from   6/10 to   3/10. Baseline: 6/10 at worst Goal status: INITIAL  3.  Demonstrate and verbalize understanding of condition management including RICE, positioning, use of A.D., HEP.  Baseline: no knowledge Goal status: INITIAL  4.  AROM of knee extension -10 to 100 flexion to increase mobility for transitional movements Baseline: See AROM Goal status: INITIAL  5.  Pt will begin home walking program up to 30 minutes a day minimum Baseline: unable to walk due to pain  Goal status: INITIAL    LONG TERM GOALS: Target date: 03-01-24  Pt will be independent with advanced HEP Baseline: no knowledge Goal status: INITIAL  2.  Pt will be able to negotiate steps without exacerbation of pain greater than 1/10  Baseline: avoids steps due to pain before eval Goal status: INITIAL  3.  Pt will improve her R knee flexion to  </= 120 degrees and extension to </= 5 degrees with </= 2/10 pain for a more functional and efficient gait pattern Baseline: See AROM chart Goal status: INITIAL  4.  LEFS will improve from 29/80 (36.3%) to at least 40/80  50% Baseline: eval 29/80 (36.3%) Goal status: INITIAL  5.  Pt will be able to assume prayer position with minimal pain Baseline: unable to kneel and must  use  chair Goal status: INITIAL  6.  Pt will be able to perform all household chores independently and safely to be successful at living alone in safety Baseline: Pt needing help from family members Goal status: INITIAL   PLAN:  PT FREQUENCY: 2x/week  PT DURATION: 8 weeks  PLANNED INTERVENTIONS: 97164- PT Re-evaluation, 97110-Therapeutic exercises, 97530- Therapeutic activity, 97112- Neuromuscular re-education, 97535- Self Care, 13086- Manual therapy, [phone]- Gait training, [phone]- Electrical stimulation (unattended), [phone]- Electrical stimulation (manual), Patient/Family education, Balance training, Stair training, Taping, Joint mobilization, Cryotherapy, and Moist heat  PLAN FOR NEXT SESSION: Progress TKA , gait training on stairs, manual   Jannette Spanner, PTA 01/17/24 12:33 PM Phone: [phone] Fax: [phone]

## 2024-01-18 ENCOUNTER — Encounter: Payer: Medicare Other | Admitting: Physician Assistant

## 2024-01-19 ENCOUNTER — Ambulatory Visit: Admitting: Physician Assistant

## 2024-01-19 ENCOUNTER — Other Ambulatory Visit (INDEPENDENT_AMBULATORY_CARE_PROVIDER_SITE_OTHER): Payer: Self-pay

## 2024-01-19 DIAGNOSIS — Z96651 Presence of right artificial knee joint: Secondary | ICD-10-CM

## 2024-01-19 DIAGNOSIS — M79661 Pain in right lower leg: Secondary | ICD-10-CM

## 2024-01-19 MED ORDER — METHOCARBAMOL 750 MG PO TABS: 750.0000 mg | ORAL_TABLET | Freq: Three times a day (TID) | ORAL | 2 refills | Status: DC | PRN

## 2024-01-19 MED ORDER — HYDROCODONE-ACETAMINOPHEN 5-325 MG PO TABS: 1.0000 | ORAL_TABLET | Freq: Three times a day (TID) | ORAL | 0 refills | Status: DC | PRN

## 2024-01-19 NOTE — Progress Notes (Signed)
 Post-Op Visit Note   Patient: Chelsea [name]           Date of Birth: 1941-10-10           MRN: [id number] Visit Date: 01/19/2024 PCP: Medicine, [facility]   Assessment & Plan:  Chief Complaint:  Chief Complaint  Patient presents with   Right Knee - Follow-up    Right total knee arthroplasty 12/06/2023   Visit Diagnoses:  1. Status post total right knee replacement   2. Pain in right lower leg     Plan: Patient is a pleasant 83 year old female who comes in today 6 weeks status post right total knee replacement.  She has been in a fair amount of pain over the past few days as she has been out of pain medicine and is only been taking Tylenol.  She is currently in outpatient physical therapy and is also working on a home exercise program.  Ambulating without assistance.  She is on Eliquis for A-fib.  Examination of the right knee reveals a fully healed surgical scar without complication.  Range of motion 0 to 120 degrees.  Stable to valgus varus stress.  She does have a fair amount of swelling to the right lower extremity.  Calf is soft but relatively tender.  Negative Homans.  She is neurovascularly intact distally.  Today, I have refilled her narcotic pain medicine as well as her muscle relaxers.  She will continue on her Eliquis that she is on chronically.  Continue with PT.  Dental prophylaxis reinforced.  I also provided her with a prescription for TED hose.  Will order an ultrasound of the right lower extremity to rule out DVT.  Follow-up in 6 weeks for recheck.  Call with concerns or questions.  Follow-Up Instructions: Return in about 6 weeks (around 03/01/2024).   Orders:  Orders Placed This Encounter  Procedures   XR Knee 1-2 Views Right   VAS [place] LOWER EXTREMITY VENOUS (DVT)   No orders of the defined types were placed in this encounter.   Imaging: XR Knee 1-2 Views Right Result Date: 01/19/2024 Well-seated prosthesis without complication   PMFS  History: Patient Active Problem List   Diagnosis Date Noted   Status post total right knee replacement 12/06/2023   Pitting edema 12/05/2023   Primary osteoarthritis of left knee 02/23/2021   Ankle impingement syndrome, left 02/23/2021   Primary osteoarthritis of right knee 12/03/2020   Chronic anticoagulation 03/06/2015   Edema-dopplers negative for DVT 03/06/2015   CHF exacerbation (HCC)    SOB (shortness of breath)    Chest pain    Acute on chronic diastolic congestive heart failure (HCC)    Atrial fibrillation with RVR (HCC) 03/04/2015   Essential hypertension 02/24/2015   Chronic diastolic heart failure (HCC) 12/10/2014   Permanent atrial fibrillation (HCC) 12/10/2014   Paroxysmal nocturnal dyspnea 12/10/2014   Past Medical History:  Diagnosis Date   Arthritis    Atrial fibrillation (HCC)    Atrial fibrillation with RVR (HCC) 03/04/2015   CHF (congestive heart failure) (HCC)    Hypertension    Sleep apnea    does not use cpap    Family History  Problem Relation Age of Onset   Heart failure Father     Past Surgical History:  Procedure Laterality Date   NO PAST SURGERIES     TOTAL KNEE ARTHROPLASTY Right 12/06/2023   Procedure: RIGHT TOTAL KNEE ARTHROPLASTY;  Surgeon: Tarry Kos, MD;  Location: [facility]  OR;  Service: Orthopedics;  Laterality: Right;   Social History   Occupational History   Not on file  Tobacco Use   Smoking status: Never   Smokeless tobacco: Never  Vaping Use   Vaping status: Never Used  Substance and Sexual Activity   Alcohol use: No   Drug use: No   Sexual activity: Never

## 2024-01-22 ENCOUNTER — Ambulatory Visit (HOSPITAL_COMMUNITY): Attending: Physician Assistant

## 2024-01-23 ENCOUNTER — Encounter: Payer: Self-pay | Admitting: Physical Therapy

## 2024-01-23 ENCOUNTER — Ambulatory Visit: Payer: Medicare Other | Admitting: Physical Therapy

## 2024-01-23 DIAGNOSIS — M25561 Pain in right knee: Secondary | ICD-10-CM

## 2024-01-23 DIAGNOSIS — M6281 Muscle weakness (generalized): Secondary | ICD-10-CM

## 2024-01-23 DIAGNOSIS — R262 Difficulty in walking, not elsewhere classified: Secondary | ICD-10-CM

## 2024-01-23 NOTE — Therapy (Signed)
 OUTPATIENT PHYSICAL THERAPY LOWER EXTREMITY TREATMENT   Patient Name: Chelsea [name] MRN: [id number] DOB:[birth year], 83 y.o., female Today's Date: 01/23/2024  END OF SESSION:  PT End of Session - 01/23/24 1147     Visit Number 5    Number of Visits 16    Date for PT Re-Evaluation 03/01/24    Authorization Type MCR/ MCD    PT Start Time 1147    PT Stop Time 1230    PT Time Calculation (min) 43 min               Past Medical History:  Diagnosis Date   Arthritis    Atrial fibrillation (HCC)    Atrial fibrillation with RVR (HCC) 03/04/2015   CHF (congestive heart failure) (HCC)    Hypertension    Sleep apnea    does not use cpap   Past Surgical History:  Procedure Laterality Date   NO PAST SURGERIES     TOTAL KNEE ARTHROPLASTY Right 12/06/2023   Procedure: RIGHT TOTAL KNEE ARTHROPLASTY;  Surgeon: Tarry Kos, MD;  Location: [facility] OR;  Service: Orthopedics;  Laterality: Right;   Patient Active Problem List   Diagnosis Date Noted   Status post total right knee replacement 12/06/2023   Pitting edema 12/05/2023   Primary osteoarthritis of left knee 02/23/2021   Ankle impingement syndrome, left 02/23/2021   Primary osteoarthritis of right knee 12/03/2020   Chronic anticoagulation 03/06/2015   Edema-dopplers negative for DVT 03/06/2015   CHF exacerbation (HCC)    SOB (shortness of breath)    Chest pain    Acute on chronic diastolic congestive heart failure (HCC)    Atrial fibrillation with RVR (HCC) 03/04/2015   Essential hypertension 02/24/2015   Chronic diastolic heart failure (HCC) 12/10/2014   Permanent atrial fibrillation (HCC) 12/10/2014   Paroxysmal nocturnal dyspnea 12/10/2014    PCP: Medicine, [facility]   REFERRING PROVIDER: Cristie Hem, PA-C   REFERRING DIAG: [id number] (ICD-10-CM) - Status post total right knee replacement   THERAPY DIAG:  Acute pain of right knee  Difficulty in walking, not elsewhere  classified  Muscle weakness (generalized)  Rationale for Evaluation and Treatment: Rehabilitation  ONSET DATE: 12-06-23  SUBJECTIVE:   SUBJECTIVE STATEMENT: My pain 5/10 pain today.  I have been doing my exercise.  Today is better   EVAL- Pain isnt too bad, I am getting better every day. I hope to get back to walking. I love walking.  I use a chair for Muslim prayer. I would like to get back to the floor. I just don't want to be in pain anymore. I live alone and must be able to care for myself.  I would like to be able to walk for exercise and take care of my home. I can only stand about 10 minutes and walk for about 10-15 minutes  PERTINENT HISTORY: Gout, Afib,  HBP , HTN, R TKA 12-06-23 PAIN:  Are you having pain? Yes: NPRS scale: 4/10 present  6/10 Pain location: Right knee Pain description: spasm Aggravating factors: stairs, standing  walking of longer than 10 min, bending knee and straightening knees.  Unable to kneel Relieving factors: ice and meds  PRECAUTIONS: None  RED FLAGS: None   WEIGHT BEARING RESTRICTIONS: Yes WBAT R  FALLS:  Has patient fallen in last 6 months? No  LIVING ENVIRONMENT: Lives with: lives alone Lives in: House/apartment Stairs:  entering the house is 5 steps with rail   Has following equipment at  home: Single point cane and Walker - 2 wheeled  OCCUPATION: retired  widowed  PLOF: Independent  PATIENT GOALS: get back to walking and prayer position and  care for herself alone in home  NEXT MD VISIT: TBD  OBJECTIVE:  Note: Objective measures were completed at Evaluation unless otherwise noted.  DIAGNOSTIC FINDINGS: FINDINGS: Right knee arthroplasty in expected alignment. No periprosthetic lucency or fracture. There has been patellar resurfacing. Recent postsurgical change includes air and edema in the soft tissues and joint space.   IMPRESSION: Right knee arthroplasty without immediate postoperative complication.     Electronically  Signed   By: Narda Rutherford M.D.   On: 12/06/2023 16:34  PATIENT SURVEYS:  LEFS 29/80   36.3%  COGNITION: Overall cognitive status: Within functional limits for tasks assessed     SENSATION: WFL  EDEMA:  Circumferential: Right 47 .5 cm  MUSCLE LENGTH: Hamstrings: Right 64 deg; Left 65 deg   POSTURE: rounded shoulders, forward head, and obesity  PALPATION: Pt with well healing scar from R TKA, TTP and hypersensitivity over fat pad  LOWER EXTREMITY ROM:  Active ROM Right eval Left eval Right  01-11-24 Right  01/15/24  Right 01/17/24 Right 01-23-23  Hip flexion        Hip extension        Hip abduction        Hip adduction        Hip internal rotation        Hip external rotation        Knee flexion 108/112 P 140 110 110 AA 120 AA 116 AA 120  Knee extension -12 0 -8   -5  Ankle dorsiflexion 0 8      Ankle plantarflexion        Ankle inversion        Ankle eversion         (Blank rows = not tested) (Key: WFL = within functional limits not formally assessed, * = concordant pain, s = stiffness/stretching sensation, NT = not tested)  LOWER EXTREMITY MMT:  MMT Right eval Left eval  Hip flexion 4 4+  Hip extension 4- 4  Hip abduction 4- 4-  Hip adduction    Hip internal rotation    Hip external rotation    Knee flexion 3- 4+  Knee extension 3- 4  Ankle dorsiflexion    Ankle plantarflexion    Ankle inversion    Ankle eversion     (Blank rows = not tested)  LOWER EXTREMITY SPECIAL TESTS:  NT due to post surgery  FUNCTIONAL TESTS:  5 times sit to stand: 19:16 sec  01-11-24   791.0 ((1155 - 1620 ft) 01-23-24  819.21ft GAIT: Distance walked: Pt able to walk without AD into clinic about 150 ft Assistive device utilized: Single point cane and Walker - 2 wheeled Level of assistance: Complete Independence Comments: Pt with slight antalgic gait  no AD in clinic   [facility] Adult PT Treatment:                                                DATE: 01-23-24 AROM   -5 to 116  AA 120 Therapeutic Exercise: Recumbent bike 5 min Step ups on 6 inch 2 x 10 on R and L with 3# Cuff weights Side step ups on 6 inch step  2  x 10 on R and L with 3# Cuff weights Hip abduction on 6 inch step  2 x 10 on R and L with 3# Cuff weights Hip extension on 6 inch step  2 x 10 on R and L with 3# Cuff weights TKE with ball on wall  1 x 15 on Right LAQ 10 x with 3 Lb cuff weights SAQ 10 x with 3 Lb cuff weights Manual Therapy: Edema massage with legs elevated  Scar tissue massage Knee extension Over pressure  Therapeutic Activity:   819.0 ((1155 - 1620 ft) Stepping over with high march  [facility] Adult PT Treatment:                                                DATE: 01/17/24 Therapeutic Exercise: Supine SAQ 10 x 2  LAQ 10 x 2  SLR 10 x 1 QS 10 x 2  Manual Therapy: Edema massage with legs elevated  Seated knee joint oscillations to decrease pain  Knee extension Over pressure  Modalities: Vaso for edema  trial at coldest setting and medium pressure. Pt only tolerated for 5 minutes.       [facility] Adult PT Treatment:                                                DATE: 01/15/24 Therapeutic Exercise: Supine hamstring stretch with strap SAQ 10 x 2  Heel prop with Quad set 10 x 2  Supine heel slides with strap  Step stretch for flexion and extension Slant board stretch  Therapeutic Activity: 4 inch step up 10 x 2 , UE assist Squats with UE assist x 10 Rec bike Full revolutions forward and back  Modalities: Ice pack post session x 10 minutes  Self Care: LE elevation above heart level to reduce edema , pt and daughter interested in bolster for home.      [facility] Adult PT Treatment:                                                DATE: 01-11-24  791.63ft  ((1155 - 1620 ft) AROM  -8 to 110 Therapeutic Exercise: Quad set x 30 Supine Heel Slide with Strap  2 x 10 Supine Short Arc Quad   2 x 15 Hamstring Stretch with Strap   2 x 15-30 sec hold left Long Arc  Quad 3 x 10 with 2 lb cuff wt  Deep squat holding onto counter  10 x 5 to 10 sec hold TKE with GTB  3 x 15   Manual Therapy: Manual for increase of extension and DF Manual for increase of flexion Retrograde massage for edema  TREATMENT DATE: 01-02-24  Eval , issue HEP, RICE    PATIENT EDUCATION:  Education details: POC  Explanation of findings, issue HEP,  PRICE education Person educated: Patient and dtr Education method: Explanation, Demonstration, Tactile cues, Verbal cues, and Handouts Education comprehension: verbalized understanding, returned demonstration, verbal cues required, tactile cues required, and needs further education  HOME EXERCISE PROGRAM: Access Code: [email or web address] URL: [email or web address] Date: 01/02/2024 Prepared by: Garen Lah  Exercises - Supine Knee Extension Stretch on Towel Roll  - 1 x daily - 7 x weekly - 3 sets - 10 reps - 5 sec  hold - Supine Heel Slide with Strap  - 1 x daily - 7 x weekly - 3 sets - 10 reps - Supine Short Arc Quad  - 1 x daily - 7 x weekly - 3 sets - 10 reps - Hamstring Stretch with Strap  - 1 x daily - 7 x weekly - 3 sets - 3-5 reps - 15-30 sec hold - Long Arc Quad  - 1 x daily - 7 x weekly - 3 sets - 10 reps - Mini Squat  - 1 x daily - 7 x weekly - 3 sets - 10 reps Updated HEP - Standing Terminal Knee Extension with Resistance  - 1 x daily - 7 x weekly - 3 sets - 15 reps - Deep squat holding onto kitchen sink  - 1 x daily - 7 x weekly - 1 sets - 10 reps - 5-10 sec hold ASSESSMENT:  CLINICAL IMPRESSION: Pt returns with dtr for 5th treatment. Complains of 5/10 pain but better than she was.  Pt encouraged to walk more at home. She does have continued edema in LE despite ice and elevation 3 x per day which MD stated he was not concerned and that she continue with routine. 6 MWT 819 ft. Pt performed  closed chain exercises today and performed edema massage and scar tissue massage. Will continue toward completion of goals     EVAL-  Patient is a 83 y.o. female who was seen today for physical therapy evaluation and treatment for s/p R  TKA on 12/06/23 by Dr Roda Shutters  Pt presents with impairments including pain, knee weakness, impaired ROM, difficulty with walking, stairs, and with transfers . Pt would benefit from skilled PT for 2 times a week for 8 weeks to address above impariments and functional limitations and return to pain-free PLOF.  OBJECTIVE IMPAIRMENTS: decreased activity tolerance, difficulty walking, decreased ROM, decreased strength, increased edema, obesity, and pain.   ACTIVITY LIMITATIONS: carrying, bending, standing, squatting, stairs, transfers, and locomotion level  PARTICIPATION LIMITATIONS: meal prep, cleaning, laundry, shopping, community activity, and mosque attendance and ablility to assume prayer position  PERSONAL FACTORS: Gout, Afib,  HBP , HTN, R TKA 12-06-23 are also affecting patient's functional outcome.   REHAB POTENTIAL: Excellent  CLINICAL DECISION MAKING: Stable/uncomplicated  EVALUATION COMPLEXITY: Low   GOALS: Goals reviewed with patient? Yes  SHORT TERM GOALS: Target date: 02-02-24 corrected  Independent with initial HEP Baseline:no knowledge Goal status: MET  2.  Report pain decrease at rest from   6/10 to   3/10. Baseline: 6/10 at worst 01-23-24  5/10 Goal status: ONGOING  3.  Demonstrate and verbalize understanding of condition management including RICE, positioning, use of A.D., HEP.  Baseline: no knowledge 01-23-24  does not tolerate game ready but trying to elevate at home Goal status: MET  4.  AROM of knee extension -10 to 100 flexion to increase mobility for transitional movements  Baseline: See AROM Goal status: MET  5.  Pt will begin home walking program up to 30 minutes a day minimum Baseline: unable to walk due to pain  Goal  status: ONGOING    LONG TERM GOALS: Target date: 03-01-24  Pt will be independent with advanced HEP Baseline: no knowledge Goal status: INITIAL  2.  Pt will be able to negotiate steps without exacerbation of pain greater than 1/10  Baseline: avoids steps due to pain before eval Goal status: INITIAL  3.  Pt will improve her R knee flexion to  </= 120 degrees and extension to </= 5 degrees with </= 2/10 pain for a more functional and efficient gait pattern Baseline: See AROM chart Goal status: INITIAL  4.  LEFS will improve from 29/80 (36.3%) to at least 40/80  50% Baseline: eval 29/80 (36.3%) Goal status: INITIAL  5.  Pt will be able to assume prayer position with minimal pain Baseline: unable to kneel and must use chair Goal status: INITIAL  6.  Pt will be able to perform all household chores independently and safely to be successful at living alone in safety Baseline: Pt needing help from family members Goal status: INITIAL   PLAN:  PT FREQUENCY: 2x/week  PT DURATION: 8 weeks  PLANNED INTERVENTIONS: 97164- PT Re-evaluation, 97110-Therapeutic exercises, 97530- Therapeutic activity, 97112- Neuromuscular re-education, 97535- Self Care, [id number]- Manual therapy, [phone]- Gait training, [phone]- Electrical stimulation (unattended), [phone]- Electrical stimulation (manual), Patient/Family education, Balance training, Stair training, Taping, Joint mobilization, Cryotherapy, and Moist heat  PLAN FOR NEXT SESSION: Progress TKA , gait training on stairs, manual   Garen Lah, PT, [facility] Certified Exercise Expert for the Aging Adult  01/23/24 12:50 PM Phone: [phone] Fax: [phone]

## 2024-01-24 NOTE — Therapy (Signed)
 OUTPATIENT PHYSICAL THERAPY LOWER EXTREMITY TREATMENT   Patient Name: Chelsea [name] MRN: [id number] DOB:[birth year], 83 y.o., female Today's Date: 01/25/2024  END OF SESSION:  PT End of Session - 01/25/24 1153     Visit Number 6    Number of Visits 16    Date for PT Re-Evaluation 03/01/24    Authorization Type MCR/ MCD    PT Start Time 1148    PT Stop Time 1230    PT Time Calculation (min) 42 min    Activity Tolerance Patient limited by pain;Patient limited by fatigue    Behavior During Therapy [facility] for tasks assessed/performed                Past Medical History:  Diagnosis Date   Arthritis    Atrial fibrillation (HCC)    Atrial fibrillation with RVR (HCC) 03/04/2015   CHF (congestive heart failure) (HCC)    Hypertension    Sleep apnea    does not use cpap   Past Surgical History:  Procedure Laterality Date   NO PAST SURGERIES     TOTAL KNEE ARTHROPLASTY Right 12/06/2023   Procedure: RIGHT TOTAL KNEE ARTHROPLASTY;  Surgeon: Tarry Kos, MD;  Location: [facility] OR;  Service: Orthopedics;  Laterality: Right;   Patient Active Problem List   Diagnosis Date Noted   Status post total right knee replacement 12/06/2023   Pitting edema 12/05/2023   Primary osteoarthritis of left knee 02/23/2021   Ankle impingement syndrome, left 02/23/2021   Primary osteoarthritis of right knee 12/03/2020   Chronic anticoagulation 03/06/2015   Edema-dopplers negative for DVT 03/06/2015   CHF exacerbation (HCC)    SOB (shortness of breath)    Chest pain    Acute on chronic diastolic congestive heart failure (HCC)    Atrial fibrillation with RVR (HCC) 03/04/2015   Essential hypertension 02/24/2015   Chronic diastolic heart failure (HCC) 12/10/2014   Permanent atrial fibrillation (HCC) 12/10/2014   Paroxysmal nocturnal dyspnea 12/10/2014    PCP: Medicine, [facility]   REFERRING PROVIDER: Cristie Hem, PA-C   REFERRING DIAG: [phone] (ICD-10-CM) - Status  post total right knee replacement   THERAPY DIAG:  Acute pain of right knee  Difficulty in walking, not elsewhere classified  Muscle weakness (generalized)  Rationale for Evaluation and Treatment: Rehabilitation  ONSET DATE: 12-06-23  SUBJECTIVE:   SUBJECTIVE STATEMENT: 01-25-24 I was so sore yesterday but today I am a 2/10 pain.  I am better. I brought meat pies today.   EVAL- Pain isnt too bad, I am getting better every day. I hope to get back to walking. I love walking.  I use a chair for Muslim prayer. I would like to get back to the floor. I just don't want to be in pain anymore. I live alone and must be able to care for myself.  I would like to be able to walk for exercise and take care of my home. I can only stand about 10 minutes and walk for about 10-15 minutes  PERTINENT HISTORY: Gout, Afib,  HBP , HTN, R TKA 12-06-23 PAIN:  Are you having pain? Yes: NPRS scale: 4/10 present  6/10 Pain location: Right knee Pain description: spasm Aggravating factors: stairs, standing  walking of longer than 10 min, bending knee and straightening knees.  Unable to kneel Relieving factors: ice and meds  PRECAUTIONS: None  RED FLAGS: None   WEIGHT BEARING RESTRICTIONS: Yes WBAT R  FALLS:  Has patient fallen in last  6 months? No  LIVING ENVIRONMENT: Lives with: lives alone Lives in: House/apartment Stairs:  entering the house is 5 steps with rail   Has following equipment at home: Single point cane and Environmental consultant - 2 wheeled  OCCUPATION: retired  widowed  PLOF: Independent  PATIENT GOALS: get back to walking and prayer position and  care for herself alone in home  NEXT MD VISIT: TBD  OBJECTIVE:  Note: Objective measures were completed at Evaluation unless otherwise noted.  DIAGNOSTIC FINDINGS: FINDINGS: Right knee arthroplasty in expected alignment. No periprosthetic lucency or fracture. There has been patellar resurfacing. Recent postsurgical change includes air and edema  in the soft tissues and joint space.   IMPRESSION: Right knee arthroplasty without immediate postoperative complication.     Electronically Signed   By: Narda Rutherford M.D.   On: 12/06/2023 16:34  PATIENT SURVEYS:  LEFS 29/80   36.3%  LEFS  40/80  50% COGNITION: Overall cognitive status: Within functional limits for tasks assessed     SENSATION: WFL  EDEMA:  Circumferential: Right 47 .5 cm    01-25-24  R 45.25 cm  MUSCLE LENGTH: Hamstrings: Right 64 deg; Left 65 deg   POSTURE: rounded shoulders, forward head, and obesity  PALPATION: Pt with well healing scar from R TKA, TTP and hypersensitivity over fat pad  LOWER EXTREMITY ROM:  Active ROM Right eval Left eval Right  01-11-24 Right  01/15/24  Right 01/17/24 Right 01-23-23  Hip flexion        Hip extension        Hip abduction        Hip adduction        Hip internal rotation        Hip external rotation        Knee flexion 108/112 P 140 110 110 AA 120 AA 116 AA 120  Knee extension -12 0 -8   -5  Ankle dorsiflexion 0 8      Ankle plantarflexion        Ankle inversion        Ankle eversion         (Blank rows = not tested) (Key: WFL = within functional limits not formally assessed, * = concordant pain, s = stiffness/stretching sensation, NT = not tested)  LOWER EXTREMITY MMT:  MMT Right eval Left eval  Hip flexion 4 4+  Hip extension 4- 4  Hip abduction 4- 4-  Hip adduction    Hip internal rotation    Hip external rotation    Knee flexion 3- 4+  Knee extension 3- 4  Ankle dorsiflexion    Ankle plantarflexion    Ankle inversion    Ankle eversion     (Blank rows = not tested)  LOWER EXTREMITY SPECIAL TESTS:  NT due to post surgery  FUNCTIONAL TESTS:  5 times sit to stand: 19:16 sec  01-11-24   791.0 ((1155 - 1620 ft) 01-23-24  819.59ft GAIT: Distance walked: Pt able to walk without AD into clinic about 150 ft Assistive device utilized: Single point cane and Walker - 2 wheeled Level of  assistance: Complete Independence Comments: Pt with slight antalgic gait  no AD in clinic   [facility] Adult PT Treatment:  DATE: 01-25-24 LEFS 40/80  50% Therapeutic Exercise: LAQ 10 x with 3 Lb cuff weights SAQ 10 x with 3 Lb cuff weights SLR  2 x 10 Manual Therapy: Knee extension with overpressure Retrograde massage for swelling   ( reduction to 45.25 cm circumferential over R Knee Knee flexion with distraction on elevated table  Therapeutic Activity: Steps ups on 6 inch step on Right knee 2 x 10 with UE support Walking up and down 4 steps with UE support step over step technique Lateral step ups on 6 inch step on Right knee 2 x 10 Step downs 2 x 10 on 2 inch  Using padded chair wt bearing in tall kneeling with bil UE support on back of chair in preparation for prayer position  [facility] Adult PT Treatment:                                                DATE: 01-23-24 AROM  -5 to 116  AA 120 Therapeutic Exercise: Recumbent bike 5 min Step ups on 6 inch 2 x 10 on R and L with 3# Cuff weights Side step ups on 6 inch step  2 x 10 on R and L with 3# Cuff weights Hip abduction on 6 inch step  2 x 10 on R and L with 3# Cuff weights Hip extension on 6 inch step  2 x 10 on R and L with 3# Cuff weights TKE with ball on wall  1 x 15 on Right LAQ 10 x with 3 Lb cuff weights SAQ 10 x with 3 Lb cuff weights Manual Therapy: Edema massage with legs elevated  Scar tissue massage Knee extension Over pressure  Therapeutic Activity:   819.0 ((1155 - 1620 ft) Stepping over with high march  [facility] Adult PT Treatment:                                                DATE: 01/17/24 Therapeutic Exercise: Supine SAQ 10 x 2  LAQ 10 x 2  SLR 10 x 1 QS 10 x 2  Manual Therapy: Edema massage with legs elevated  Seated knee joint oscillations to decrease pain  Knee extension Over pressure  Modalities: Vaso for edema  trial at coldest setting and medium  pressure. Pt only tolerated for 5 minutes.       [facility] Adult PT Treatment:                                                DATE: 01/15/24 Therapeutic Exercise: Supine hamstring stretch with strap SAQ 10 x 2  Heel prop with Quad set 10 x 2  Supine heel slides with strap  Step stretch for flexion and extension Slant board stretch  Therapeutic Activity: 4 inch step up 10 x 2 , UE assist Squats with UE assist x 10 Rec bike Full revolutions forward and back  Modalities: Ice pack post session x 10 minutes  Self Care: LE elevation above heart level to reduce edema , pt and daughter interested in bolster for home.      [facility]  Adult PT Treatment:                                                DATE: 01-11-24  791.42ft  ((1155 - 1620 ft) AROM  -8 to 110 Therapeutic Exercise: Quad set x 30 Supine Heel Slide with Strap  2 x 10 Supine Short Arc Quad   2 x 15 Hamstring Stretch with Strap   2 x 15-30 sec hold left Long Arc Quad 3 x 10 with 2 lb cuff wt  Deep squat holding onto counter  10 x 5 to 10 sec hold TKE with GTB  3 x 15   Manual Therapy: Manual for increase of extension and DF Manual for increase of flexion Retrograde massage for edema                                                                                                                           TREATMENT DATE: 01-02-24  Eval , issue HEP, RICE    PATIENT EDUCATION:  Education details: POC  Explanation of findings, issue HEP,  PRICE education Person educated: Patient and dtr Education method: Explanation, Demonstration, Tactile cues, Verbal cues, and Handouts Education comprehension: verbalized understanding, returned demonstration, verbal cues required, tactile cues required, and needs further education  HOME EXERCISE PROGRAM: Access Code: [email or web address] URL: [email or web address] Date: 01/02/2024 Prepared by: Garen Lah  Exercises - Supine Knee Extension Stretch on Towel Roll  - 1 x daily - 7 x  weekly - 3 sets - 10 reps - 5 sec  hold - Supine Heel Slide with Strap  - 1 x daily - 7 x weekly - 3 sets - 10 reps - Supine Short Arc Quad  - 1 x daily - 7 x weekly - 3 sets - 10 reps - Hamstring Stretch with Strap  - 1 x daily - 7 x weekly - 3 sets - 3-5 reps - 15-30 sec hold - Long Arc Quad  - 1 x daily - 7 x weekly - 3 sets - 10 reps - Mini Squat  - 1 x daily - 7 x weekly - 3 sets - 10 reps Updated HEP - Standing Terminal Knee Extension with Resistance  - 1 x daily - 7 x weekly - 3 sets - 15 reps - Deep squat holding onto kitchen sink  - 1 x daily - 7 x weekly - 1 sets - 10 reps - 5-10 sec hold ASSESSMENT:  CLINICAL IMPRESSION: Pt returns alone for  5th treatment. Complains of 3/10 pain  but was worse yesterday after progressive loading.  Pt also was able to negotiate steps with step over step technique and UE support.  Pt with difficulty with stepdowns and could only exercise with 2 inch step. Pt reports walking 10 minutes x 4 during the day.  And can stand 20 minutes.  All STG are achieved. LEFS improved to 40/80 50% today   Edema improved to 45.25 cm from eval circumferential.  Pt left with all questions answered and no adverse effects.      EVAL-  Patient is a 83 y.o. female who was seen today for physical therapy evaluation and treatment for s/p R  TKA on 12/06/23 by Dr Roda Shutters  Pt presents with impairments including pain, knee weakness, impaired ROM, difficulty with walking, stairs, and with transfers . Pt would benefit from skilled PT for 2 times a week for 8 weeks to address above impariments and functional limitations and return to pain-free PLOF.  OBJECTIVE IMPAIRMENTS: decreased activity tolerance, difficulty walking, decreased ROM, decreased strength, increased edema, obesity, and pain.   ACTIVITY LIMITATIONS: carrying, bending, standing, squatting, stairs, transfers, and locomotion level  PARTICIPATION LIMITATIONS: meal prep, cleaning, laundry, shopping, community activity, and  mosque attendance and ablility to assume prayer position  PERSONAL FACTORS: Gout, Afib,  HBP , HTN, R TKA 12-06-23 are also affecting patient's functional outcome.   REHAB POTENTIAL: Excellent  CLINICAL DECISION MAKING: Stable/uncomplicated  EVALUATION COMPLEXITY: Low   GOALS: Goals reviewed with patient? Yes  SHORT TERM GOALS: Target date: 02-02-24 corrected  Independent with initial HEP Baseline:no knowledge Goal status: MET  2.  Report pain decrease at rest from   6/10 to   3/10. Baseline: 6/10 at worst 01-23-24  5/10 01-25-24 Goal status: ONGOING  3.  Demonstrate and verbalize understanding of condition management including RICE, positioning, use of A.D., HEP.  Baseline: no knowledge 01-23-24  does not tolerate game ready but trying to elevate at home Goal status: MET  4.  AROM of knee extension -10 to 100 flexion to increase mobility for transitional movements Baseline: See AROM Goal status: MET  5.  Pt will begin home walking program up to 30 minutes a day minimum Baseline: unable to walk due to pain  01-25-24  Pt walked for 4 x 10 minutes constant walking 40 min total Goal status: MET    LONG TERM GOALS: Target date: 03-01-24  Pt will be independent with advanced HEP Baseline: no knowledge Goal status: INITIAL  2.  Pt will be able to negotiate steps without exacerbation of pain greater than 1/10  Baseline: avoids steps due to pain before eval 01-25-24 Goal status: ONGOING  3.  Pt will improve her R knee flexion to  </= 120 degrees and extension to </= 5 degrees with </= 2/10 pain for a more functional and efficient gait pattern Baseline: See AROM chart Goal status: ONGOING  4.  LEFS will improve from 29/80 (36.3%) to at least 40/80  50% Baseline: eval 29/80 (36.3%) Goal status: ONGOING  5.  Pt will be able to assume prayer position with minimal pain Baseline: unable to kneel and must use chair Goal status: ONGOING  6.  Pt will be able to perform all  household chores independently and safely to be successful at living alone in safety Baseline: Pt needing help from family members Goal status: ONGOING   PLAN:  PT FREQUENCY: 2x/week  PT DURATION: 8 weeks  PLANNED INTERVENTIONS: 97164- PT Re-evaluation, 97110-Therapeutic exercises, 97530- Therapeutic activity, 97112- Neuromuscular re-education, 97535- Self Care, [id number]- Manual therapy, [id number]- Gait training, [phone]- Electrical stimulation (unattended), [phone]- Electrical stimulation (manual), Patient/Family education, Balance training, Stair training, Taping, Joint mobilization, Cryotherapy, and Moist heat  PLAN FOR NEXT SESSION: Progress TKA , gait training on stairs, manual   Garen Lah, PT, [employer] Certified Exercise Expert for the Aging  Adult  01/25/24 12:48 PM Phone: [phone] Fax: [phone]

## 2024-01-25 ENCOUNTER — Ambulatory Visit: Payer: Medicare Other | Admitting: Physical Therapy

## 2024-01-25 DIAGNOSIS — M25561 Pain in right knee: Secondary | ICD-10-CM | POA: Diagnosis not present

## 2024-01-25 DIAGNOSIS — M6281 Muscle weakness (generalized): Secondary | ICD-10-CM

## 2024-01-25 DIAGNOSIS — R262 Difficulty in walking, not elsewhere classified: Secondary | ICD-10-CM

## 2024-01-29 ENCOUNTER — Ambulatory Visit: Payer: Medicare Other | Admitting: Physical Therapy

## 2024-01-31 NOTE — Therapy (Signed)
 OUTPATIENT PHYSICAL THERAPY LOWER EXTREMITY TREATMENT   Patient Name: Chelsea [name] MRN: [id number] DOB:[birth year], 83 y.o., female Today's Date: 02/01/2024  END OF SESSION:  PT End of Session - 02/01/24 1153     Visit Number 7    Number of Visits 16    Date for PT Re-Evaluation 03/01/24    Authorization Type MCR/ MCD    PT Start Time 1145    PT Stop Time 1230    PT Time Calculation (min) 45 min    Activity Tolerance Patient limited by fatigue    Behavior During Therapy [facility] for tasks assessed/performed                 Past Medical History:  Diagnosis Date   Arthritis    Atrial fibrillation (HCC)    Atrial fibrillation with RVR (HCC) 03/04/2015   CHF (congestive heart failure) (HCC)    Hypertension    Sleep apnea    does not use cpap   Past Surgical History:  Procedure Laterality Date   NO PAST SURGERIES     TOTAL KNEE ARTHROPLASTY Right 12/06/2023   Procedure: RIGHT TOTAL KNEE ARTHROPLASTY;  Surgeon: Tarry Kos, MD;  Location: [facility] OR;  Service: Orthopedics;  Laterality: Right;   Patient Active Problem List   Diagnosis Date Noted   Status post total right knee replacement 12/06/2023   Pitting edema 12/05/2023   Primary osteoarthritis of left knee 02/23/2021   Ankle impingement syndrome, left 02/23/2021   Primary osteoarthritis of right knee 12/03/2020   Chronic anticoagulation 03/06/2015   Edema-dopplers negative for DVT 03/06/2015   CHF exacerbation (HCC)    SOB (shortness of breath)    Chest pain    Acute on chronic diastolic congestive heart failure (HCC)    Atrial fibrillation with RVR (HCC) 03/04/2015   Essential hypertension 02/24/2015   Chronic diastolic heart failure (HCC) 12/10/2014   Permanent atrial fibrillation (HCC) 12/10/2014   Paroxysmal nocturnal dyspnea 12/10/2014    PCP: Medicine, [facility]   REFERRING PROVIDER: Cristie Hem, PA-C   REFERRING DIAG: [phone] (ICD-10-CM) - Status post total right knee  replacement   THERAPY DIAG:  Acute pain of right knee  Difficulty in walking, not elsewhere classified  Muscle weakness (generalized)  Rationale for Evaluation and Treatment: Rehabilitation  ONSET DATE: 12-06-23  SUBJECTIVE:   SUBJECTIVE STATEMENT: 02-01-24  I am exercising all the time. I am a 3/10 in my Right knee (medial knee)  I went outside to walk in backyard.     EVAL- Pain isnt too bad, I am getting better every day. I hope to get back to walking. I love walking.  I use a chair for Muslim prayer. I would like to get back to the floor. I just don't want to be in pain anymore. I live alone and must be able to care for myself.  I would like to be able to walk for exercise and take care of my home. I can only stand about 10 minutes and walk for about 10-15 minutes  PERTINENT HISTORY: Gout, Afib,  HBP , HTN, R TKA 12-06-23 PAIN:  Are you having pain? Yes: NPRS scale: 4/10 present  6/10 Pain location: Right knee Pain description: spasm Aggravating factors: stairs, standing  walking of longer than 10 min, bending knee and straightening knees.  Unable to kneel Relieving factors: ice and meds  PRECAUTIONS: None  RED FLAGS: None   WEIGHT BEARING RESTRICTIONS: Yes WBAT R  FALLS:  Has  patient fallen in last 6 months? No  LIVING ENVIRONMENT: Lives with: lives alone Lives in: House/apartment Stairs:  entering the house is 5 steps with rail   Has following equipment at home: Single point cane and Environmental consultant - 2 wheeled  OCCUPATION: retired  widowed  PLOF: Independent  PATIENT GOALS: get back to walking and prayer position and  care for herself alone in home  NEXT MD VISIT: TBD  OBJECTIVE:  Note: Objective measures were completed at Evaluation unless otherwise noted.  DIAGNOSTIC FINDINGS: FINDINGS: Right knee arthroplasty in expected alignment. No periprosthetic lucency or fracture. There has been patellar resurfacing. Recent postsurgical change includes air and edema in  the soft tissues and joint space.   IMPRESSION: Right knee arthroplasty without immediate postoperative complication.     Electronically Signed   By: Narda Rutherford M.D.   On: 12/06/2023 16:34  PATIENT SURVEYS:  LEFS 29/80   36.3%  3-20-25LEFS  40/80  50% COGNITION: Overall cognitive status: Within functional limits for tasks assessed     SENSATION: WFL  EDEMA:  Circumferential: Right 47 .5 cm    01-25-24  R 45.25 cm  MUSCLE LENGTH: Hamstrings: Right 64 deg; Left 65 deg   POSTURE: rounded shoulders, forward head, and obesity  PALPATION: Pt with well healing scar from R TKA, TTP and hypersensitivity over fat pad  LOWER EXTREMITY ROM:  Active ROM Right eval Left eval Right  01-11-24 Right  01/15/24  Right 01/17/24 Right 01-23-23  Hip flexion        Hip extension        Hip abduction        Hip adduction        Hip internal rotation        Hip external rotation        Knee flexion 108/112 P 140 110 110 AA 120 AA 116 AA 120  Knee extension -12 0 -8   -5  Ankle dorsiflexion 0 8      Ankle plantarflexion        Ankle inversion        Ankle eversion         (Blank rows = not tested) (Key: WFL = within functional limits not formally assessed, * = concordant pain, s = stiffness/stretching sensation, NT = not tested)  LOWER EXTREMITY MMT:  MMT Right eval Left eval  Hip flexion 4 4+  Hip extension 4- 4  Hip abduction 4- 4-  Hip adduction    Hip internal rotation    Hip external rotation    Knee flexion 3- 4+  Knee extension 3- 4  Ankle dorsiflexion    Ankle plantarflexion    Ankle inversion    Ankle eversion     (Blank rows = not tested)  LOWER EXTREMITY SPECIAL TESTS:  NT due to post surgery  FUNCTIONAL TESTS:  5 times sit to stand: 19:16 sec  02-01-24  16.2 sec   01-11-24   791.0 ((1155 - 1620 ft) 01-23-24  819.43ft GAIT: Distance walked: Pt able to walk without AD into clinic about 150 ft Assistive device utilized: Single point cane and  Walker - 2 wheeled Level of assistance: Complete Independence Comments: Pt with slight antalgic gait  no AD in clinic   [facility] Adult PT Treatment:  DATE: 02-01-24 Therapeutic Exercise: LAQ  right 10 x with 5 Lb cuff weights SAQ  right 10 x with 3 Lb cuff weights SLR   Right 2 x 10 TKE with BTB 2 x 20 R LE Manual Therapy: Knee extension with overpressure Knee flexion with distraction on elevated table Neuromuscular re-ed: SLS on there Ex pad SLS on flat surface Therapeutic Activity: STS  with 10lb DB  3 x 10 Steps ups on 6 inch step on Right knee 2 x 10 with UE support With UE support on elevated mat down to tall kneeling and vertical bridging for quad stretching and working into prayer stretch for more comfortable range  [facility] Adult PT Treatment:                                                DATE: 01-25-24 LEFS 40/80  50% Therapeutic Exercise: LAQ 10 x with 3 Lb cuff weights SAQ 10 x with 3 Lb cuff weights SLR  2 x 10 Manual Therapy: Knee extension with overpressure Retrograde massage for swelling   ( reduction to 45.25 cm circumferential over R Knee Knee flexion with distraction on elevated table  Therapeutic Activity: Steps ups on 6 inch step on Right knee 2 x 10 with UE support Walking up and down 4 steps with UE support step over step technique Lateral step ups on 6 inch step on Right knee 2 x 10 Step downs 2 x 10 on 2 inch  Using padded chair wt bearing in tall kneeling with bil UE support on back of chair in preparation for prayer position  [facility] Adult PT Treatment:                                                DATE: 01-23-24 AROM  -5 to 116  AA 120 Therapeutic Exercise: Recumbent bike 5 min Step ups on 6 inch 2 x 10 on R and L with 3# Cuff weights Side step ups on 6 inch step  2 x 10 on R and L with 3# Cuff weights Hip abduction on 6 inch step  2 x 10 on R and L with 3# Cuff weights Hip extension on 6 inch step  2 x 10 on R  and L with 3# Cuff weights TKE with ball on wall  1 x 15 on Right LAQ 10 x with 3 Lb cuff weights SAQ 10 x with 3 Lb cuff weights Manual Therapy: Edema massage with legs elevated  Scar tissue massage Knee extension Over pressure  Therapeutic Activity:   819.0 ((1155 - 1620 ft) Stepping over with high march  [facility] Adult PT Treatment:                                                DATE: 01/17/24 Therapeutic Exercise: Supine SAQ 10 x 2  LAQ 10 x 2  SLR 10 x 1 QS 10 x 2  Manual Therapy: Edema massage with legs elevated  Seated knee joint oscillations to decrease pain  Knee extension Over pressure  Modalities: Vaso for edema  trial at coldest  setting and medium pressure. Pt only tolerated for 5 minutes.                                                                                                                             TREATMENT DATE: 01-02-24  Eval , issue HEP, RICE    PATIENT EDUCATION:  Education details: POC  Explanation of findings, issue HEP,  PRICE education Person educated: Patient and dtr Education method: Explanation, Demonstration, Tactile cues, Verbal cues, and Handouts Education comprehension: verbalized understanding, returned demonstration, verbal cues required, tactile cues required, and needs further education  HOME EXERCISE PROGRAM: Access Code: [email or web address] URL: [email or web address] Date: 01/02/2024 Prepared by: Garen Lah  Exercises - Supine Knee Extension Stretch on Towel Roll  - 1 x daily - 7 x weekly - 3 sets - 10 reps - 5 sec  hold - Supine Heel Slide with Strap  - 1 x daily - 7 x weekly - 3 sets - 10 reps - Supine Short Arc Quad  - 1 x daily - 7 x weekly - 3 sets - 10 reps - Hamstring Stretch with Strap  - 1 x daily - 7 x weekly - 3 sets - 3-5 reps - 15-30 sec hold - Long Arc Quad  - 1 x daily - 7 x weekly - 3 sets - 10 reps - Mini Squat  - 1 x daily - 7 x weekly - 3 sets - 10 reps Updated HEP - Standing Terminal Knee  Extension with Resistance  - 1 x daily - 7 x weekly - 3 sets - 15 reps - Deep squat holding onto kitchen sink  - 1 x daily - 7 x weekly - 1 sets - 10 reps - 5-10 sec hold Added 02-01-24 - Step Up  - 1 x daily - 7 x weekly - 3 sets - 10 reps - Lateral Step Up  - 1 x daily - 7 x weekly - 3 sets - 10 reps ASSESSMENT:  CLINICAL IMPRESSION: Pt returns alone for  7th treatment. Complains of 3/10 pain but improving.  Pt  working on functional movement getting into prayer position and step ups.  Pt reports walking outside for exercise yesterday. Added to HEP today for increasing activity tolerance endurance and strength. All STG completed today. Pt left with all questions answered and no adverse effects.      EVAL-  Patient is a 83 y.o. female who was seen today for physical therapy evaluation and treatment for s/p R  TKA on 12/06/23 by Dr Roda Shutters  Pt presents with impairments including pain, knee weakness, impaired ROM, difficulty with walking, stairs, and with transfers . Pt would benefit from skilled PT for 2 times a week for 8 weeks to address above impariments and functional limitations and return to pain-free PLOF.  OBJECTIVE IMPAIRMENTS: decreased activity tolerance, difficulty walking, decreased ROM, decreased strength, increased edema, obesity, and pain.   ACTIVITY LIMITATIONS: carrying, bending, standing, squatting, stairs, transfers, and locomotion  level  PARTICIPATION LIMITATIONS: meal prep, cleaning, laundry, shopping, community activity, and mosque attendance and ablility to assume prayer position  PERSONAL FACTORS: Gout, Afib,  HBP , HTN, R TKA 12-06-23 are also affecting patient's functional outcome.   REHAB POTENTIAL: Excellent  CLINICAL DECISION MAKING: Stable/uncomplicated  EVALUATION COMPLEXITY: Low   GOALS: Goals reviewed with patient? Yes  SHORT TERM GOALS: Target date: 02-02-24 corrected  Independent with initial HEP Baseline:no knowledge Goal status: MET  2.  Report pain  decrease at rest from   6/10 to   3/10. Baseline: 6/10 at worst 01-23-24  5/10 02-01-24  3/10 Goal status: MET  3.  Demonstrate and verbalize understanding of condition management including RICE, positioning, use of A.D., HEP.  Baseline: no knowledge 01-23-24  does not tolerate game ready but trying to elevate at home Goal status: MET  4.  AROM of knee extension -10 to 100 flexion to increase mobility for transitional movements Baseline: See AROM Goal status: MET  5.  Pt will begin home walking program up to 30 minutes a day minimum Baseline: unable to walk due to pain  01-25-24  Pt walked for 4 x 10 minutes constant walking 40 min total Goal status: MET    LONG TERM GOALS: Target date: 03-01-24  Pt will be independent with advanced HEP Baseline: no knowledge Goal status: INITIAL  2.  Pt will be able to negotiate steps without exacerbation of pain greater than 1/10  Baseline: avoids steps due to pain before eval 01-25-24 Goal status: ONGOING  3.  Pt will improve her R knee flexion to  </= 120 degrees and extension to </= 5 degrees with </= 2/10 pain for a more functional and efficient gait pattern Baseline: See AROM chart Goal status: ONGOING  4.  LEFS will improve from 29/80 (36.3%) to at least 40/80  50% Baseline: eval 29/80 (36.3%) Goal status: ONGOING  5.  Pt will be able to assume prayer position with minimal pain Baseline: unable to kneel and must use chair Goal status: ONGOING  6.  Pt will be able to perform all household chores independently and safely to be successful at living alone in safety Baseline: Pt needing help from family members Goal status: ONGOING   PLAN:  PT FREQUENCY: 2x/week  PT DURATION: 8 weeks  PLANNED INTERVENTIONS: 97164- PT Re-evaluation, 97110-Therapeutic exercises, 97530- Therapeutic activity, 97112- Neuromuscular re-education, 97535- Self Care, 25366- Manual therapy, [phone]- Gait training, [phone]- Electrical stimulation (unattended),  [phone]- Electrical stimulation (manual), Patient/Family education, Balance training, Stair training, Taping, Joint mobilization, Cryotherapy, and Moist heat  PLAN FOR NEXT SESSION: Progress TKA , gait training on stairs, manual   Garen Lah, PT, [facility] Certified Exercise Expert for the Aging Adult  02/01/24 12:49 PM Phone: [phone] Fax: [phone]

## 2024-02-01 ENCOUNTER — Ambulatory Visit: Payer: Medicare Other | Admitting: Physical Therapy

## 2024-02-01 ENCOUNTER — Encounter: Payer: Self-pay | Admitting: Physical Therapy

## 2024-02-01 DIAGNOSIS — M6281 Muscle weakness (generalized): Secondary | ICD-10-CM

## 2024-02-01 DIAGNOSIS — M25561 Pain in right knee: Secondary | ICD-10-CM

## 2024-02-01 DIAGNOSIS — R262 Difficulty in walking, not elsewhere classified: Secondary | ICD-10-CM

## 2024-02-07 ENCOUNTER — Other Ambulatory Visit: Payer: Self-pay | Admitting: Physician Assistant

## 2024-02-07 ENCOUNTER — Telehealth: Payer: Self-pay | Admitting: Orthopaedic Surgery

## 2024-02-07 MED ORDER — HYDROCODONE-ACETAMINOPHEN 5-325 MG PO TABS: 1.0000 | ORAL_TABLET | Freq: Every day | ORAL | 0 refills | Status: DC | PRN

## 2024-02-07 NOTE — Therapy (Signed)
 OUTPATIENT PHYSICAL THERAPY LOWER EXTREMITY TREATMENT   Patient Name: Chelsea [name] MRN: [id number] DOB:[birth year], 83 y.o., female Today's Date: 02/08/2024  END OF SESSION:  PT End of Session - 02/08/24 0929     Visit Number 7    Number of Visits 16    Date for PT Re-Evaluation 03/01/24    Authorization Type MCR/ MCD    PT Start Time 0930    PT Stop Time 1015    PT Time Calculation (min) 45 min    Activity Tolerance Patient tolerated treatment well    Behavior During Therapy [facility] for tasks assessed/performed                  Past Medical History:  Diagnosis Date   Arthritis    Atrial fibrillation (HCC)    Atrial fibrillation with RVR (HCC) 03/04/2015   CHF (congestive heart failure) (HCC)    Hypertension    Sleep apnea    does not use cpap   Past Surgical History:  Procedure Laterality Date   NO PAST SURGERIES     TOTAL KNEE ARTHROPLASTY Right 12/06/2023   Procedure: RIGHT TOTAL KNEE ARTHROPLASTY;  Surgeon: Tarry Kos, MD;  Location: [facility] OR;  Service: Orthopedics;  Laterality: Right;   Patient Active Problem List   Diagnosis Date Noted   Status post total right knee replacement 12/06/2023   Pitting edema 12/05/2023   Primary osteoarthritis of left knee 02/23/2021   Ankle impingement syndrome, left 02/23/2021   Primary osteoarthritis of right knee 12/03/2020   Chronic anticoagulation 03/06/2015   Edema-dopplers negative for DVT 03/06/2015   CHF exacerbation (HCC)    SOB (shortness of breath)    Chest pain    Acute on chronic diastolic congestive heart failure (HCC)    Atrial fibrillation with RVR (HCC) 03/04/2015   Essential hypertension 02/24/2015   Chronic diastolic heart failure (HCC) 12/10/2014   Permanent atrial fibrillation (HCC) 12/10/2014   Paroxysmal nocturnal dyspnea 12/10/2014    PCP: Medicine, [facility]   REFERRING PROVIDER: Cristie Hem, PA-C   REFERRING DIAG: [phone] (ICD-10-CM) - Status post total  right knee replacement   THERAPY DIAG:  Acute pain of right knee  Difficulty in walking, not elsewhere classified  Muscle weakness (generalized)  Rationale for Evaluation and Treatment: Rehabilitation  ONSET DATE: 12-06-23  SUBJECTIVE:   SUBJECTIVE STATEMENT: 02-08-24  I was hurting in my knee last time. And I was at my grandson's  house and it was hard to get out of sofa. It was really low.    EVAL- Pain isnt too bad, I am getting better every day. I hope to get back to walking. I love walking.  I use a chair for Muslim prayer. I would like to get back to the floor. I just don't want to be in pain anymore. I live alone and must be able to care for myself.  I would like to be able to walk for exercise and take care of my home. I can only stand about 10 minutes and walk for about 10-15 minutes  PERTINENT HISTORY: Gout, Afib,  HBP , HTN, R TKA 12-06-23 PAIN:  Are you having pain? Yes: NPRS scale: 4/10 present  6/10 Pain location: Right knee Pain description: spasm Aggravating factors: stairs, standing  walking of longer than 10 min, bending knee and straightening knees.  Unable to kneel Relieving factors: ice and meds  PRECAUTIONS: None  RED FLAGS: None   WEIGHT BEARING RESTRICTIONS: Yes WBAT  R  FALLS:  Has patient fallen in last 6 months? No  LIVING ENVIRONMENT: Lives with: lives alone Lives in: House/apartment Stairs:  entering the house is 5 steps with rail   Has following equipment at home: Single point cane and Environmental consultant - 2 wheeled  OCCUPATION: retired  widowed  PLOF: Independent  PATIENT GOALS: get back to walking and prayer position and  care for herself alone in home  NEXT MD VISIT: TBD  OBJECTIVE:  Note: Objective measures were completed at Evaluation unless otherwise noted.  DIAGNOSTIC FINDINGS: FINDINGS: Right knee arthroplasty in expected alignment. No periprosthetic lucency or fracture. There has been patellar resurfacing. Recent postsurgical change  includes air and edema in the soft tissues and joint space.   IMPRESSION: Right knee arthroplasty without immediate postoperative complication.     Electronically Signed   By: Narda Rutherford M.D.   On: 12/06/2023 16:34  PATIENT SURVEYS:  LEFS 29/80   36.3%  3-20-25LEFS  40/80  50% COGNITION: Overall cognitive status: Within functional limits for tasks assessed     SENSATION: WFL  EDEMA:  Circumferential: Right 47 .5 cm    01-25-24  R 45.25 cm  MUSCLE LENGTH: Hamstrings: Right 64 deg; Left 65 deg   POSTURE: rounded shoulders, forward head, and obesity  PALPATION: Pt with well healing scar from R TKA, TTP and hypersensitivity over fat pad  LOWER EXTREMITY ROM:  Active ROM Right eval Left eval Right  01-11-24 Right  01/15/24  Right 01/17/24 Right 01-23-23 Right 02-08-24  Hip flexion         Hip extension         Hip abduction         Hip adduction         Hip internal rotation         Hip external rotation         Knee flexion 108/112 P 140 110 110 AA 120 AA 116 AA 120 118 AA 122  Knee extension -12 0 -8   -5 -3  Ankle dorsiflexion 0 8       Ankle plantarflexion         Ankle inversion         Ankle eversion          (Blank rows = not tested) (Key: WFL = within functional limits not formally assessed, * = concordant pain, s = stiffness/stretching sensation, NT = not tested)  LOWER EXTREMITY MMT:  MMT Right eval Left eval  Hip flexion 4 4+  Hip extension 4- 4  Hip abduction 4- 4-  Hip adduction    Hip internal rotation    Hip external rotation    Knee flexion 3- 4+  Knee extension 3- 4  Ankle dorsiflexion    Ankle plantarflexion    Ankle inversion    Ankle eversion     (Blank rows = not tested)  LOWER EXTREMITY SPECIAL TESTS:  NT due to post surgery  FUNCTIONAL TESTS:  5 times sit to stand: 19:16 sec  02-01-24  16.2 sec   01-11-24   791.0 ((1155 - 1620 ft) 01-23-24  819.46ft GAIT: Distance walked: Pt able to walk without AD into clinic  about 150 ft Assistive device utilized: Single point cane and Walker - 2 wheeled Level of assistance: Complete Independence Comments: Pt with slight antalgic gait  no AD in clinic   [facility] Adult PT Treatment:  DATE: 02-08-24 Therapeutic Exercise: Heel raise   15 bil and then 10 bil Toe raise 2 x 10 TKE with GTB bil 2 x 15  Standing 3-Way Leg Reach with GTB 1 x 10 R and then L and VC for correct execution  Reverse lunge 10 x 1  1/2 range with UE support  Neuromuscular re-ed: SL Stance using UE often working on my I Tandem walk forward and back Therapeutic Activity: STS  with 10lb DB  3 x 10 Steps ups on 6 inch step 2 x10 Lateral step ups 6 inch 2 x 10  [facility] Adult PT Treatment:                                                DATE: 02-01-24 Therapeutic Exercise: LAQ  right 10 x with 5 Lb cuff weights SAQ  right 10 x with 3 Lb cuff weights SLR   Right 2 x 10 TKE with BTB 2 x 20 R LE Manual Therapy: Knee extension with overpressure Knee flexion with distraction on elevated table Neuromuscular re-ed: SLS on there Ex pad SLS on flat surface Therapeutic Activity: STS  with 10lb DB  3 x 10 Steps ups on 6 inch step on Right knee 2 x 10 with UE support With UE support on elevated mat down to tall kneeling and vertical bridging for quad stretching and working into prayer stretch for more comfortable range  [facility] Adult PT Treatment:                                                DATE: 01-25-24 LEFS 40/80  50% Therapeutic Exercise: LAQ 10 x with 3 Lb cuff weights SAQ 10 x with 3 Lb cuff weights SLR  2 x 10 Manual Therapy: Knee extension with overpressure Retrograde massage for swelling   ( reduction to 45.25 cm circumferential over R Knee Knee flexion with distraction on elevated table  Therapeutic Activity: Steps ups on 6 inch step on Right knee 2 x 10 with UE support Walking up and down 4 steps with UE support step over step  technique Lateral step ups on 6 inch step on Right knee 2 x 10 Step downs 2 x 10 on 2 inch  Using padded chair wt bearing in tall kneeling with bil UE support on back of chair in preparation for prayer position  [facility] Adult PT Treatment:                                                DATE: 01-23-24 AROM  -5 to 116  AA 120 Therapeutic Exercise: Recumbent bike 5 min Step ups on 6 inch 2 x 10 on R and L with 3# Cuff weights Side step ups on 6 inch step  2 x 10 on R and L with 3# Cuff weights Hip abduction on 6 inch step  2 x 10 on R and L with 3# Cuff weights Hip extension on 6 inch step  2 x 10 on R and L with 3# Cuff weights TKE with ball on wall  1 x 15 on  Right LAQ 10 x with 3 Lb cuff weights SAQ 10 x with 3 Lb cuff weights Manual Therapy: Edema massage with legs elevated  Scar tissue massage Knee extension Over pressure  Therapeutic Activity:   819.0 ((1155 - 1620 ft) Stepping over with high march  [facility] Adult PT Treatment:                                                DATE: 01/17/24 Therapeutic Exercise: Supine SAQ 10 x 2  LAQ 10 x 2  SLR 10 x 1 QS 10 x 2  Manual Therapy: Edema massage with legs elevated  Seated knee joint oscillations to decrease pain  Knee extension Over pressure  Modalities: Vaso for edema  trial at coldest setting and medium pressure. Pt only tolerated for 5 minutes.                                                                                                                             TREATMENT DATE: 01-02-24  Eval , issue HEP, RICE    PATIENT EDUCATION:  Education details: POC  Explanation of findings, issue HEP,  PRICE education Person educated: Patient and dtr Education method: Explanation, Demonstration, Tactile cues, Verbal cues, and Handouts Education comprehension: verbalized understanding, returned demonstration, verbal cues required, tactile cues required, and needs further education  HOME EXERCISE PROGRAM: Access Code:  [email or web address] URL: [email or web address] Date: 01/02/2024 Prepared by: Garen Lah  Exercises - Supine Knee Extension Stretch on Towel Roll  - 1 x daily - 7 x weekly - 3 sets - 10 reps - 5 sec  hold - Supine Heel Slide with Strap  - 1 x daily - 7 x weekly - 3 sets - 10 reps - Supine Short Arc Quad  - 1 x daily - 7 x weekly - 3 sets - 10 reps - Hamstring Stretch with Strap  - 1 x daily - 7 x weekly - 3 sets - 3-5 reps - 15-30 sec hold - Long Arc Quad  - 1 x daily - 7 x weekly - 3 sets - 10 reps - Mini Squat  - 1 x daily - 7 x weekly - 3 sets - 10 reps Updated HEP - Standing Terminal Knee Extension with Resistance  - 1 x daily - 7 x weekly - 3 sets - 15 reps - Deep squat holding onto kitchen sink  - 1 x daily - 7 x weekly - 1 sets - 10 reps - 5-10 sec hold Added 02-01-24 - Step Up  - 1 x daily - 7 x weekly - 3 sets - 10 reps - Lateral Step Up  - 1 x daily - 7 x weekly - 3 sets - 10 reps Added 02-08-24  - Standing 3-Way Leg Reach with Resistance at Ankles and Counter Support  - 1  x daily - 7 x weekly - 3 sets - 10 reps - Reverse lunge holding onto counter  - 1 x daily - 7 x weekly - 3 sets - 10 reps ASSESSMENT:  CLINICAL IMPRESSION: Chelsea [name] making good progress with AROM.  Complains of 4/10 pain coming out of low sofa at grandson's house.  Noted pt has a small pustule on inferior edge of surgical scar where stitch is and cleaned area.  Pt added to HEP with resistive closed chain exercises for strength. Pt fatigued at end of session. Pt left with all questions answered and no adverse effects. Pt may benefit from 2-3 additional RX before DC. Work on floor to stand [employer] next visit.     EVAL-  Patient is a 83 y.o. female who was seen today for physical therapy evaluation and treatment for s/p R  TKA on 12/06/23 by Dr Roda Shutters  Pt presents with impairments including pain, knee weakness, impaired ROM, difficulty with walking, stairs, and with transfers . Pt would benefit from skilled PT  for 2 times a week for 8 weeks to address above impariments and functional limitations and return to pain-free PLOF.  OBJECTIVE IMPAIRMENTS: decreased activity tolerance, difficulty walking, decreased ROM, decreased strength, increased edema, obesity, and pain.   ACTIVITY LIMITATIONS: carrying, bending, standing, squatting, stairs, transfers, and locomotion level  PARTICIPATION LIMITATIONS: meal prep, cleaning, laundry, shopping, community activity, and mosque attendance and ablility to assume prayer position  PERSONAL FACTORS: Gout, Afib,  HBP , HTN, R TKA 12-06-23 are also affecting patient's functional outcome.   REHAB POTENTIAL: Excellent  CLINICAL DECISION MAKING: Stable/uncomplicated  EVALUATION COMPLEXITY: Low   GOALS: Goals reviewed with patient? Yes  SHORT TERM GOALS: Target date: 02-02-24 corrected  Independent with initial HEP Baseline:no knowledge Goal status: MET  2.  Report pain decrease at rest from   6/10 to   3/10. Baseline: 6/10 at worst 01-23-24  5/10 02-01-24  3/10 Goal status: MET  3.  Demonstrate and verbalize understanding of condition management including RICE, positioning, use of A.D., HEP.  Baseline: no knowledge 01-23-24  does not tolerate game ready but trying to elevate at home Goal status: MET  4.  AROM of knee extension -10 to 100 flexion to increase mobility for transitional movements Baseline: See AROM Goal status: MET  5.  Pt will begin home walking program up to 30 minutes a day minimum Baseline: unable to walk due to pain  01-25-24  Pt walked for 4 x 10 minutes constant walking 40 min total Goal status: MET    LONG TERM GOALS: Target date: 03-01-24  Pt will be independent with advanced HEP Baseline: no knowledge Goal status: INITIAL  2.  Pt will be able to negotiate steps without exacerbation of pain greater than 1/10  Baseline: avoids steps due to pain before eval 01-25-24 Goal status: ONGOING  3.  Pt will improve her R knee  flexion to  </= 120 degrees and extension to </= 5 degrees with </= 2/10 pain for a more functional and efficient gait pattern Baseline: See AROM chart Goal status: ONGOING  4.  LEFS will improve from 29/80 (36.3%) to at least 40/80  50% Baseline: eval 29/80 (36.3%) Goal status: ONGOING  5.  Pt will be able to assume prayer position with minimal pain Baseline: unable to kneel and must use chair Goal status: ONGOING  6.  Pt will be able to perform all household chores independently and safely to be successful at living alone in  safety Baseline: Pt needing help from family members Goal status: ONGOING   PLAN:  PT FREQUENCY: 2x/week  PT DURATION: 8 weeks  PLANNED INTERVENTIONS: 97164- PT Re-evaluation, 97110-Therapeutic exercises, 97530- Therapeutic activity, 97112- Neuromuscular re-education, 97535- Self Care, 16109- Manual therapy, [phone]- Gait training, [phone]- Electrical stimulation (unattended), [phone]- Electrical stimulation (manual), Patient/Family education, Balance training, Stair training, Taping, Joint mobilization, Cryotherapy, and Moist heat  PLAN FOR NEXT SESSION: Progress TKA , gait training on stairs, manual   Garen Lah, PT, [facility] Certified Exercise Expert for the Aging Adult  02/08/24 10:18 AM Phone: [phone] Fax: [phone]

## 2024-02-07 NOTE — Telephone Encounter (Signed)
 Notified patient.

## 2024-02-07 NOTE — Telephone Encounter (Signed)
 Need to continue to wean.  I sent in norco with a change in instructions to pharmacy on file

## 2024-02-07 NOTE — Telephone Encounter (Signed)
 Patient called and ask for a refill on Oxycodone.CB#[phone]

## 2024-02-08 ENCOUNTER — Encounter: Payer: Self-pay | Admitting: Physical Therapy

## 2024-02-08 ENCOUNTER — Ambulatory Visit: Attending: Physician Assistant | Admitting: Physical Therapy

## 2024-02-08 DIAGNOSIS — M6281 Muscle weakness (generalized): Secondary | ICD-10-CM | POA: Diagnosis present

## 2024-02-08 DIAGNOSIS — R262 Difficulty in walking, not elsewhere classified: Secondary | ICD-10-CM | POA: Diagnosis present

## 2024-02-08 DIAGNOSIS — M25561 Pain in right knee: Secondary | ICD-10-CM | POA: Diagnosis present

## 2024-02-13 ENCOUNTER — Ambulatory Visit

## 2024-02-13 ENCOUNTER — Ambulatory Visit: Admitting: Physical Therapy

## 2024-02-13 DIAGNOSIS — M25561 Pain in right knee: Secondary | ICD-10-CM | POA: Diagnosis not present

## 2024-02-13 DIAGNOSIS — R262 Difficulty in walking, not elsewhere classified: Secondary | ICD-10-CM

## 2024-02-13 DIAGNOSIS — M6281 Muscle weakness (generalized): Secondary | ICD-10-CM

## 2024-02-13 NOTE — Therapy (Signed)
 OUTPATIENT PHYSICAL THERAPY LOWER EXTREMITY TREATMENT   Patient Name: Chelsea [name] MRN: [id number] DOB:1941/09/26, 83 y.o., female Today's Date: 02/13/2024  END OF SESSION:  PT End of Session - 02/13/24 1120     Visit Number 9    Number of Visits 16    Date for PT Re-Evaluation 03/01/24    Authorization Type MCR/ MCD    PT Start Time 1104    PT Stop Time 1145    PT Time Calculation (min) 41 min    Activity Tolerance Patient tolerated treatment well    Behavior During Therapy [facility] for tasks assessed/performed                   Past Medical History:  Diagnosis Date   Arthritis    Atrial fibrillation (HCC)    Atrial fibrillation with RVR (HCC) 03/04/2015   CHF (congestive heart failure) (HCC)    Hypertension    Sleep apnea    does not use cpap   Past Surgical History:  Procedure Laterality Date   NO PAST SURGERIES     TOTAL KNEE ARTHROPLASTY Right 12/06/2023   Procedure: RIGHT TOTAL KNEE ARTHROPLASTY;  Surgeon: Tarry Kos, MD;  Location: [facility] OR;  Service: Orthopedics;  Laterality: Right;   Patient Active Problem List   Diagnosis Date Noted   Status post total right knee replacement 12/06/2023   Pitting edema 12/05/2023   Primary osteoarthritis of left knee 02/23/2021   Ankle impingement syndrome, left 02/23/2021   Primary osteoarthritis of right knee 12/03/2020   Chronic anticoagulation 03/06/2015   Edema-dopplers negative for DVT 03/06/2015   CHF exacerbation (HCC)    SOB (shortness of breath)    Chest pain    Acute on chronic diastolic congestive heart failure (HCC)    Atrial fibrillation with RVR (HCC) 03/04/2015   Essential hypertension 02/24/2015   Chronic diastolic heart failure (HCC) 12/10/2014   Permanent atrial fibrillation (HCC) 12/10/2014   Paroxysmal nocturnal dyspnea 12/10/2014    PCP: Medicine, [facility]   REFERRING PROVIDER: Cristie Hem, PA-C   REFERRING DIAG: [phone] (ICD-10-CM) - Status post total  right knee replacement   THERAPY DIAG:  Acute pain of right knee  Difficulty in walking, not elsewhere classified  Muscle weakness (generalized)  Rationale for Evaluation and Treatment: Rehabilitation  ONSET DATE: 12-06-23  SUBJECTIVE:   SUBJECTIVE STATEMENT: 02-08-24  Pt endorses R knee pain at 5/10.   EVAL- Pain isnt too bad, I am getting better every day. I hope to get back to walking. I love walking.  I use a chair for Muslim prayer. I would like to get back to the floor. I just don't want to be in pain anymore. I live alone and must be able to care for myself.  I would like to be able to walk for exercise and take care of my home. I can only stand about 10 minutes and walk for about 10-15 minutes  PERTINENT HISTORY: Gout, Afib,  HBP , HTN, R TKA 12-06-23 PAIN:  Are you having pain? Yes: NPRS scale: 4/10 present  6/10 Pain location: Right knee Pain description: spasm Aggravating factors: stairs, standing  walking of longer than 10 min, bending knee and straightening knees.  Unable to kneel Relieving factors: ice and meds  PRECAUTIONS: None  RED FLAGS: None   WEIGHT BEARING RESTRICTIONS: Yes WBAT R  FALLS:  Has patient fallen in last 6 months? No  LIVING ENVIRONMENT: Lives with: lives alone Lives in: House/apartment  Stairs:  entering the house is 5 steps with rail   Has following equipment at home: Single point cane and Walker - 2 wheeled  OCCUPATION: retired  widowed  PLOF: Independent  PATIENT GOALS: get back to walking and prayer position and  care for herself alone in home  NEXT MD VISIT: TBD  OBJECTIVE:  Note: Objective measures were completed at Evaluation unless otherwise noted.  DIAGNOSTIC FINDINGS: FINDINGS: Right knee arthroplasty in expected alignment. No periprosthetic lucency or fracture. There has been patellar resurfacing. Recent postsurgical change includes air and edema in the soft tissues and joint space.   IMPRESSION: Right knee  arthroplasty without immediate postoperative complication.     Electronically Signed   By: Narda Rutherford M.D.   On: 12/06/2023 16:34  PATIENT SURVEYS:  LEFS 29/80   36.3%  3-20-25LEFS  40/80  50% COGNITION: Overall cognitive status: Within functional limits for tasks assessed     SENSATION: WFL  EDEMA:  Circumferential: Right 47 .5 cm    01-25-24  R 45.25 cm  MUSCLE LENGTH: Hamstrings: Right 64 deg; Left 65 deg   POSTURE: rounded shoulders, forward head, and obesity  PALPATION: Pt with well healing scar from R TKA, TTP and hypersensitivity over fat pad  LOWER EXTREMITY ROM:  Active ROM Right eval Left eval Right  01-11-24 Right  01/15/24  Right 01/17/24 Right 01-23-23 Right 02-08-24  Hip flexion         Hip extension         Hip abduction         Hip adduction         Hip internal rotation         Hip external rotation         Knee flexion 108/112 P 140 110 110 AA 120 AA 116 AA 120 118 AA 122  Knee extension -12 0 -8   -5 -3  Ankle dorsiflexion 0 8       Ankle plantarflexion         Ankle inversion         Ankle eversion          (Blank rows = not tested) (Key: WFL = within functional limits not formally assessed, * = concordant pain, s = stiffness/stretching sensation, NT = not tested)  LOWER EXTREMITY MMT:  MMT Right eval Left eval  Hip flexion 4 4+  Hip extension 4- 4  Hip abduction 4- 4-  Hip adduction    Hip internal rotation    Hip external rotation    Knee flexion 3- 4+  Knee extension 3- 4  Ankle dorsiflexion    Ankle plantarflexion    Ankle inversion    Ankle eversion     (Blank rows = not tested)  LOWER EXTREMITY SPECIAL TESTS:  NT due to post surgery  FUNCTIONAL TESTS:  5 times sit to stand: 19:16 sec  02-01-24  16.2 sec   01-11-24   791.0 ((1155 - 1620 ft) 01-23-24  819.52ft GAIT: Distance walked: Pt able to walk without AD into clinic about 150 ft Assistive device utilized: Single point cane and Walker - 2 wheeled Level of  assistance: Complete Independence Comments: Pt with slight antalgic gait  no AD in clinic   [facility] Adult PT Treatment:  DATE: 02/13/24 Therapeutic Exercise: Heel raise   15 bil and then 10 bil Toe raise 2 x 10 TKE with GTB bil 2 x 15  Standing 3-Way Leg Reach with GTB 1 x 10 R and then L and VC for correct execution Reverse lunge 10 x 1  1/2 range with UE support Neuromuscular re-ed: SL Stance using UE often working on my I Tandem walk forward and back Therapeutic Activity: STS  with 10lb DB  3 x 10 Steps ups on 6 inch step 2 x10 Lateral step ups 6 inch 2 x 10  [facility] Adult PT Treatment:                                                DATE: 02-08-24 Therapeutic Exercise: Heel raise   15 bil and then 10 bil Toe raise 2 x 10 TKE with GTB bil 2 x 15  Standing 3-Way Leg Reach with GTB 1 x 10 R and then L and VC for correct execution  Reverse lunge 10 x 1  1/2 range with UE support Neuromuscular re-ed: SL Stance using UE often working on my I Tandem walk forward and back Therapeutic Activity: STS  with 10lb DB  3 x 10 Steps ups on 6 inch step 2 x10 Lateral step ups 6 inch 2 x 10  PATIENT EDUCATION:  Education details: POC  Explanation of findings, issue HEP,  PRICE education Person educated: Patient and dtr Education method: Explanation, Demonstration, Tactile cues, Verbal cues, and Handouts Education comprehension: verbalized understanding, returned demonstration, verbal cues required, tactile cues required, and needs further education  HOME EXERCISE PROGRAM: Access Code: [email or web address] URL: [email or web address] Date: 01/02/2024 Prepared by: Garen Lah  Exercises - Supine Knee Extension Stretch on Towel Roll  - 1 x daily - 7 x weekly - 3 sets - 10 reps - 5 sec  hold - Supine Heel Slide with Strap  - 1 x daily - 7 x weekly - 3 sets - 10 reps - Supine Short Arc Quad  - 1 x daily - 7 x weekly - 3 sets - 10 reps - Hamstring  Stretch with Strap  - 1 x daily - 7 x weekly - 3 sets - 3-5 reps - 15-30 sec hold - Long Arc Quad  - 1 x daily - 7 x weekly - 3 sets - 10 reps - Mini Squat  - 1 x daily - 7 x weekly - 3 sets - 10 reps Updated HEP - Standing Terminal Knee Extension with Resistance  - 1 x daily - 7 x weekly - 3 sets - 15 reps - Deep squat holding onto kitchen sink  - 1 x daily - 7 x weekly - 1 sets - 10 reps - 5-10 sec hold Added 02-01-24 - Step Up  - 1 x daily - 7 x weekly - 3 sets - 10 reps - Lateral Step Up  - 1 x daily - 7 x weekly - 3 sets - 10 reps Added 02-08-24  - Standing 3-Way Leg Reach with Resistance at Ankles and Counter Support  - 1 x daily - 7 x weekly - 3 sets - 10 reps - Reverse lunge holding onto counter  - 1 x daily - 7 x weekly - 3 sets - 10 reps ASSESSMENT:  CLINICAL IMPRESSION: Ms. Whidby participated in PT for strengthening, and balance and  funcitonal activities primarily in the [facility]. Pt tolerated PT today without adverse effects. Pt will continue to benefit from skilled PT to address impairments for improved R knee function. Will work on floor to stand c her next visit.  EVAL-  Patient is a 83 y.o. female who was seen today for physical therapy evaluation and treatment for s/p R  TKA on 12/06/23 by Dr Roda Shutters  Pt presents with impairments including pain, knee weakness, impaired ROM, difficulty with walking, stairs, and with transfers . Pt would benefit from skilled PT for 2 times a week for 8 weeks to address above impariments and functional limitations and return to pain-free PLOF.  OBJECTIVE IMPAIRMENTS: decreased activity tolerance, difficulty walking, decreased ROM, decreased strength, increased edema, obesity, and pain.   ACTIVITY LIMITATIONS: carrying, bending, standing, squatting, stairs, transfers, and locomotion level  PARTICIPATION LIMITATIONS: meal prep, cleaning, laundry, shopping, community activity, and mosque attendance and ablility to assume prayer position  PERSONAL FACTORS:  Gout, Afib,  HBP , HTN, R TKA 12-06-23 are also affecting patient's functional outcome.   REHAB POTENTIAL: Excellent  CLINICAL DECISION MAKING: Stable/uncomplicated  EVALUATION COMPLEXITY: Low   GOALS: Goals reviewed with patient? Yes  SHORT TERM GOALS: Target date: 02-02-24 corrected  Independent with initial HEP Baseline:no knowledge Goal status: MET  2.  Report pain decrease at rest from   6/10 to   3/10. Baseline: 6/10 at worst 01-23-24  5/10 02-01-24  3/10 Goal status: MET  3.  Demonstrate and verbalize understanding of condition management including RICE, positioning, use of A.D., HEP.  Baseline: no knowledge 01-23-24  does not tolerate game ready but trying to elevate at home Goal status: MET  4.  AROM of knee extension -10 to 100 flexion to increase mobility for transitional movements Baseline: See AROM Goal status: MET  5.  Pt will begin home walking program up to 30 minutes a day minimum Baseline: unable to walk due to pain  01-25-24  Pt walked for 4 x 10 minutes constant walking 40 min total Goal status: MET    LONG TERM GOALS: Target date: 03-01-24  Pt will be independent with advanced HEP Baseline: no knowledge Goal status: INITIAL  2.  Pt will be able to negotiate steps without exacerbation of pain greater than 1/10  Baseline: avoids steps due to pain before eval 01-25-24 Goal status: ONGOING  3.  Pt will improve her R knee flexion to  </= 120 degrees and extension to </= 5 degrees with </= 2/10 pain for a more functional and efficient gait pattern Baseline: See AROM chart Goal status: ONGOING  4.  LEFS will improve from 29/80 (36.3%) to at least 40/80  50% Baseline: eval 29/80 (36.3%) Goal status: ONGOING  5.  Pt will be able to assume prayer position with minimal pain Baseline: unable to kneel and must use chair Goal status: ONGOING  6.  Pt will be able to perform all household chores independently and safely to be successful at living alone in  safety Baseline: Pt needing help from family members Goal status: ONGOING   PLAN:  PT FREQUENCY: 2x/week  PT DURATION: 8 weeks  PLANNED INTERVENTIONS: 97164- PT Re-evaluation, 97110-Therapeutic exercises, 97530- Therapeutic activity, 97112- Neuromuscular re-education, 97535- Self Care, 16109- Manual therapy, [id number]- Gait training, [phone]- Electrical stimulation (unattended), [phone]- Electrical stimulation (manual), Patient/Family education, Balance training, Stair training, Taping, Joint mobilization, Cryotherapy, and Moist heat  PLAN FOR NEXT SESSION: Progress TKA , gait training on stairs, manual  [employer] MS, PT 02/13/24  11:48 AM

## 2024-02-22 ENCOUNTER — Encounter: Payer: Self-pay | Admitting: Physical Therapy

## 2024-02-22 ENCOUNTER — Ambulatory Visit: Admitting: Physical Therapy

## 2024-02-22 DIAGNOSIS — M25561 Pain in right knee: Secondary | ICD-10-CM

## 2024-02-22 DIAGNOSIS — M6281 Muscle weakness (generalized): Secondary | ICD-10-CM

## 2024-02-22 DIAGNOSIS — R262 Difficulty in walking, not elsewhere classified: Secondary | ICD-10-CM

## 2024-02-22 NOTE — Therapy (Signed)
 OUTPATIENT PHYSICAL THERAPY LOWER EXTREMITY TREATMENT   Patient Name: Chelsea [name] MRN: [id number] DOB:01-31-1941, 83 y.o., female Today's Date: 02/22/2024  END OF SESSION:  PT End of Session - 02/22/24 1456     Visit Number 10    Number of Visits 16    Date for PT Re-Evaluation 03/01/24    Authorization Type MCR/ MCD    PT Start Time 0253    PT Stop Time 0331    PT Time Calculation (min) 38 min                   Past Medical History:  Diagnosis Date   Arthritis    Atrial fibrillation (HCC)    Atrial fibrillation with RVR (HCC) 03/04/2015   CHF (congestive heart failure) (HCC)    Hypertension    Sleep apnea    does not use cpap   Past Surgical History:  Procedure Laterality Date   NO PAST SURGERIES     TOTAL KNEE ARTHROPLASTY Right 12/06/2023   Procedure: RIGHT TOTAL KNEE ARTHROPLASTY;  Surgeon: Wes Hamman, MD;  Location: [facility] OR;  Service: Orthopedics;  Laterality: Right;   Patient Active Problem List   Diagnosis Date Noted   Status post total right knee replacement 12/06/2023   Pitting edema 12/05/2023   Primary osteoarthritis of left knee 02/23/2021   Ankle impingement syndrome, left 02/23/2021   Primary osteoarthritis of right knee 12/03/2020   Chronic anticoagulation 03/06/2015   Edema-dopplers negative for DVT 03/06/2015   CHF exacerbation (HCC)    SOB (shortness of breath)    Chest pain    Acute on chronic diastolic congestive heart failure (HCC)    Atrial fibrillation with RVR (HCC) 03/04/2015   Essential hypertension 02/24/2015   Chronic diastolic heart failure (HCC) 12/10/2014   Permanent atrial fibrillation (HCC) 12/10/2014   Paroxysmal nocturnal dyspnea 12/10/2014    PCP: Medicine, [facility]   REFERRING PROVIDER: Sandie Cross, PA-C   REFERRING DIAG: [phone] (ICD-10-CM) - Status post total right knee replacement   THERAPY DIAG:  Acute pain of right knee  Difficulty in walking, not elsewhere  classified  Muscle weakness (generalized)  Rationale for Evaluation and Treatment: Rehabilitation  ONSET DATE: 12-06-23  SUBJECTIVE:   SUBJECTIVE STATEMENT: No pain today. Pain was bad last 2 days.    EVAL- Pain isnt too bad, I am getting better every day. I hope to get back to walking. I love walking.  I use a chair for Muslim prayer. I would like to get back to the floor. I just don't want to be in pain anymore. I live alone and must be able to care for myself.  I would like to be able to walk for exercise and take care of my home. I can only stand about 10 minutes and walk for about 10-15 minutes  PERTINENT HISTORY: Gout, Afib,  HBP , HTN, R TKA 12-06-23 PAIN:  Are you having pain? Yes: NPRS scale: 0/10 present  6/10 Pain location: Right knee Pain description: spasm Aggravating factors: stairs, standing  walking of longer than 10 min, bending knee and straightening knees.  Unable to kneel Relieving factors: ice and meds  PRECAUTIONS: None  RED FLAGS: None   WEIGHT BEARING RESTRICTIONS: Yes WBAT R  FALLS:  Has patient fallen in last 6 months? No  LIVING ENVIRONMENT: Lives with: lives alone Lives in: House/apartment Stairs:  entering the house is 5 steps with rail   Has following equipment at home: Single  point cane and Walker - 2 wheeled  OCCUPATION: retired  widowed  PLOF: Independent  PATIENT GOALS: get back to walking and prayer position and  care for herself alone in home  NEXT MD VISIT: TBD  OBJECTIVE:  Note: Objective measures were completed at Evaluation unless otherwise noted.  DIAGNOSTIC FINDINGS: FINDINGS: Right knee arthroplasty in expected alignment. No periprosthetic lucency or fracture. There has been patellar resurfacing. Recent postsurgical change includes air and edema in the soft tissues and joint space.   IMPRESSION: Right knee arthroplasty without immediate postoperative complication.     Electronically Signed   By: Chadwick Colonel  M.D.   On: 12/06/2023 16:34  PATIENT SURVEYS:  LEFS 29/80   36.3%  3-20-25LEFS  40/80  50% COGNITION: Overall cognitive status: Within functional limits for tasks assessed     SENSATION: WFL  EDEMA:  Circumferential: Right 47 .5 cm    01-25-24  R 45.25 cm  MUSCLE LENGTH: Hamstrings: Right 64 deg; Left 65 deg   POSTURE: rounded shoulders, forward head, and obesity  PALPATION: Pt with well healing scar from R TKA, TTP and hypersensitivity over fat pad  LOWER EXTREMITY ROM:  Active ROM Right eval Left eval Right  01-11-24 Right  01/15/24  Right 01/17/24 Right 01-23-23 Right 02-08-24  Hip flexion         Hip extension         Hip abduction         Hip adduction         Hip internal rotation         Hip external rotation         Knee flexion 108/112 P 140 110 110 AA 120 AA 116 AA 120 118 AA 122  Knee extension -12 0 -8   -5 -3  Ankle dorsiflexion 0 8       Ankle plantarflexion         Ankle inversion         Ankle eversion          (Blank rows = not tested) (Key: WFL = within functional limits not formally assessed, * = concordant pain, s = stiffness/stretching sensation, NT = not tested)  LOWER EXTREMITY MMT:  MMT Right eval Left eval  Hip flexion 4 4+  Hip extension 4- 4  Hip abduction 4- 4-  Hip adduction    Hip internal rotation    Hip external rotation    Knee flexion 3- 4+  Knee extension 3- 4  Ankle dorsiflexion    Ankle plantarflexion    Ankle inversion    Ankle eversion     (Blank rows = not tested)  LOWER EXTREMITY SPECIAL TESTS:  NT due to post surgery  FUNCTIONAL TESTS:  5 times sit to stand: 19:16 sec  02-01-24  16.2 sec   01-11-24   791.0 ((1155 - 1620 ft) 01-23-24  819.37ft GAIT: Distance walked: Pt able to walk without AD into clinic about 150 ft Assistive device utilized: Single point cane and Walker - 2 wheeled Level of assistance: Complete Independence Comments: Pt with slight antalgic gait  no AD in clinic   [facility] Adult PT  Treatment:                                                DATE: 02/22/24 Therapeutic Exercise: Knee ext 5# 10  x 2 bilat  Heel raise 10 x 2   Neuromuscular re-ed: Foam oval SLS  SLS level  Tandem level  Therapeutic Activity: Nustep L5 x 5 minutes STS  with 10lb DB  3 x 10 Steps ups on 6 inch step 2 x10 Lateral step ups 6 inch 2 x 10 Stair climbing  alternating up and down, both HR 4 inch step down with retro step up 1 HR Floor transfer using UE on Mat to lower onto foam pad- down to long sitting on floor and back up. Needed UE assist from table to get back in kneeling and assist from therapist to stand.     [facility] Adult PT Treatment:                                                DATE: 02/13/24 Therapeutic Exercise: Heel raise   15 bil and then 10 bil Toe raise 2 x 10 TKE with GTB bil 2 x 15  Standing 3-Way Leg Reach with GTB 1 x 10 R and then L and VC for correct execution Reverse lunge 10 x 1  1/2 range with UE support Neuromuscular re-ed: SL Stance using UE often working on my I Tandem walk forward and back Therapeutic Activity: STS  with 10lb DB  3 x 10 Steps ups on 6 inch step 2 x10 Lateral step ups 6 inch 2 x 10  [facility] Adult PT Treatment:                                                DATE: 02-08-24 Therapeutic Exercise: Heel raise   15 bil and then 10 bil Toe raise 2 x 10 TKE with GTB bil 2 x 15  Standing 3-Way Leg Reach with GTB 1 x 10 R and then L and VC for correct execution  Reverse lunge 10 x 1  1/2 range with UE support Neuromuscular re-ed: SL Stance using UE often working on my I Tandem walk forward and back Therapeutic Activity: STS  with 10lb DB  3 x 10 Steps ups on 6 inch step 2 x10 Lateral step ups 6 inch 2 x 10  PATIENT EDUCATION:  Education details: POC  Explanation of findings, issue HEP,  PRICE education Person educated: Patient and dtr Education method: Explanation, Demonstration, Tactile cues, Verbal cues, and Handouts Education comprehension:  verbalized understanding, returned demonstration, verbal cues required, tactile cues required, and needs further education  HOME EXERCISE PROGRAM: Access Code: [email or web address] URL: [email or web address] Date: 01/02/2024 Prepared by: Garen Lah  Exercises - Supine Knee Extension Stretch on Towel Roll  - 1 x daily - 7 x weekly - 3 sets - 10 reps - 5 sec  hold - Supine Heel Slide with Strap  - 1 x daily - 7 x weekly - 3 sets - 10 reps - Supine Short Arc Quad  - 1 x daily - 7 x weekly - 3 sets - 10 reps - Hamstring Stretch with Strap  - 1 x daily - 7 x weekly - 3 sets - 3-5 reps - 15-30 sec hold - Long Arc Quad  - 1 x daily - 7 x weekly - 3 sets - 10 reps - Mini Squat  - 1 x  daily - 7 x weekly - 3 sets - 10 reps Updated HEP - Standing Terminal Knee Extension with Resistance  - 1 x daily - 7 x weekly - 3 sets - 15 reps - Deep squat holding onto kitchen sink  - 1 x daily - 7 x weekly - 1 sets - 10 reps - 5-10 sec hold Added 02-01-24 - Step Up  - 1 x daily - 7 x weekly - 3 sets - 10 reps - Lateral Step Up  - 1 x daily - 7 x weekly - 3 sets - 10 reps Added 02-08-24  - Standing 3-Way Leg Reach with Resistance at Ankles and Counter Support  - 1 x daily - 7 x weekly - 3 sets - 10 reps - Reverse lunge holding onto counter  - 1 x daily - 7 x weekly - 3 sets - 10 reps ASSESSMENT:  CLINICAL IMPRESSION: Ms. Goodall participated in PT for strengthening, and balance and funcitonal activities primarily in the [facility]. Performed floor transfer with min assist and verbal cues.  Pt tolerated PT today without adverse effects. Pt will continue to benefit from skilled PT to address impairments for improved R knee function.   EVAL-  Patient is a 83 y.o. female who was seen today for physical therapy evaluation and treatment for s/p R  TKA on 12/06/23 by Dr Christiane Cowing  Pt presents with impairments including pain, knee weakness, impaired ROM, difficulty with walking, stairs, and with transfers . Pt would benefit from  skilled PT for 2 times a week for 8 weeks to address above impariments and functional limitations and return to pain-free PLOF.  OBJECTIVE IMPAIRMENTS: decreased activity tolerance, difficulty walking, decreased ROM, decreased strength, increased edema, obesity, and pain.   ACTIVITY LIMITATIONS: carrying, bending, standing, squatting, stairs, transfers, and locomotion level  PARTICIPATION LIMITATIONS: meal prep, cleaning, laundry, shopping, community activity, and mosque attendance and ablility to assume prayer position  PERSONAL FACTORS: Gout, Afib,  HBP , HTN, R TKA 12-06-23 are also affecting patient's functional outcome.   REHAB POTENTIAL: Excellent  CLINICAL DECISION MAKING: Stable/uncomplicated  EVALUATION COMPLEXITY: Low   GOALS: Goals reviewed with patient? Yes  SHORT TERM GOALS: Target date: 02-02-24 corrected  Independent with initial HEP Baseline:no knowledge Goal status: MET  2.  Report pain decrease at rest from   6/10 to   3/10. Baseline: 6/10 at worst 01-23-24  5/10 02-01-24  3/10 Goal status: MET  3.  Demonstrate and verbalize understanding of condition management including RICE, positioning, use of A.D., HEP.  Baseline: no knowledge 01-23-24  does not tolerate game ready but trying to elevate at home Goal status: MET  4.  AROM of knee extension -10 to 100 flexion to increase mobility for transitional movements Baseline: See AROM Goal status: MET  5.  Pt will begin home walking program up to 30 minutes a day minimum Baseline: unable to walk due to pain  01-25-24  Pt walked for 4 x 10 minutes constant walking 40 min total Goal status: MET    LONG TERM GOALS: Target date: 03-01-24  Pt will be independent with advanced HEP Baseline: no knowledge Goal status: INITIAL  2.  Pt will be able to negotiate steps without exacerbation of pain greater than 1/10  Baseline: avoids steps due to pain before eval 01-25-24 02/22/24: goes up and down stairs at home  Goal  status: MET  3.  Pt will improve her R knee flexion to  </= 120 degrees and extension to </= 5  degrees with </= 2/10 pain for a more functional and efficient gait pattern Baseline: See AROM chart 02/22/24: 115 Goal status: ONGOING  4.  LEFS will improve from 29/80 (36.3%) to at least 40/80  50% Baseline: eval 29/80 (36.3%) Goal status: ONGOING  5.  Pt will be able to assume prayer position with minimal pain Baseline: unable to kneel and must use chair Goal status: ONGOING  6.  Pt will be able to perform all household chores independently and safely to be successful at living alone in safety Baseline: Pt needing help from family members Goal status: ONGOING   PLAN:  PT FREQUENCY: 2x/week  PT DURATION: 8 weeks  PLANNED INTERVENTIONS: 97164- PT Re-evaluation, 97110-Therapeutic exercises, 97530- Therapeutic activity, 97112- Neuromuscular re-education, 97535- Self Care, 16109- Manual therapy, [phone]- Gait training, [phone]- Electrical stimulation (unattended), [phone]- Electrical stimulation (manual), Patient/Family education, Balance training, Stair training, Taping, Joint mobilization, Cryotherapy, and Moist heat  PLAN FOR NEXT SESSION: Progress TKA , gait training on stairs, manual  Gasper Karst, PTA 02/22/24 3:29 PM Phone: [phone] Fax: [phone]

## 2024-02-28 NOTE — Therapy (Addendum)
 OUTPATIENT PHYSICAL THERAPY LOWER EXTREMITY TREATMENT/DISCHARGE NOTE PHYSICAL THERAPY DISCHARGE SUMMARY  Visits from Start of Care: 10  Current functional level related to goals / functional outcomes: As indicated below   Remaining deficits: Pt has soft tissue pain over distal shin responds well to STW  Floor transfer with min A and minimal cues  Pt modified prayer position and is pleased with current function. Education / Equipment: HEP   Patient agrees to discharge. Patient goals were met. Patient is being discharged due to meeting the stated rehab goals. And being pleased with current functional level   Patient Name: Pearlean Sabina MRN: [id number] DOB:08-Sep-1941, 83 y.o., female Today's Date: 02/29/2024  END OF SESSION:  PT End of Session - 02/29/24 1020     Visit Number 10    Number of Visits 16    Date for PT Re-Evaluation 03/01/24    Authorization Type MCR/ MCD    PT Start Time 1017    PT Stop Time 1100    PT Time Calculation (min) 43 min    Activity Tolerance Patient tolerated treatment well;No increased pain    Behavior During Therapy [facility] for tasks assessed/performed                    Past Medical History:  Diagnosis Date   Arthritis    Atrial fibrillation (HCC)    Atrial fibrillation with RVR (HCC) 03/04/2015   CHF (congestive heart failure) (HCC)    Hypertension    Sleep apnea    does not use cpap   Past Surgical History:  Procedure Laterality Date   NO PAST SURGERIES     TOTAL KNEE ARTHROPLASTY Right 12/06/2023   Procedure: RIGHT TOTAL KNEE ARTHROPLASTY;  Surgeon: Wes Hamman, MD;  Location: [facility] OR;  Service: Orthopedics;  Laterality: Right;   Patient Active Problem List   Diagnosis Date Noted   Status post total right knee replacement 12/06/2023   Pitting edema 12/05/2023   Primary osteoarthritis of left knee 02/23/2021   Ankle impingement syndrome, left 02/23/2021   Primary osteoarthritis of right knee 12/03/2020   Chronic  anticoagulation 03/06/2015   Edema-dopplers negative for DVT 03/06/2015   CHF exacerbation (HCC)    SOB (shortness of breath)    Chest pain    Acute on chronic diastolic congestive heart failure (HCC)    Atrial fibrillation with RVR (HCC) 03/04/2015   Essential hypertension 02/24/2015   Chronic diastolic heart failure (HCC) 12/10/2014   Permanent atrial fibrillation (HCC) 12/10/2014   Paroxysmal nocturnal dyspnea 12/10/2014    PCP: Medicine, [facility]   REFERRING PROVIDER: Sandie Cross, PA-C   REFERRING DIAG: [id number] (ICD-10-CM) - Status post total right knee replacement   THERAPY DIAG:  Acute pain of right knee  Difficulty in walking, not elsewhere classified  Muscle weakness (generalized)  Rationale for Evaluation and Treatment: Rehabilitation  ONSET DATE: 12-06-23  SUBJECTIVE:   SUBJECTIVE STATEMENT: No pain today. I am doing well   EVAL- Pain isnt too bad, I am getting better every day. I hope to get back to walking. I love walking.  I use a chair for Muslim prayer. I would like to get back to the floor. I just don't want to be in pain anymore. I live alone and must be able to care for myself.  I would like to be able to walk for exercise and take care of my home. I can only stand about 10 minutes and walk for about 10-15  minutes  PERTINENT HISTORY: Gout, Afib,  HBP , HTN, R TKA 12-06-23 PAIN:  Are you having pain? Yes: NPRS scale: 0/10 present  6/10 Pain location: Right knee Pain description: spasm Aggravating factors: stairs, standing  walking of longer than 10 min, bending knee and straightening knees.  Unable to kneel Relieving factors: ice and meds  PRECAUTIONS: None  RED FLAGS: None   WEIGHT BEARING RESTRICTIONS: Yes WBAT R  FALLS:  Has patient fallen in last 6 months? No  LIVING ENVIRONMENT: Lives with: lives alone Lives in: House/apartment Stairs: entering the house is 5 steps with rail   Has following equipment at home:  Single point cane and Environmental consultant - 2 wheeled  OCCUPATION: retired  widowed  PLOF: Independent  PATIENT GOALS: get back to walking and prayer position and  care for herself alone in home  NEXT MD VISIT: TBD  OBJECTIVE:  Note: Objective measures were completed at Evaluation unless otherwise noted.  DIAGNOSTIC FINDINGS: FINDINGS: Right knee arthroplasty in expected alignment. No periprosthetic lucency or fracture. There has been patellar resurfacing. Recent postsurgical change includes air and edema in the soft tissues and joint space.   IMPRESSION: Right knee arthroplasty without immediate postoperative complication.     Electronically Signed   By: Chadwick Colonel M.D.   On: 12/06/2023 16:34  PATIENT SURVEYS:  LEFS 29/80   36.3%  3-20-25LEFS  40/80  50% 02-29-24 LEFS 68/80  85%  COGNITION: Overall cognitive status: Within functional limits for tasks assessed     SENSATION: WFL  EDEMA:  Circumferential: Right 47 .5 cm   01-25-24  R 45.25 cm  MUSCLE LENGTH: Hamstrings: Right 64 deg; Left 65 deg   POSTURE: rounded shoulders, forward head, and obesity  PALPATION: Pt with well healing scar from R TKA, TTP and hypersensitivity over fat pad  LOWER EXTREMITY ROM:  Active ROM Right eval Left eval Right  01-11-24 Right  01/15/24  Right 01/17/24 Right 01-23-23 Right 02-08-24 Right  02-29-24  Hip flexion          Hip extension          Hip abduction          Hip adduction          Hip internal rotation          Hip external rotation          Knee flexion 108/112 P 140 110 110 AA 120 AA 116 AA 120 118 AA 122 120 AA 125   Knee extension -12 0 -8   -5 -3 0  Ankle dorsiflexion 0 8        Ankle plantarflexion          Ankle inversion          Ankle eversion           (Blank rows = not tested) (Key: WFL = within functional limits not formally assessed, * = concordant pain, s = stiffness/stretching sensation, NT = not tested)  LOWER EXTREMITY MMT:  MMT Right eval  Left eval R/L 02-29-24  Hip flexion 4 4+ 4+/4+  Hip extension 4- 4 4+/4+  Hip abduction 4- 4- 4/4  Hip adduction     Hip internal rotation     Hip external rotation     Knee flexion 3- 4+ 4+/5  Knee extension 3- 4 4/5  Ankle dorsiflexion     Ankle plantarflexion     Ankle inversion     Ankle eversion      (  Blank rows = not tested)  LOWER EXTREMITY SPECIAL TESTS:  NT due to post surgery  FUNCTIONAL TESTS:  5 times sit to stand: 19:16 sec  02-01-24  16.2 sec   01-11-24   791.0 ((1155 - 1620 ft) 01-23-24  819.37ft 02-29-24   1051.00 ft  (1155 - 1620 ft) GAIT: Distance walked: Pt able to walk without AD into clinic about 150 ft Assistive device utilized: Single point cane and Walker - 2 wheeled Level of assistance: Complete Independence Comments: Pt with slight antalgic gait  no AD in clinic   [facility] Adult PT Treatment:                                                DATE: 02-29-24 Therapeutic Exercise: Heel raise bil  Review of HEP for home use Manual Therapy: STW over soft tissue over tibialis anterior responds well to manual Neuromuscular re-ed: Foam oval SLS  SLS level  Tandem level  Therapeutic Activity: Steps ups on 6 inch step 2 x10 Lateral step ups 6 inch 2 x 10  1051.00 ft  (1155 - 1620 ft) Squatting at sink  [facility] Adult PT Treatment:                                                DATE: 02/22/24 Therapeutic Exercise: Knee ext 5# 10 x 2 bilat  Heel raise 10 x 2   Neuromuscular re-ed: Foam oval SLS  SLS level  Tandem level  Therapeutic Activity: Nustep L5 x 5 minutes STS  with 10lb DB  3 x 10 Steps ups on 6 inch step 2 x10 Lateral step ups 6 inch 2 x 10 Stair climbing  alternating up and down, both HR 4 inch step down with retro step up 1 HR Floor transfer using UE on Mat to lower onto foam pad- down to long sitting on floor and back up. Needed UE assist from table to get back in kneeling and assist from therapist to stand.     [facility] Adult PT  Treatment:                                                DATE: 02/13/24 Therapeutic Exercise: Heel raise   15 bil and then 10 bil Toe raise 2 x 10 TKE with GTB bil 2 x 15  Standing 3-Way Leg Reach with GTB 1 x 10 R and then L and VC for correct execution Reverse lunge 10 x 1  1/2 range with UE support Neuromuscular re-ed: SL Stance using UE often working on my I Tandem walk forward and back Therapeutic Activity: STS  with 10lb DB  3 x 10 Steps ups on 6 inch step 2 x10 Lateral step ups 6 inch 2 x 10  [facility] Adult PT Treatment:                                                DATE: 02-08-24 Therapeutic Exercise:  Heel raise   15 bil and then 10 bil Toe raise 2 x 10 TKE with GTB bil 2 x 15  Standing 3-Way Leg Reach with GTB 1 x 10 R and then L and VC for correct execution  Reverse lunge 10 x 1  1/2 range with UE support Neuromuscular re-ed: SL Stance using UE often working on my I Tandem walk forward and back Therapeutic Activity: STS  with 10lb DB  3 x 10 Steps ups on 6 inch step 2 x10 Lateral step ups 6 inch 2 x 10  PATIENT EDUCATION:  Education details: POC  Explanation of findings, issue HEP,  PRICE education Person educated: Patient and dtr Education method: Explanation, Demonstration, Tactile cues, Verbal cues, and Handouts Education comprehension: verbalized understanding, returned demonstration, verbal cues required, tactile cues required, and needs further education  HOME EXERCISE PROGRAM: Access Code: [email or web address] URL: [email or web address] Date: 01/02/2024 Prepared by: Sharlet Dawson  Exercises - Supine Knee Extension Stretch on Towel Roll  - 1 x daily - 7 x weekly - 3 sets - 10 reps - 5 sec  hold - Supine Heel Slide with Strap  - 1 x daily - 7 x weekly - 3 sets - 10 reps - Supine Short Arc Quad  - 1 x daily - 7 x weekly - 3 sets - 10 reps - Hamstring Stretch with Strap  - 1 x daily - 7 x weekly - 3 sets - 3-5 reps - 15-30 sec hold - Long Arc Quad  - 1 x  daily - 7 x weekly - 3 sets - 10 reps - Mini Squat  - 1 x daily - 7 x weekly - 3 sets - 10 reps Updated HEP - Standing Terminal Knee Extension with Resistance  - 1 x daily - 7 x weekly - 3 sets - 15 reps - Deep squat holding onto kitchen sink  - 1 x daily - 7 x weekly - 1 sets - 10 reps - 5-10 sec hold Added 02-01-24 - Step Up  - 1 x daily - 7 x weekly - 3 sets - 10 reps - Lateral Step Up  - 1 x daily - 7 x weekly - 3 sets - 10 reps Added 02-08-24  - Standing 3-Way Leg Reach with Resistance at Ankles and Counter Support  - 1 x daily - 7 x weekly - 3 sets - 10 reps - Reverse lunge holding onto counter  - 1 x daily - 7 x weekly - 3 sets - 10 reps ASSESSMENT:  CLINICAL IMPRESSION: Ms. Bouchillon participated in PT for strengthening,balance and funcitonal activities primarily in the [facility] today.  improved to  1051.00 ft  (1155 - 1620 ft)  but still not in  normal for age. She also greatly improved LEFS 68/80  85% Right knee TKA improved to 0-120 AROM today. Pt reviewed HEP for home use and reminded of importance of continuing to move/walk throughout the day. Pt DC at end of session without adverse effects with all goals met and achieving prayer position with modified position at pt request.   EVAL-  Patient is a 83 y.o. female who was seen today for physical therapy evaluation and treatment for s/p R  TKA on 12/06/23 by Dr Christiane Cowing  Pt presents with impairments including pain, knee weakness, impaired ROM, difficulty with walking, stairs, and with transfers . Pt would benefit from skilled PT for 2 times a week for 8 weeks to address above impariments and functional limitations and return to  pain-free PLOF.  OBJECTIVE IMPAIRMENTS: decreased activity tolerance, difficulty walking, decreased ROM, decreased strength, increased edema, obesity, and pain.   ACTIVITY LIMITATIONS: carrying, bending, standing, squatting, stairs, transfers, and locomotion level  PARTICIPATION LIMITATIONS: meal prep, cleaning, laundry,  shopping, community activity, and mosque attendance and ablility to assume prayer position  PERSONAL FACTORS: Gout, Afib,  HBP , HTN, R TKA 12-06-23 are also affecting patient's functional outcome.   REHAB POTENTIAL: Excellent  CLINICAL DECISION MAKING: Stable/uncomplicated  EVALUATION COMPLEXITY: Low   GOALS: Goals reviewed with patient? Yes  SHORT TERM GOALS: Target date: 02-02-24 corrected  Independent with initial HEP Baseline:no knowledge Goal status: MET  2.  Report pain decrease at rest from   6/10 to   3/10. Baseline: 6/10 at worst 01-23-24  5/10 02-01-24  3/10 Goal status: MET  3.  Demonstrate and verbalize understanding of condition management including RICE, positioning, use of A.D., HEP.  Baseline: no knowledge 01-23-24  does not tolerate game ready but trying to elevate at home Goal status: MET  4.  AROM of knee extension -10 to 100 flexion to increase mobility for transitional movements Baseline: See AROM Goal status: MET  5.  Pt will begin home walking program up to 30 minutes a day minimum Baseline: unable to walk due to pain  01-25-24  Pt walked for 4 x 10 minutes constant walking 40 min total Goal status: MET    LONG TERM GOALS: Target date: 03-01-24  Pt will be independent with advanced HEP Baseline: no knowledge Goal status:MET  2.  Pt will be able to negotiate steps without exacerbation of pain greater than 1/10  Baseline: avoids steps due to pain before eval 01-25-24 02/22/24: goes up and down stairs at home  Goal status: MET  3.  Pt will improve her R knee flexion to  </= 120 degrees and extension to </= 5 degrees with </= 2/10 pain for a more functional and efficient gait pattern Baseline: See AROM chart 02/22/24: 115 02-29-24   0-120 Goal status: MET  4.  LEFS will improve from 29/80 (36.3%) to at least 40/80  50% Baseline: eval 29/80 (36.3%) 02-29-24  68/80  85% Goal status: MET  5.  Pt will be able to assume prayer position with minimal  pain Baseline: unable to kneel and must use chair 02-29-24  Pt able to modify using her couch at home Goal status:  Partially MET  6.  Pt will be able to perform all household chores independently and safely to be successful at living alone in safety Baseline: Pt needing help from family members 02-29-24  Pt able to perform household chores and cook for family Goal status: MET   PLAN:  PT FREQUENCY: 2x/week  PT DURATION: 8 weeks  PLANNED INTERVENTIONS: 97164- PT Re-evaluation, 97110-Therapeutic exercises, 97530- Therapeutic activity, 97112- Neuromuscular re-education, 97535- Self Care, 16109- Manual therapy, [phone]- Gait training, [phone]- Electrical stimulation (unattended), [phone]- Electrical stimulation (manual), Patient/Family education, Balance training, Stair training, Taping, Joint mobilization, Cryotherapy, and Moist heat  PLAN FOR NEXT SESSION: Progress TKA , gait training on stairs, manual  Sharlet Dawson, PT, [employer] Certified Exercise Expert for the Aging Adult  02/29/24 12:27 PM Phone: [phone] Fax: [phone]   Sharlet Dawson, PT, [employer] Certified Exercise Expert for the Aging Adult  04/16/24 2:57 PM Phone: [phone] Fax: [phone]

## 2024-02-29 ENCOUNTER — Encounter: Payer: Self-pay | Admitting: Physical Therapy

## 2024-02-29 ENCOUNTER — Ambulatory Visit: Admitting: Physical Therapy

## 2024-02-29 DIAGNOSIS — M25561 Pain in right knee: Secondary | ICD-10-CM

## 2024-02-29 DIAGNOSIS — R262 Difficulty in walking, not elsewhere classified: Secondary | ICD-10-CM

## 2024-02-29 DIAGNOSIS — M6281 Muscle weakness (generalized): Secondary | ICD-10-CM

## 2024-03-01 ENCOUNTER — Ambulatory Visit: Admitting: Orthopaedic Surgery

## 2024-03-01 DIAGNOSIS — Z96651 Presence of right artificial knee joint: Secondary | ICD-10-CM

## 2024-03-01 NOTE — Progress Notes (Signed)
   Post-Op Visit Note   Patient: Chelsea [name]           Date of Birth: 1941/05/01           MRN: [id number] Visit Date: 03/01/2024 PCP: Medicine, [facility]   Assessment & Plan:  Chief Complaint:  Chief Complaint  Patient presents with   Right Knee - Pain   Visit Diagnoses:  1. Status post total right knee replacement     Plan: Patient is 23-month status post right total knee arthroplasty.  She has completed physical therapy.  Overall she is doing well.  She reports swelling and pain to the tibia.  She has not been wearing compression socks.  Examination shows fully healed surgical scar.  Excellent range of motion.  Normal gait pattern.  I am very happy with her recovery from surgery.  Recommend compression socks to help with the pain from the edema.  Recheck in 3 months with a repeat radiographs.  Follow-Up Instructions: Return in about 3 months (around 05/31/2024) for with lindsey.   Orders:  No orders of the defined types were placed in this encounter.  No orders of the defined types were placed in this encounter.   Imaging: No results found.  PMFS History: Patient Active Problem List   Diagnosis Date Noted   Status post total right knee replacement 12/06/2023   Pitting edema 12/05/2023   Primary osteoarthritis of left knee 02/23/2021   Ankle impingement syndrome, left 02/23/2021   Primary osteoarthritis of right knee 12/03/2020   Chronic anticoagulation 03/06/2015   Edema-dopplers negative for DVT 03/06/2015   CHF exacerbation (HCC)    SOB (shortness of breath)    Chest pain    Acute on chronic diastolic congestive heart failure (HCC)    Atrial fibrillation with RVR (HCC) 03/04/2015   Essential hypertension 02/24/2015   Chronic diastolic heart failure (HCC) 12/10/2014   Permanent atrial fibrillation (HCC) 12/10/2014   Paroxysmal nocturnal dyspnea 12/10/2014   Past Medical History:  Diagnosis Date   Arthritis    Atrial  fibrillation (HCC)    Atrial fibrillation with RVR (HCC) 03/04/2015   CHF (congestive heart failure) (HCC)    Hypertension    Sleep apnea    does not use cpap    Family History  Problem Relation Age of Onset   Heart failure Father     Past Surgical History:  Procedure Laterality Date   NO PAST SURGERIES     TOTAL KNEE ARTHROPLASTY Right 12/06/2023   Procedure: RIGHT TOTAL KNEE ARTHROPLASTY;  Surgeon: Wes Hamman, MD;  Location: [facility] OR;  Service: Orthopedics;  Laterality: Right;   Social History   Occupational History   Not on file  Tobacco Use   Smoking status: Never   Smokeless tobacco: Never  Vaping Use   Vaping status: Never Used  Substance and Sexual Activity   Alcohol use: No   Drug use: No   Sexual activity: Never

## 2024-05-31 ENCOUNTER — Ambulatory Visit (INDEPENDENT_AMBULATORY_CARE_PROVIDER_SITE_OTHER): Payer: Self-pay

## 2024-05-31 ENCOUNTER — Ambulatory Visit: Admitting: Physician Assistant

## 2024-05-31 DIAGNOSIS — Z96651 Presence of right artificial knee joint: Secondary | ICD-10-CM | POA: Diagnosis not present

## 2024-05-31 NOTE — Progress Notes (Signed)
 Post-Op Visit Note   Patient: Chelsea [name]           Date of Birth: [birth year]           MRN: [id number] Visit Date: 05/31/2024 PCP: Medicine, [facility]   Assessment & Plan:  Chief Complaint:  Chief Complaint  Patient presents with   Right Knee - Follow-up    Right total knee arthroplasty 12/06/2023   Visit Diagnoses:  1. Status post total right knee replacement     Plan: Patient is a pleasant 83 year old female who comes in today 41-month status post right total knee replacement 12/06/2023.  She has been doing well with the exception of continued pain to the distal tibia/fibula.  This however has improved.  Her daughter notes that she has taken 2-3 falls since undergoing knee replacement surgery.  Examination of the right knee reveals range of motion 0 to 120 degrees.  Stable vascular stress.  She is neurovascular intact distally.  She does have moderate tenderness along the midshaft of the tibia and fibula.  She is neurovascular intact distally.  This point, her knee is doing great.  Continue with a home exercise program.  Regards to the shin pain, there is a questionable fracture to the midshaft of the fibula.  Will treat this symptomatically.  She may use topical NSAIDs as needed.  Follow-up in 3 months for repeat evaluation.  Call with concerns or questions.  Follow-Up Instructions: Return in about 3 months (around 08/31/2024).   Orders:  Orders Placed This Encounter  Procedures   XR Knee 1-2 Views Right   XR Tibia/Fibula Right   No orders of the defined types were placed in this encounter.   Imaging: XR Knee 1-2 Views Right Result Date: 05/31/2024 Well-seated prosthesis without complication  XR Tibia/Fibula Right Result Date: 05/31/2024 Questionable fracture through the midshaft of the fibula   PMFS History: Patient Active Problem List   Diagnosis Date Noted   Status post total right knee replacement 12/06/2023   Pitting edema 12/05/2023    Primary osteoarthritis of left knee 02/23/2021   Ankle impingement syndrome, left 02/23/2021   Primary osteoarthritis of right knee 12/03/2020   Chronic anticoagulation 03/06/2015   Edema-dopplers negative for DVT 03/06/2015   CHF exacerbation (HCC)    SOB (shortness of breath)    Chest pain    Acute on chronic diastolic congestive heart failure (HCC)    Atrial fibrillation with RVR (HCC) 03/04/2015   Essential hypertension 02/24/2015   Chronic diastolic heart failure (HCC) 12/10/2014   Permanent atrial fibrillation (HCC) 12/10/2014   Paroxysmal nocturnal dyspnea 12/10/2014   Past Medical History:  Diagnosis Date   Arthritis    Atrial fibrillation (HCC)    Atrial fibrillation with RVR (HCC) 03/04/2015   CHF (congestive heart failure) (HCC)    Hypertension    Sleep apnea    does not use cpap    Family History  Problem Relation Age of Onset   Heart failure Father     Past Surgical History:  Procedure Laterality Date   NO PAST SURGERIES     TOTAL KNEE ARTHROPLASTY Right 12/06/2023   Procedure: RIGHT TOTAL KNEE ARTHROPLASTY;  Surgeon: Jerri Kay HERO, MD;  Location: [facility] OR;  Service: Orthopedics;  Laterality: Right;   Social History   Occupational History   Not on file  Tobacco Use   Smoking status: Never   Smokeless tobacco: Never  Vaping Use   Vaping status: Never Used  Substance and  Sexual Activity   Alcohol use: No   Drug use: No   Sexual activity: Never

## 2024-08-23 ENCOUNTER — Ambulatory Visit: Admitting: Orthopaedic Surgery

## 2024-09-09 ENCOUNTER — Encounter: Payer: Self-pay | Admitting: Radiology

## 2024-09-10 ENCOUNTER — Ambulatory Visit: Admitting: Orthopaedic Surgery

## 2024-09-10 ENCOUNTER — Other Ambulatory Visit: Payer: Self-pay

## 2024-09-10 DIAGNOSIS — G8929 Other chronic pain: Secondary | ICD-10-CM

## 2024-09-10 DIAGNOSIS — M25511 Pain in right shoulder: Secondary | ICD-10-CM | POA: Diagnosis not present

## 2024-09-10 DIAGNOSIS — Z96651 Presence of right artificial knee joint: Secondary | ICD-10-CM | POA: Diagnosis not present

## 2024-09-10 NOTE — Progress Notes (Signed)
 Office Visit Note   Patient: Chelsea [name]           Date of Birth: 1941-02-08           MRN: [id number] Visit Date: 09/10/2024              Requested by: Medicine, [facility] [address] [place],  [place] [id number] PCP: Medicine, [facility]   Assessment & Plan: Visit Diagnoses:  1. Chronic right shoulder pain   2. Status post total right knee replacement     Plan: History of Present Illness Chelsea [name] is an 83 year old female who presents for follow-up after knee replacement surgery and evaluation of shoulder pain. She is accompanied by her family.  She underwent knee replacement surgery nine months ago and completed physical therapy two months ago. She continues home exercises but experiences a sensation of heaviness in the leg and some knee pain. Her weight is currently 175 pounds, with a loss of nine pounds since surgery.  Forty-five days ago, she sustained a proximal humerus fracture from a fall in [place], with an x-ray indicating a fracture. [place]. She experiences shoulder pain and reduced range of motion.  Physical Exam MEASUREMENTS: Weight- 175 lbs. MUSCULOSKELETAL: Right shoulder exam shows reduced range of motion due to pain consistent with healing proximal humerus fracture. Soft tissue swelling around knee, post-surgery.  Results RADIOLOGY Shoulder X-ray:  Subacute healing proximal humerus fracture (07/27/2024)  Assessment and Plan Healing right proximal humerus fracture.  86 weeks old. - Referred to physical therapy for shoulder rehabilitation to improve strength and range of motion.  Pain and swelling around right knee replacement Persistent pain and swelling likely due to soft tissue swelling post-surgery. No structural issues identified. - Continue home exercises to strengthen the knee. - Encouraged continued weight loss to aid in knee  recovery.  Follow-Up Instructions: Return in about 6 weeks (around 10/22/2024).   Orders:  Orders Placed This Encounter  Procedures   XR Shoulder Right   Ambulatory referral to Physical Therapy   No orders of the defined types were placed in this encounter.     Procedures: No procedures performed   Clinical Data: No additional findings.   Subjective: Chief Complaint  Patient presents with   Right Knee - Follow-up    Right TKA 12/06/2023   Right Shoulder - Injury    HPI  Review of Systems  Constitutional: Negative.   HENT: Negative.    Eyes: Negative.   Respiratory: Negative.    Cardiovascular: Negative.   Endocrine: Negative.   Musculoskeletal: Negative.   Neurological: Negative.   Hematological: Negative.   Psychiatric/Behavioral: Negative.    All other systems reviewed and are negative.    Objective: Vital Signs: LMP  (LMP Unknown)   Physical Exam Vitals and nursing note reviewed.  Constitutional:      Appearance: She is well-developed.  HENT:     Head: Atraumatic.     Nose: Nose normal.  Eyes:     Extraocular Movements: Extraocular movements intact.  Cardiovascular:     Pulses: Normal pulses.  Pulmonary:     Effort: Pulmonary effort is normal.  Abdominal:     Palpations: Abdomen is soft.  Musculoskeletal:     Cervical back: Neck supple.  Skin:    General: Skin is warm.     Capillary Refill: Capillary refill takes less than 2 seconds.  Neurological:     Mental Status:  She is alert. Mental status is at baseline.  Psychiatric:        Behavior: Behavior normal.        Thought Content: Thought content normal.        Judgment: Judgment normal.     Ortho Exam  Specialty Comments:  No specialty comments available.  Imaging: XR Shoulder Right Result Date: 09/10/2024 Xrays show subacute healing proximal humerus fracture in acceptable alignment.    PMFS History: Patient Active Problem List   Diagnosis Date Noted   Status post total  right knee replacement 12/06/2023   Pitting edema 12/05/2023   Primary osteoarthritis of left knee 02/23/2021   Ankle impingement syndrome, left 02/23/2021   Primary osteoarthritis of right knee 12/03/2020   Chronic anticoagulation 03/06/2015   Edema-dopplers negative for DVT 03/06/2015   CHF exacerbation (HCC)    SOB (shortness of breath)    Chest pain    Acute on chronic diastolic congestive heart failure (HCC)    Atrial fibrillation with RVR (HCC) 03/04/2015   Essential hypertension 02/24/2015   Chronic diastolic heart failure (HCC) 12/10/2014   Permanent atrial fibrillation (HCC) 12/10/2014   Paroxysmal nocturnal dyspnea 12/10/2014   Past Medical History:  Diagnosis Date   Arthritis    Atrial fibrillation (HCC)    Atrial fibrillation with RVR (HCC) 03/04/2015   CHF (congestive heart failure) (HCC)    Hypertension    Sleep apnea    does not use cpap    Family History  Problem Relation Age of Onset   Heart failure Father     Past Surgical History:  Procedure Laterality Date   NO PAST SURGERIES     TOTAL KNEE ARTHROPLASTY Right 12/06/2023   Procedure: RIGHT TOTAL KNEE ARTHROPLASTY;  Surgeon: Jerri Kay HERO, MD;  Location: [facility] OR;  Service: Orthopedics;  Laterality: Right;   Social History   Occupational History   Not on file  Tobacco Use   Smoking status: Never   Smokeless tobacco: Never  Vaping Use   Vaping status: Never Used  Substance and Sexual Activity   Alcohol use: No   Drug use: No   Sexual activity: Never

## 2024-09-19 ENCOUNTER — Ambulatory Visit: Admitting: Orthopaedic Surgery

## 2024-09-19 DIAGNOSIS — M65332 Trigger finger, left middle finger: Secondary | ICD-10-CM

## 2024-09-19 MED ORDER — BUPIVACAINE HCL 0.5 % IJ SOLN
0.3300 mL | INTRAMUSCULAR | Status: AC | PRN
  Administered 2024-09-19: .33 mL

## 2024-09-19 MED ORDER — METHYLPREDNISOLONE ACETATE 40 MG/ML IJ SUSP
13.3300 mg | INTRAMUSCULAR | Status: AC | PRN
  Administered 2024-09-19: 13.33 mg

## 2024-09-19 MED ORDER — LIDOCAINE HCL 1 % IJ SOLN
0.3000 mL | INTRAMUSCULAR | Status: AC | PRN
  Administered 2024-09-19: .3 mL

## 2024-09-19 NOTE — Progress Notes (Signed)
 Office Visit Note   Patient: Chelsea [name]           Date of Birth: 1941-01-06           MRN: [id number] Visit Date: 09/19/2024              Requested by: Medicine, [facility] [address] [place],  [place] [id number] PCP: Medicine, [facility]   Assessment & Plan: Visit Diagnoses:  1. Trigger finger, left middle finger     Plan: History of Present Illness Chelsea [name] is an 83 year old female who presents with persistent left middle finger pain and locking due to trigger finger.  She experiences pain and locking in her middle finger, with pain radiating to her wrist. A previous cortisone injection performed at [facility] provided temporary relief for one week but did not result in long-term improvement.  Assessment and Plan Left middle trigger finger Chronic trigger finger with persistent symptoms despite prior cortisone injection. Discussed treatment options including repeat injection or surgery. Briefly discussed what surgery involves. Recommended if second injection fails. - Recommended wearing a splint at night for two weeks. - Discussed surgical option if injection is ineffective. - Administered injection today.  Language barrier increased complexity of the visit.  Follow-Up Instructions: No follow-ups on file.   Orders:  Orders Placed This Encounter  Procedures   Hand/UE Inj   No orders of the defined types were placed in this encounter.     Procedures: Hand/UE Inj: L long A1 for trigger finger on 09/19/2024 8:03 PM Indications: pain Details: 25 G needle Medications: 0.3 mL lidocaine  1 %; 0.33 mL bupivacaine  0.5 %; 13.33 mg methylPREDNISolone acetate 40 MG/ML Outcome: tolerated well, no immediate complications Consent was given by the patient. Patient was prepped and draped in the usual sterile fashion.       Clinical Data: No additional findings.   Subjective: Chief Complaint   Patient presents with   Left Hand - Pain    Middle finger    HPI  Review of Systems  Constitutional: Negative.   HENT: Negative.    Eyes: Negative.   Respiratory: Negative.    Cardiovascular: Negative.   Endocrine: Negative.   Musculoskeletal: Negative.   Neurological: Negative.   Hematological: Negative.   Psychiatric/Behavioral: Negative.    All other systems reviewed and are negative.    Objective: Vital Signs: LMP  (LMP Unknown)   Physical Exam Vitals and nursing note reviewed.  Constitutional:      Appearance: She is well-developed.  HENT:     Head: Atraumatic.     Nose: Nose normal.  Eyes:     Extraocular Movements: Extraocular movements intact.  Cardiovascular:     Pulses: Normal pulses.  Pulmonary:     Effort: Pulmonary effort is normal.  Abdominal:     Palpations: Abdomen is soft.  Musculoskeletal:     Cervical back: Neck supple.  Skin:    General: Skin is warm.     Capillary Refill: Capillary refill takes less than 2 seconds.  Neurological:     Mental Status: She is alert. Mental status is at baseline.  Psychiatric:        Behavior: Behavior normal.        Thought Content: Thought content normal.        Judgment: Judgment normal.     PMFS History: Patient Active Problem List   Diagnosis Date Noted   Trigger finger, left  middle finger 09/19/2024   Status post total right knee replacement 12/06/2023   Pitting edema 12/05/2023   Primary osteoarthritis of left knee 02/23/2021   Ankle impingement syndrome, left 02/23/2021   Primary osteoarthritis of right knee 12/03/2020   Chronic anticoagulation 03/06/2015   Edema-dopplers negative for DVT 03/06/2015   CHF exacerbation (HCC)    SOB (shortness of breath)    Chest pain    Acute on chronic diastolic congestive heart failure (HCC)    Atrial fibrillation with RVR (HCC) 03/04/2015   Essential hypertension 02/24/2015   Chronic diastolic heart failure (HCC) 12/10/2014   Permanent atrial  fibrillation (HCC) 12/10/2014   Paroxysmal nocturnal dyspnea 12/10/2014   Past Medical History:  Diagnosis Date   Arthritis    Atrial fibrillation (HCC)    Atrial fibrillation with RVR (HCC) 03/04/2015   CHF (congestive heart failure) (HCC)    Hypertension    Sleep apnea    does not use cpap    Family History  Problem Relation Age of Onset   Heart failure Father     Past Surgical History:  Procedure Laterality Date   NO PAST SURGERIES     TOTAL KNEE ARTHROPLASTY Right 12/06/2023   Procedure: RIGHT TOTAL KNEE ARTHROPLASTY;  Surgeon: Jerri Kay HERO, MD;  Location: [facility] OR;  Service: Orthopedics;  Laterality: Right;   Social History   Occupational History   Not on file  Tobacco Use   Smoking status: Never   Smokeless tobacco: Never  Vaping Use   Vaping status: Never Used  Substance and Sexual Activity   Alcohol use: No   Drug use: No   Sexual activity: Never

## 2024-09-23 ENCOUNTER — Other Ambulatory Visit: Payer: Self-pay

## 2024-09-23 ENCOUNTER — Encounter: Payer: Self-pay | Admitting: Sports Medicine

## 2024-09-23 ENCOUNTER — Ambulatory Visit: Admitting: Sports Medicine

## 2024-09-23 DIAGNOSIS — M1A0420 Idiopathic chronic gout, left hand, without tophus (tophi): Secondary | ICD-10-CM

## 2024-09-23 DIAGNOSIS — M65332 Trigger finger, left middle finger: Secondary | ICD-10-CM | POA: Diagnosis not present

## 2024-09-23 DIAGNOSIS — M79642 Pain in left hand: Secondary | ICD-10-CM | POA: Diagnosis not present

## 2024-09-23 MED ORDER — COLCHICINE 0.6 MG PO TABS: ORAL_TABLET | ORAL | 0 refills | Status: AC

## 2024-09-23 NOTE — Progress Notes (Signed)
 Patient says that she had an injection last week (09/19/2024) for her left middle finger trigger finger, and since then has had pain, redness, and swelling over the left CMC space. She is unable to move her left thumb or use her left hand due to pain. She does have a history of gout, with the most recent flare being 6-7 months ago. She has tried ice and Tylenol , but her pain and swelling do seem to be getting worse. She denies any numbness or tingling in the hand.

## 2024-09-23 NOTE — Progress Notes (Signed)
 Chelsea [name] - 83 y.o. female MRN [id number]  Date of birth: 1941-05-13  Office Visit Note: Visit Date: 09/23/2024 PCP: Medicine, [facility] Referred by: Medicine, [facility]*  Subjective: Chief Complaint  Patient presents with   Left Hand - Pain   HPI: Chelsea [name] is a pleasant 83 y.o. female who presents today acute left hand/thumb pain, follow-up of trigger finger.  Patient's daughter was present during the entirety of the visit and does help provide some of HPI.  On 09/19/2024 she was seen by Dr. Jerri for trigger finger, this was her second injection as she had a previous trigger finger injection performed at [facility] which gave her relief for 1 week.  She states that following this, later that day/next morning she had sudden onset of left thumb pain and swelling.  Does feel warm to the touch.  She does have a history of gout that has been at multiple different joints including the foot, possible shoulder, most recently in the right hand/wrist.  She is managed on allopurinol 100 mg daily.  There was discussion whether she was on allopurinol or colchicine, we did call the pharmacy and she has not filled colchicine recently, he had been on routine dose of allopurinol 100 mg daily.  Independent note reviewed from [facility] from 04/16/2024.  Diagnosed with suspected gout flare in the shoulder and finger.  Was prescribed colchicine and Indocin.  They were considering increasing allopurinol to 200 mg follow-up but was never performed.  Last uric acid level on 04/16/24 was: 5.4   She also has a history of atrial fibrillation with prior RVR.  She is managed on Eliquis  5 mg twice daily as well as Cardizem  for rate control.  Lab Results  Component Value Date   HGBA1C 6.3 (H) 03/05/2015   Pertinent ROS were reviewed with the patient and found to be negative unless otherwise specified above in HPI.   Assessment &  Plan: Visit Diagnoses:  1. 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 Pain in left hand   3. Trigger middle finger of left hand    Plan: Impression is acute onset of left thumb and radial sided wrist swelling, pain and redness, indicative of gouty flare.  She does have a history of gout with previous flares in multiple different joints.  She is managed on allopurinol 100 mg daily, given the frequency of her more recent flares, she is likely subtherapeutic and would ultimately benefit from increasing dosage with PCP.  For her acute flare, we will start her on colchicine 0.6 mg once now followed by 1 hour later an additional dose tomorrow.  We did discuss treating with oral prednisone in addition, however she felt somewhat lightheaded with continued doses of oral prednisone in the past.  Given this, we did perform IM injection instead, patient tolerated well.  She may need additional NSAID use or treatment, for this I would like her to follow-up with her PCP later this week, she and her daughter are agreeable and will be able to schedule this later this week.  In the past she took indomethacin, would need clarity regarding whether she is on Eliquis  or not as it is still on her medication list for her A-fib but currently listed as an allergy.  In terms of her trigger finger, I discussed that the injection had nothing to do with her gout flare.  She has had resolution of her triggering but  still has some pain with endrange closing.  She will keep her routine appointment with Dr. Jerri, if she has reexacerbation of her symptoms, next step would be surgical release.  I will see her back as needed, or/if she cannot get in with her PCP by the week.  Follow-up: Return if symptoms worsen or fail to improve.   Meds & Orders:  Meds ordered this encounter  Medications   colchicine 0.6 MG tablet    Sig: Take 1 tablet (0.6mg ) now, followed by 2nd tablet 1-hour later. May take 3rd tablet tomorrow with food.     Dispense:  3 tablet    Refill:  0    Orders Placed This Encounter  Procedures   US  Extrem Up Left Ltd     Procedures: - After discussion on R/B/I, 40mg  IM methylprednisolone administered into the L buttock today.     Clinical History: No specialty comments available.  She reports that she has never smoked. She has never used smokeless tobacco. No results for input(s): HGBA1C, LABURIC in the last 8760 hours.  Objective:   Vital Signs: LMP  (LMP Unknown)   Physical Exam  Gen: Well-appearing, in no acute distress; non-toxic CV: Well-perfused. Warm.  Resp: Breathing unlabored on room air; no wheezing. Psych: Fluid speech in conversation; appropriate affect; normal thought process  Ortho Exam - Left hand/thumb: There is significant pain with swelling and warmth to the [facility] joint as well as the left thumb and radial side of the wrist joint.  There is tenderness with any motion here.  There is mild TTP overlying the A1 nodule of the left middle finger but no active triggering on examination today.  There is no redness or warmth to this location.  Imaging: US  Extrem Up Left Ltd Result Date: 09/23/2024 Limited musculoskeletal ultrasound of the left hand and thumb was performed today.  Ultrasound shows a small reactive effusion of the left CMC joint with notable hyperemia here as well as over the extensor tendon origin and the distal radiocarpal wrist joint.  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   Past Medical/Family/Surgical/Social History: Medications & Allergies reviewed per EMR, new medications updated. Patient Active Problem List   Diagnosis Date Noted   Trigger finger, left middle finger 09/19/2024   Status post total right knee replacement 12/06/2023   Pitting edema 12/05/2023   Primary osteoarthritis of left knee 02/23/2021   Ankle impingement syndrome, left 02/23/2021   Primary osteoarthritis of right knee 12/03/2020   Chronic  anticoagulation 03/06/2015   Edema-dopplers negative for DVT 03/06/2015   CHF exacerbation (HCC)    SOB (shortness of breath)    Chest pain    Acute on chronic diastolic congestive heart failure (HCC)    Atrial fibrillation with RVR (HCC) 03/04/2015   Essential hypertension 02/24/2015   Chronic diastolic heart failure (HCC) 12/10/2014   Permanent atrial fibrillation (HCC) 12/10/2014   Paroxysmal nocturnal dyspnea 12/10/2014   Past Medical History:  Diagnosis Date   Arthritis    Atrial fibrillation (HCC)    Atrial fibrillation with RVR (HCC) 03/04/2015   CHF (congestive heart failure) (HCC)    Hypertension    Sleep apnea    does not use cpap   Family History  Problem Relation Age of Onset   Heart failure Father    Past Surgical History:  Procedure Laterality Date   NO PAST SURGERIES     TOTAL KNEE ARTHROPLASTY Right 12/06/2023   Procedure: RIGHT TOTAL KNEE ARTHROPLASTY;  Surgeon:  Jerri Kay HERO, MD;  Location: [facility] OR;  Service: Orthopedics;  Laterality: Right;   Social History   Occupational History   Not on file  Tobacco Use   Smoking status: Never   Smokeless tobacco: Never  Vaping Use   Vaping status: Never Used  Substance and Sexual Activity   Alcohol use: No   Drug use: No   Sexual activity: Never

## 2024-09-24 ENCOUNTER — Ambulatory Visit

## 2024-09-26 ENCOUNTER — Encounter: Payer: Self-pay | Admitting: Cardiology

## 2024-09-26 ENCOUNTER — Ambulatory Visit: Attending: Cardiology | Admitting: Cardiology

## 2024-09-26 VITALS — BP 132/78 | HR 77 | Ht 62.0 in | Wt 176.0 lb

## 2024-09-26 DIAGNOSIS — I5032 Chronic diastolic (congestive) heart failure: Secondary | ICD-10-CM | POA: Diagnosis not present

## 2024-09-26 DIAGNOSIS — I10 Essential (primary) hypertension: Secondary | ICD-10-CM | POA: Insufficient documentation

## 2024-09-26 DIAGNOSIS — I4821 Permanent atrial fibrillation: Secondary | ICD-10-CM | POA: Diagnosis not present

## 2024-09-26 NOTE — Progress Notes (Signed)
 Cardiology Office Note:  .   Date:  09/26/2024  ID:  Chelsea [name], Chelsea [name] 1941-03-13, MRN [id number] PCP: Medicine, [facility]  [facility] Providers Cardiologist:  Oneil Parchment, MD Electrophysiologist:  OLE ONEIDA HOLTS, MD    History of Present Illness: .   Chelsea [name] is a 83 y.o. female Discussed the use of AI scribe   History of Present Illness Chelsea [name] is an 83 year old female with permanent atrial fibrillation, hypertension, and mitral valve regurgitation who presents for follow-up.  She has a history of permanent atrial fibrillation and is on Eliquis  5 mg twice a day for anticoagulation. She is taking metoprolol  and Cardizem  120 mg.  She has chronic diastolic heart failure with chronic 1+ pitting edema in her legs. She is currently taking Lasix  (furosemide ) 40 mg. No orthopnea is reported. There is a concern that Lasix  may be contributing to recent gout attacks.  She has a history of mild to moderate mitral valve regurgitation, as noted on an echocardiogram from 2016.  She has a dilated aorta measuring 4.3 cm on a CT scan from January 25, 2023.  Recently, she has been experiencing gout attacks, with the most recent occurring last week. She is currently taking allopurinol to manage these flares, and her primary care doctor has checked her uric acid levels.      Studies Reviewed: SABRA   EKG Interpretation Date/Time:  Thursday September 26 2024 10:54:36 EST Ventricular Rate:  77 PR Interval:    QRS Duration:  82 QT Interval:  426 QTC Calculation: 482 R Axis:   79  Text Interpretation: Atrial fibrillation When compared with ECG of 06-Dec-2022 16:37, PREVIOUS ECG IS PRESENT Confirmed by Parchment Oneil ([id number]) on 09/26/2024 11:21:18 AM    Results RADIOLOGY Aorta CT: Dilated aorta 4.3 cm (01/25/2023)  DIAGNOSTIC Echocardiogram: Mild to moderate mitral regurgitation (2016) Risk Assessment/Calculations:             Physical Exam:   VS:  BP 132/78   Pulse 77   Ht 5' 2 (1.575 m)   Wt 176 lb (79.8 kg)   LMP  (LMP Unknown)   SpO2 98%   BMI 32.19 kg/m    Wt Readings from Last 3 Encounters:  09/26/24 176 lb (79.8 kg)  12/06/23 185 lb (83.9 kg)  12/02/23 186 lb 15.2 oz (84.8 kg)    GEN: Well nourished, well developed in no acute distress NECK: No JVD; No carotid bruits CARDIAC: IRRR, no murmurs, no rubs, no gallops RESPIRATORY:  Clear to auscultation without rales, wheezing or rhonchi  ABDOMEN: Soft, non-tender, non-distended EXTREMITIES:  No edema; No deformity   ASSESSMENT AND PLAN: .    Assessment and Plan Assessment & Plan Permanent atrial fibrillation on chronic anticoagulation Permanent atrial fibrillation managed with Eliquis  5 mg twice daily. Heart rate is well-controlled with metoprolol  and Cardizem . - Continue Eliquis  5 mg twice daily - Continue metoprolol  and Cardizem  as prescribed  Chronic diastolic heart failure with lower extremity edema Chronic diastolic heart failure with persistent lower extremity edema. Managed with Lasix  40 mg. No orthopnea reported. Edema is chronic and not considered dangerous. - Continue Lasix  40 mg - Advised leg elevation to manage edema  Hypertension Managed with Cardizem  120 mg once daily. - Continue Cardizem  120 mg once daily  Mild to moderate mitral regurgitation No murmurs detected on current examination.  Stable dilated thoracic aorta Dilated thoracic aorta measuring 4.3 cm on CT scan from 2023. Advised to avoid  heavy lifting. - Continue to avoid heavy lifting  Recurrent gout Recent flare. Primary care physician involved in management. Furosemide  does not affect gout flares.  Discussed with pharmacy team.  Appreciate their assistance.  Allopurinol is being used to control flares. Uric acid levels checked recently, awaiting results. - Continue allopurinol as prescribed - Primary care physician to monitor uric acid levels and adjust  allopurinol if necessary. - Avoid HCTZ         Dispo: 1 yr  Signed, Oneil Parchment, MD

## 2024-09-26 NOTE — Patient Instructions (Signed)
 Medication Instructions:  No medication changes were made at this visit. Continue current regimen.    To enhance your experience and proactively present your provider care team with necessary information, we are requesting that you please review the medications, allergies, and pharmacy details that we have listed on file to ensure accuracy.   Your cardiology team recommends filling your prescriptions at one of our many [facility] locations. There are many benefits to using a [facility], including the quick and easy communication that occurs between your cardiologist and our expert pharmacy care team.    Please let us  know if you would like to make this switch today by calling [phone] to speak with a pharmacy team member and take advantage of all the benefits [facility] locations have to offer!   Thanks!  *If you need a refill on your cardiac medications before your next appointment, please call your pharmacy*  Lab Work: None ordered today. If you have labs (blood work) drawn today and your tests are completely normal, you will receive your results only by: MyChart Message (if you have MyChart) OR A paper copy in the mail If you have any lab test that is abnormal or we need to change your treatment, we will call you to review the results.  Testing/Procedures: None ordered today.  Follow-Up: At [facility], you and your health needs are our priority.  As part of our continuing mission to provide you with exceptional heart care, our providers are all part of one team.  This team includes your primary Cardiologist (physician) and Advanced Practice Providers or APPs (Physician Assistants and Nurse Practitioners) who all work together to provide you with the care you need, when you need it.  Your next appointment:   1 year(s)  Provider:   Oneil Parchment, MD

## 2024-09-29 NOTE — Therapy (Signed)
 OUTPATIENT PHYSICAL THERAPY SHOULDER EVALUATION   Patient Name: Chelsea [name] MRN: [id number] DOB:[birth year], 83 y.o., female Today's Date: 09/30/2024  END OF SESSION:  PT End of Session - 09/30/24 0932     Visit Number 1    Date for Recertification  11/25/24    Authorization Type MCR and Medicaid secondary    Progress Note Due on Visit 10    PT Start Time 0932    PT Stop Time 1017    PT Time Calculation (min) 45 min    Activity Tolerance Patient tolerated treatment well;Patient limited by pain    Behavior During Therapy [facility] for tasks assessed/performed          Past Medical History:  Diagnosis Date   Arthritis    Atrial fibrillation (HCC)    Atrial fibrillation with RVR (HCC) 03/04/2015   CHF (congestive heart failure) (HCC)    Hypertension    Sleep apnea    does not use cpap   Past Surgical History:  Procedure Laterality Date   NO PAST SURGERIES     TOTAL KNEE ARTHROPLASTY Right 12/06/2023   Procedure: RIGHT TOTAL KNEE ARTHROPLASTY;  Surgeon: Jerri Kay HERO, MD;  Location: [facility] OR;  Service: Orthopedics;  Laterality: Right;   Patient Active Problem List   Diagnosis Date Noted   Trigger finger, left middle finger 09/19/2024   Status post total right knee replacement 12/06/2023   Pitting edema 12/05/2023   Primary osteoarthritis of left knee 02/23/2021   Ankle impingement syndrome, left 02/23/2021   Primary osteoarthritis of right knee 12/03/2020   Chronic anticoagulation 03/06/2015   Edema-dopplers negative for DVT 03/06/2015   CHF exacerbation (HCC)    SOB (shortness of breath)    Chest pain    Acute on chronic diastolic congestive heart failure (HCC)    Atrial fibrillation with RVR (HCC) 03/04/2015   Essential hypertension 02/24/2015   Chronic diastolic heart failure (HCC) 12/10/2014   Permanent atrial fibrillation (HCC) 12/10/2014   Paroxysmal nocturnal dyspnea 12/10/2014    PCP: Medicine, [facility]   REFERRING PROVIDER: Jerri Kay HERO, MD   REFERRING DIAG:  [phone] (ICD-10-CM) - Chronic right shoulder pain  Z96.651 (ICD-10-CM) - Status post total right knee replacement  PT eval and treat right proximal humerus fracture. ROM and strengthening     THERAPY DIAG:  Acute pain of right shoulder  Stiffness of right shoulder, not elsewhere classified  Cramp and spasm  Muscle weakness (generalized)  Rationale for Evaluation and Treatment: Rehabilitation  ONSET DATE: September 2025  SUBJECTIVE:  SUBJECTIVE STATEMENT: I cannot reach behind my back or overhead.  Patient fell getting out of a car while in [place] and fractured her Right proximal humerus. Also had R TKR 10 months ago. My knee is doing okay. Hand dominance: Right  PERTINENT HISTORY: R TKR, L hand pain, Afib, CHF, HTN  PAIN:  Are you having pain? Yes: NPRS scale: 6-7 Pain location: mid R arm up to shoulder Pain description: ache Aggravating factors: moving arm behind back and OH Relieving factors: rest, exercise  PRECAUTIONS: None  RED FLAGS: None   WEIGHT BEARING RESTRICTIONS: No  FALLS:  Has patient fallen in last 6 months? Yes. Number of falls 1  LIVING ENVIRONMENT: Lives with: lives with their family Lives in: House/apartment   OCCUPATION: retired  PLOF: Independent  PATIENT GOALS:get better motion and decrease pain  NEXT MD VISIT: none scheduled  OBJECTIVE:  Note: Objective measures were completed at Evaluation unless otherwise noted.  DIAGNOSTIC FINDINGS:  09/10/24 Xrays show subacute healing proximal humerus fracture in acceptable alignment.   Shoulder X-ray:  Subacute healing proximal humerus fracture (07/27/2024)   PATIENT SURVEYS:   Quick Dash 54.5 / 100 = 54.5 %    COGNITION: Overall cognitive status: Within  functional limits for tasks assessed     SENSATION: WFL  POSTURE: Elevated R shoulder  UPPER EXTREMITY ROM:   A/P  ROM Right eval Left eval  Shoulder flexion 127*   Shoulder extension 25* 47  Shoulder abduction 75*   Shoulder adduction    Shoulder internal rotation 25*/37 T8  Shoulder external rotation 43*   Elbow flexion    Elbow extension WFL *   (Blank rows = not tested)  UPPER EXTREMITY MMT:  MMT Right eval Left eval  Shoulder flexion 2+   Shoulder extension 3+   Shoulder abduction 2+   Shoulder adduction    Shoulder internal rotation 4*   Shoulder external rotation 4-*   Middle trapezius    Lower trapezius    Elbow flexion 4   Elbow extension    Grip strength (lbs)    (Blank rows = not tested)   PALPATION:  Palpation: TTP at R UT, parascapular muscles, biceps. Increased tissue tension in R UT                                                                                                                               TREATMENT DATE:  09/30/24  See pt ed and HEP   PATIENT EDUCATION: Education details: PT eval findings, anticipated POC, initial HEP, and discussed with patient not to push her exercises into pain.   Person educated: Patient Education method: Explanation, Demonstration, Tactile cues, Verbal cues, and Handouts Education comprehension: verbalized understanding and returned demonstration  HOME EXERCISE PROGRAM: Access Code: [email or web address] URL: [email or web address] Date: 09/30/2024 Prepared by: Mliss  Exercises - Standing Scapular Retraction  - 3 x daily - 7 x weekly - 1 sets - 10 reps - 10 hold -  Standing Scapular Depression  - 3 x daily - 7 x weekly - 1 sets - 10 reps - Shoulder External Rotation and Scapular Retraction  - 3 x daily - 7 x weekly - 2 sets - 10 reps -   hold - Standing Shoulder Extension with Dowel  - 3 x daily - 7 x weekly - 2 sets - 10 reps - Supine Shoulder Flexion Extension AAROM with Dowel  - 3 x daily - 7  x weekly - 2 sets - 10 reps  ASSESSMENT:  CLINICAL IMPRESSION: Patient is a 83 y.o. female who was seen today for physical therapy evaluation and treatment for R shoulder pain s/p a proximal humerus after a fall in [place] in October 2025. She presents with decreased ROM, strength and flexibility in the R shoulder and upper quadrant affecting her ability to perform ADLS including fixing her hair and head scarf, reaching into cabinets, reaching behind her back.  She has significant musculature tension in the right upper traps and her right shoulder stays elevated due to guarding and compensation. She has significant pain with all end range movements. She will benefit from skilled PT to address these deficits and those listed below.    OBJECTIVE IMPAIRMENTS: decreased activity tolerance, decreased ROM, decreased strength, increased muscle spasms, impaired flexibility, impaired UE functional use, postural dysfunction, and pain.   ACTIVITY LIMITATIONS: carrying, lifting, sleeping, bathing, toileting, dressing, reach over head, hygiene/grooming, and caring for others  PARTICIPATION LIMITATIONS: meal prep, cleaning, laundry, driving, shopping, community activity, and church  PERSONAL FACTORS: Age, Behavior pattern, Fitness, Time since onset of injury/illness/exacerbation, and 3+ comorbidities: Afib, HTN CHF are also affecting patient's functional outcome.   REHAB POTENTIAL: Excellent  CLINICAL DECISION MAKING: Evolving/moderate complexity  EVALUATION COMPLEXITY: Moderate   GOALS: Goals reviewed with patient? Yes  SHORT TERM GOALS: Target date: 10/28/24  Pain report to be no greater than 4/10  Baseline: Goal status: INITIAL  2.  Patient will be independent with initial HEP  Baseline:  Goal status: INITIAL    LONG TERM GOALS: Target date: 11/25/24  Patient to report pain no greater than 2/10 with ADLS Baseline:  Goal status: INITIAL  2.  Patient to be independent with advanced HEP   Baseline:  Goal status: INITIAL  3.  Patient to be able to don/doff her jacket without difficulty  Baseline:  Goal status: INITIAL  4.  Patient will be able to reach overhead into cabinets and on top of shelves without pain  Baseline:  Goal status: INITIAL  5.  Patient to be able to do all ADL's and IADL's independently with 4 to 4+/5 strength in the shoulder. Baseline:  Goal status: INITIAL  6.  Quick Dash score to improve by 4-6 points Baseline: 54.5 / 100 = 54.5 % Goal status: INITIAL   PLAN:  PT FREQUENCY: 2x/week  PT DURATION: 8 weeks  PLANNED INTERVENTIONS: 97110-Therapeutic exercises, 97530- Therapeutic activity, 97112- Neuromuscular re-education, 97535- Self Care, 02859- Manual therapy, [phone]- Aquatic Therapy, [phone]- Electrical stimulation (unattended), 97016- Vasopneumatic device, [id number]- Ionotophoresis 4mg /ml Dexamethasone , 79439 (1-2 muscles), 20561 (3+ muscles)- Dry Needling, Patient/Family education, Balance training, Taping, Joint mobilization, Spinal mobilization, Scar mobilization, Cryotherapy, and Moist heat  PLAN FOR NEXT SESSION: Review and progress HEP, R shoulder ROM, strengthening, scapular mobs, manual/DN to R MARDEEN Mliss Cummins, PT 09/30/24 5:08 PM

## 2024-09-30 ENCOUNTER — Other Ambulatory Visit: Payer: Self-pay

## 2024-09-30 ENCOUNTER — Encounter: Payer: Self-pay | Admitting: Physical Therapy

## 2024-09-30 ENCOUNTER — Ambulatory Visit: Attending: Orthopaedic Surgery | Admitting: Physical Therapy

## 2024-09-30 DIAGNOSIS — M25611 Stiffness of right shoulder, not elsewhere classified: Secondary | ICD-10-CM | POA: Diagnosis present

## 2024-09-30 DIAGNOSIS — M6281 Muscle weakness (generalized): Secondary | ICD-10-CM | POA: Insufficient documentation

## 2024-09-30 DIAGNOSIS — M25511 Pain in right shoulder: Secondary | ICD-10-CM | POA: Diagnosis present

## 2024-09-30 DIAGNOSIS — R252 Cramp and spasm: Secondary | ICD-10-CM | POA: Insufficient documentation

## 2024-09-30 DIAGNOSIS — Z96651 Presence of right artificial knee joint: Secondary | ICD-10-CM | POA: Insufficient documentation

## 2024-09-30 DIAGNOSIS — G8929 Other chronic pain: Secondary | ICD-10-CM | POA: Diagnosis not present

## 2024-10-01 ENCOUNTER — Ambulatory Visit: Admitting: Physical Therapy

## 2024-10-02 ENCOUNTER — Ambulatory Visit: Admitting: Rehabilitative and Restorative Service Providers"

## 2024-10-02 ENCOUNTER — Encounter: Payer: Self-pay | Admitting: Rehabilitative and Restorative Service Providers"

## 2024-10-02 DIAGNOSIS — R252 Cramp and spasm: Secondary | ICD-10-CM

## 2024-10-02 DIAGNOSIS — M25511 Pain in right shoulder: Secondary | ICD-10-CM

## 2024-10-02 DIAGNOSIS — M6281 Muscle weakness (generalized): Secondary | ICD-10-CM

## 2024-10-02 DIAGNOSIS — M25611 Stiffness of right shoulder, not elsewhere classified: Secondary | ICD-10-CM

## 2024-10-02 NOTE — Therapy (Signed)
 OUTPATIENT PHYSICAL THERAPY TREATMENT NOTE   Patient Name: Chelsea [name] MRN: [id number] DOB:[birth year], 83 y.o., female Today's Date: 10/02/2024  END OF SESSION:  PT End of Session - 10/02/24 1117     Visit Number 2    Date for Recertification  11/25/24    Authorization Type MCR and Medicaid secondary    Progress Note Due on Visit 10    PT Start Time 1114   Pt arrived late for appointment   PT Stop Time 1140    PT Time Calculation (min) 26 min    Activity Tolerance Patient tolerated treatment well    Behavior During Therapy [facility] for tasks assessed/performed          Past Medical History:  Diagnosis Date   Arthritis    Atrial fibrillation (HCC)    Atrial fibrillation with RVR (HCC) 03/04/2015   CHF (congestive heart failure) (HCC)    Hypertension    Sleep apnea    does not use cpap   Past Surgical History:  Procedure Laterality Date   NO PAST SURGERIES     TOTAL KNEE ARTHROPLASTY Right 12/06/2023   Procedure: RIGHT TOTAL KNEE ARTHROPLASTY;  Surgeon: Jerri Kay HERO, MD;  Location: [facility] OR;  Service: Orthopedics;  Laterality: Right;   Patient Active Problem List   Diagnosis Date Noted   Trigger finger, left middle finger 09/19/2024   Status post total right knee replacement 12/06/2023   Pitting edema 12/05/2023   Primary osteoarthritis of left knee 02/23/2021   Ankle impingement syndrome, left 02/23/2021   Primary osteoarthritis of right knee 12/03/2020   Chronic anticoagulation 03/06/2015   Edema-dopplers negative for DVT 03/06/2015   CHF exacerbation (HCC)    SOB (shortness of breath)    Chest pain    Acute on chronic diastolic congestive heart failure (HCC)    Atrial fibrillation with RVR (HCC) 03/04/2015   Essential hypertension 02/24/2015   Chronic diastolic heart failure (HCC) 12/10/2014   Permanent atrial fibrillation (HCC) 12/10/2014   Paroxysmal nocturnal dyspnea 12/10/2014    PCP: Medicine, [facility]   REFERRING PROVIDER:  Jerri Kay HERO, MD   REFERRING DIAG:  [phone] (ICD-10-CM) - Chronic right shoulder pain  Z96.651 (ICD-10-CM) - Status post total right knee replacement  PT eval and treat right proximal humerus fracture. ROM and strengthening     THERAPY DIAG:  Acute pain of right shoulder  Stiffness of right shoulder, not elsewhere classified  Cramp and spasm  Muscle weakness (generalized)  Rationale for Evaluation and Treatment: Rehabilitation  ONSET DATE: September 2025  SUBJECTIVE:  SUBJECTIVE STATEMENT: Patient denies any pain today.  Hand dominance: Right  PERTINENT HISTORY: Patient fell getting out of a car while in [place] and fractured her Right proximal humerus. Also had R TKR 10 months ago.  R TKR, L hand pain, Afib, CHF, HTN  PAIN:  Are you having pain? Yes: NPRS scale: 0/10 Pain location: mid R arm up to shoulder Pain description: ache Aggravating factors: moving arm behind back and [place] Relieving factors: rest, exercise  PRECAUTIONS: None  RED FLAGS: None   WEIGHT BEARING RESTRICTIONS: No  FALLS:  Has patient fallen in last 6 months? Yes. Number of falls 1  LIVING ENVIRONMENT: Lives with: lives with their family Lives in: House/apartment   OCCUPATION: retired  PLOF: Independent  PATIENT GOALS:get better motion and decrease pain  NEXT MD VISIT: none scheduled  OBJECTIVE:  Note: Objective measures were completed at Evaluation unless otherwise noted.  DIAGNOSTIC FINDINGS:  09/10/24 Xrays show subacute healing proximal humerus fracture in acceptable alignment.   Shoulder X-ray:  Subacute healing proximal humerus fracture (07/27/2024)   PATIENT SURVEYS:   Quick Dash 54.5 / 100 = 54.5 %    COGNITION: Overall cognitive status: Within functional limits for tasks  assessed     SENSATION: WFL  POSTURE: Elevated R shoulder  UPPER EXTREMITY ROM:   A/P  ROM Right eval Left eval  Shoulder flexion 127*   Shoulder extension 25* 47  Shoulder abduction 75*   Shoulder adduction    Shoulder internal rotation 25*/37 T8  Shoulder external rotation 43*   Elbow flexion    Elbow extension WFL *   (Blank rows = not tested)  UPPER EXTREMITY MMT:  MMT Right eval Left eval  Shoulder flexion 2+   Shoulder extension 3+   Shoulder abduction 2+   Shoulder adduction    Shoulder internal rotation 4*   Shoulder external rotation 4-*   Middle trapezius    Lower trapezius    Elbow flexion 4   Elbow extension    Grip strength (lbs)    (Blank rows = not tested)   PALPATION:  Palpation: TTP at R UT, parascapular muscles, biceps. Increased tissue tension in R UT                                                                                                                               TREATMENT DATE:   10/02/2024: Nustep level 4 x5 min with PT present to discuss status Seated scapular retraction x10 Standing shoulder flexion wall wash 2x10 right UE Standing shoulder scaption wall wash 2x10 right UE Standing rows with yellow tband 2x10 Standing shoulder extension with yellow tband 2x10 Standing right shoulder IR with yellow tband 2x10 Standing right shoulder ER with yellow tband 2x10 Attempted standing shoulder flexion with dowel rod, but unable to complete without compensations Standing shoulder extension with dowel rod 2x10 Supine shoulder flexion with dowel rod 2x10 Supine serratus punch with 1# dumbbell x15 bilat Standing  right shoulder flexion with hand up the stair railing x10   09/30/24   See pt ed and HEP   PATIENT EDUCATION: Education details: PT eval findings, anticipated POC, initial HEP, and discussed with patient not to push her exercises into pain.   Person educated: Patient Education method: Explanation, Demonstration,  Tactile cues, Verbal cues, and Handouts Education comprehension: verbalized understanding and returned demonstration  HOME EXERCISE PROGRAM: Access Code: [email or web address] URL: [email or web address] Date: 09/30/2024 Prepared by: Chelsea [name]  Exercises - Standing Scapular Retraction  - 3 x daily - 7 x weekly - 1 sets - 10 reps - 10 hold - Standing Scapular Depression  - 3 x daily - 7 x weekly - 1 sets - 10 reps - Shoulder External Rotation and Scapular Retraction  - 3 x daily - 7 x weekly - 2 sets - 10 reps -   hold - Standing Shoulder Extension with Dowel  - 3 x daily - 7 x weekly - 2 sets - 10 reps - Supine Shoulder Flexion Extension AAROM with Dowel  - 3 x daily - 7 x weekly - 2 sets - 10 reps  ASSESSMENT:  CLINICAL IMPRESSION:  Chelsea [name] presents to skilled PT denying any pain.  She states that her knee is doing well today and denies shoulder pain at rest.  Patient requires verbal and tactile cuing during exercises for improved posture/form.  Patient unable to complete shoulder flexion in standing with dowel rod without compensations, but able to perform in supine.  Patient with difficulty coordinating movement with supine serratus punch and required tactile cuing.  Patient continues to require skilled PT to progress towards goal related activities.  OBJECTIVE IMPAIRMENTS: decreased activity tolerance, decreased ROM, decreased strength, increased muscle spasms, impaired flexibility, impaired UE functional use, postural dysfunction, and pain.   ACTIVITY LIMITATIONS: carrying, lifting, sleeping, bathing, toileting, dressing, reach over head, hygiene/grooming, and caring for others  PARTICIPATION LIMITATIONS: meal prep, cleaning, laundry, driving, shopping, community activity, and church  PERSONAL FACTORS: Age, Behavior pattern, Fitness, Time since onset of injury/illness/exacerbation, and 3+ comorbidities: Afib, HTN CHF are also affecting patient's functional outcome.   REHAB POTENTIAL:  Excellent  CLINICAL DECISION MAKING: Evolving/moderate complexity  EVALUATION COMPLEXITY: Moderate   GOALS: Goals reviewed with patient? Yes  SHORT TERM GOALS: Target date: 10/28/24  Pain report to be no greater than 4/10  Baseline: Goal status: Ongoing  2.  Patient will be independent with initial HEP  Baseline:  Goal status: INITIAL    LONG TERM GOALS: Target date: 11/25/24  Patient to report pain no greater than 2/10 with ADLS Baseline:  Goal status: INITIAL  2.  Patient to be independent with advanced HEP  Baseline:  Goal status: INITIAL  3.  Patient to be able to don/doff her jacket without difficulty  Baseline:  Goal status: INITIAL  4.  Patient will be able to reach overhead into cabinets and on top of shelves without pain  Baseline:  Goal status: INITIAL  5.  Patient to be able to do all ADL's and IADL's independently with 4 to 4+/5 strength in the shoulder. Baseline:  Goal status: INITIAL  6.  Quick Dash score to improve by 4-6 points Baseline: 54.5 / 100 = 54.5 % Goal status: INITIAL   PLAN:  PT FREQUENCY: 2x/week  PT DURATION: 8 weeks  PLANNED INTERVENTIONS: 97110-Therapeutic exercises, 97530- Therapeutic activity, [id number]- Neuromuscular re-education, 97535- Self Care, 02859- Manual therapy, [id number]- Aquatic Therapy, H9716- Electrical stimulation (unattended), 97016- Vasopneumatic device, [id number]- Ionotophoresis 4mg /ml Dexamethasone , 79439 (  1-2 muscles), 20561 (3+ muscles)- Dry Needling, Patient/Family education, Balance training, Taping, Joint mobilization, Spinal mobilization, Scar mobilization, Cryotherapy, and Moist heat  PLAN FOR NEXT SESSION: Review and progress HEP, R shoulder ROM, strengthening, scapular mobs, manual/DN to Chelsea [name] Jarrell Maxine, PT, DPT 10/02/24, 12:48 PM  [facility] [address] [place], [place] [address] Phone # [phone] Fax [phone]

## 2024-10-07 ENCOUNTER — Ambulatory Visit: Admitting: Physical Therapy

## 2024-10-07 ENCOUNTER — Encounter: Payer: Self-pay | Admitting: Physical Therapy

## 2024-10-07 DIAGNOSIS — R252 Cramp and spasm: Secondary | ICD-10-CM | POA: Diagnosis present

## 2024-10-07 DIAGNOSIS — R293 Abnormal posture: Secondary | ICD-10-CM | POA: Insufficient documentation

## 2024-10-07 DIAGNOSIS — R262 Difficulty in walking, not elsewhere classified: Secondary | ICD-10-CM | POA: Insufficient documentation

## 2024-10-07 DIAGNOSIS — M25611 Stiffness of right shoulder, not elsewhere classified: Secondary | ICD-10-CM | POA: Insufficient documentation

## 2024-10-07 DIAGNOSIS — M25511 Pain in right shoulder: Secondary | ICD-10-CM | POA: Insufficient documentation

## 2024-10-07 DIAGNOSIS — M6281 Muscle weakness (generalized): Secondary | ICD-10-CM | POA: Insufficient documentation

## 2024-10-07 DIAGNOSIS — M25561 Pain in right knee: Secondary | ICD-10-CM | POA: Diagnosis present

## 2024-10-07 NOTE — Therapy (Signed)
 OUTPATIENT PHYSICAL THERAPY TREATMENT NOTE   Patient Name: Chelsea [name] MRN: [id number] DOB:[birth year], 83 y.o., female Today's Date: 10/07/2024  END OF SESSION:  PT End of Session - 10/07/24 1145     Visit Number 3    Date for Recertification  11/25/24    Authorization Type MCR and Medicaid secondary    Progress Note Due on Visit 10    PT Start Time 1102    PT Stop Time 1141    PT Time Calculation (min) 39 min    Activity Tolerance Patient tolerated treatment well    Behavior During Therapy [facility] for tasks assessed/performed           Past Medical History:  Diagnosis Date   Arthritis    Atrial fibrillation (HCC)    Atrial fibrillation with RVR (HCC) 03/04/2015   CHF (congestive heart failure) (HCC)    Hypertension    Sleep apnea    does not use cpap   Past Surgical History:  Procedure Laterality Date   NO PAST SURGERIES     TOTAL KNEE ARTHROPLASTY Right 12/06/2023   Procedure: RIGHT TOTAL KNEE ARTHROPLASTY;  Surgeon: Jerri Kay HERO, MD;  Location: [facility] OR;  Service: Orthopedics;  Laterality: Right;   Patient Active Problem List   Diagnosis Date Noted   Trigger finger, left middle finger 09/19/2024   Status post total right knee replacement 12/06/2023   Pitting edema 12/05/2023   Primary osteoarthritis of left knee 02/23/2021   Ankle impingement syndrome, left 02/23/2021   Primary osteoarthritis of right knee 12/03/2020   Chronic anticoagulation 03/06/2015   Edema-dopplers negative for DVT 03/06/2015   CHF exacerbation (HCC)    SOB (shortness of breath)    Chest pain    Acute on chronic diastolic congestive heart failure (HCC)    Atrial fibrillation with RVR (HCC) 03/04/2015   Essential hypertension 02/24/2015   Chronic diastolic heart failure (HCC) 12/10/2014   Permanent atrial fibrillation (HCC) 12/10/2014   Paroxysmal nocturnal dyspnea 12/10/2014    PCP: Medicine, [facility]   REFERRING PROVIDER: Jerri Kay HERO,  MD   REFERRING DIAG:  [phone] (ICD-10-CM) - Chronic right shoulder pain  Z96.651 (ICD-10-CM) - Status post total right knee replacement  PT eval and treat right proximal humerus fracture. ROM and strengthening     THERAPY DIAG:  Acute pain of right shoulder  Stiffness of right shoulder, not elsewhere classified  Cramp and spasm  Muscle weakness (generalized)  Rationale for Evaluation and Treatment: Rehabilitation  ONSET DATE: September 2025  SUBJECTIVE:  SUBJECTIVE STATEMENT: Patient reports she is doing good today. No pain and she has been doing her exercises in the morning.  Hand dominance: Right  PERTINENT HISTORY: Patient fell getting out of a car while in [place] and fractured her Right proximal humerus. Also had R TKR 10 months ago.  R TKR, L hand pain, Afib, CHF, HTN  PAIN:  Are you having pain? Yes: NPRS scale: 0/10 Pain location: mid R arm up to shoulder Pain description: ache Aggravating factors: moving arm behind back and OH Relieving factors: rest, exercise  PRECAUTIONS: None  RED FLAGS: None   WEIGHT BEARING RESTRICTIONS: No  FALLS:  Has patient fallen in last 6 months? Yes. Number of falls 1  LIVING ENVIRONMENT: Lives with: lives with their family Lives in: House/apartment   OCCUPATION: retired  PLOF: Independent  PATIENT GOALS:get better motion and decrease pain  NEXT MD VISIT: none scheduled  OBJECTIVE:  Note: Objective measures were completed at Evaluation unless otherwise noted.  DIAGNOSTIC FINDINGS:  09/10/24 Xrays show subacute healing proximal humerus fracture in acceptable alignment.   Shoulder X-ray:  Subacute healing proximal humerus fracture (07/27/2024)   PATIENT SURVEYS:   Quick Dash 54.5 / 100 = 54.5 %    COGNITION: Overall  cognitive status: Within functional limits for tasks assessed     SENSATION: WFL  POSTURE: Elevated R shoulder  UPPER EXTREMITY ROM:   A/P  ROM Right eval Left eval  Shoulder flexion 127*   Shoulder extension 25* 47  Shoulder abduction 75*   Shoulder adduction    Shoulder internal rotation 25*/37 T8  Shoulder external rotation 43*   Elbow flexion    Elbow extension WFL *   (Blank rows = not tested)  UPPER EXTREMITY MMT:  MMT Right eval Left eval  Shoulder flexion 2+   Shoulder extension 3+   Shoulder abduction 2+   Shoulder adduction    Shoulder internal rotation 4*   Shoulder external rotation 4-*   Middle trapezius    Lower trapezius    Elbow flexion 4   Elbow extension    Grip strength (lbs)    (Blank rows = not tested)   PALPATION:  Palpation: TTP at R UT, parascapular muscles, biceps. Increased tissue tension in R UT                                                                                                                               TREATMENT DATE:  10/07/2024: Nustep level 4 x5 min with PT present to discuss status Standing scapular retraction x10 Standing shoulder flexion at finger ladder x4 (level 28) Standing shoulder scaption at finger ladder x 4 (level 25) Standing rows with yellow tband 2x10 Standing extension with yellow TB 2 x 10 Standing right shoulder IR with yellow tband 2x10 Standing right shoulder ER with yellow tband 2x10 (visual and tactile cues to prevent compensations) Shoulder pulley (flexion and abduction) 2 mins each Shoulder counter pulley (set  up on triceps extension ) - flexion, scaption, abduction 3# AW x 12 each direction (she likes this one) Standing shoulder flexion (PT providing tactile cues to decrease compensation & encourage scapular movement) Shoulder flexion stretch at counter x 5 with 3 sec hold Attempted cabinet reach to 1st level but patient had increased pain Standing right shoulder flexion with hand up  the stair railing x10 Standing shoulder flexion wall wash x8 right UE Standing shoulder scaption wall wash x8 right UE  10/02/2024: Nustep level 4 x5 min with PT present to discuss status Seated scapular retraction x10 Standing shoulder flexion wall wash 2x10 right UE Standing shoulder scaption wall wash 2x10 right UE Standing rows with yellow tband 2x10 Standing shoulder extension with yellow tband 2x10 Standing right shoulder IR with yellow tband 2x10 Standing right shoulder ER with yellow tband 2x10 Attempted standing shoulder flexion with dowel rod, but unable to complete without compensations Standing shoulder extension with dowel rod 2x10 Supine shoulder flexion with dowel rod 2x10 Supine serratus punch with 1# dumbbell x15 bilat Standing right shoulder flexion with hand up the stair railing x10   09/30/24   See pt ed and HEP   PATIENT EDUCATION: Education details: PT eval findings, anticipated POC, initial HEP, and discussed with patient not to push her exercises into pain.   Person educated: Patient Education method: Explanation, Demonstration, Tactile cues, Verbal cues, and Handouts Education comprehension: verbalized understanding and returned demonstration  HOME EXERCISE PROGRAM: Access Code: [email or web address] URL: [email or web address] Date: 09/30/2024 Prepared by: Mliss  Exercises - Standing Scapular Retraction  - 3 x daily - 7 x weekly - 1 sets - 10 reps - 10 hold - Standing Scapular Depression  - 3 x daily - 7 x weekly - 1 sets - 10 reps - Shoulder External Rotation and Scapular Retraction  - 3 x daily - 7 x weekly - 2 sets - 10 reps -   hold - Standing Shoulder Extension with Dowel  - 3 x daily - 7 x weekly - 2 sets - 10 reps - Supine Shoulder Flexion Extension AAROM with Dowel  - 3 x daily - 7 x weekly - 2 sets - 10 reps  ASSESSMENT:  CLINICAL IMPRESSION: Chelsea [name] presents to skilled therapy verbalizing no pain and compliance to HEP. Treatment  session focused on active assisted shoulder motions and periscapular strengthening. Patient required visual and tactile cues to prevent compensations when performing shoulder ER exercises. She verbalized increased pain with cabinet reach exercise, but she enjoyed the counter pulley exercise for strengthening. Overall, she tolerated treatment session well and demonstrates a need for skilled therapy.  OBJECTIVE IMPAIRMENTS: decreased activity tolerance, decreased ROM, decreased strength, increased muscle spasms, impaired flexibility, impaired UE functional use, postural dysfunction, and pain.   ACTIVITY LIMITATIONS: carrying, lifting, sleeping, bathing, toileting, dressing, reach over head, hygiene/grooming, and caring for others  PARTICIPATION LIMITATIONS: meal prep, cleaning, laundry, driving, shopping, community activity, and church  PERSONAL FACTORS: Age, Behavior pattern, Fitness, Time since onset of injury/illness/exacerbation, and 3+ comorbidities: Afib, HTN CHF are also affecting patient's functional outcome.   REHAB POTENTIAL: Excellent  CLINICAL DECISION MAKING: Evolving/moderate complexity  EVALUATION COMPLEXITY: Moderate   GOALS: Goals reviewed with patient? Yes  SHORT TERM GOALS: Target date: 10/28/24  Pain report to be no greater than 4/10  Baseline: Goal status: Ongoing  2.  Patient will be independent with initial HEP  Baseline:  Goal status: INITIAL    LONG TERM GOALS: Target date: 11/25/24  Patient to report pain  no greater than 2/10 with ADLS Baseline:  Goal status: INITIAL  2.  Patient to be independent with advanced HEP  Baseline:  Goal status: INITIAL  3.  Patient to be able to don/doff her jacket without difficulty  Baseline:  Goal status: INITIAL  4.  Patient will be able to reach overhead into cabinets and on top of shelves without pain  Baseline:  Goal status: INITIAL  5.  Patient to be able to do all ADL's and IADL's independently with 4 to  4+/5 strength in the shoulder. Baseline:  Goal status: INITIAL  6.  Quick Dash score to improve by 4-6 points Baseline: 54.5 / 100 = 54.5 % Goal status: INITIAL   PLAN:  PT FREQUENCY: 2x/week  PT DURATION: 8 weeks  PLANNED INTERVENTIONS: 97110-Therapeutic exercises, 97530- Therapeutic activity, 97112- Neuromuscular re-education, 97535- Self Care, 02859- Manual therapy, [phone]- Aquatic Therapy, [phone]- Electrical stimulation (unattended), 97016- Vasopneumatic device, [id number]- Ionotophoresis 4mg /ml Dexamethasone , 79439 (1-2 muscles), 20561 (3+ muscles)- Dry Needling, Patient/Family education, Balance training, Taping, Joint mobilization, Spinal mobilization, Scar mobilization, Cryotherapy, and Moist heat  PLAN FOR NEXT SESSION: assess tolerance to treatment session; update HEP, R shoulder ROM, strengthening, scapular mobs, manual/DN to JONELLE MARDEEN Kristeen Janit, PT, DPT 10/07/24 11:46 AM [facility] [address] [place], [place] [address] Phone # [phone] Fax [phone]

## 2024-10-09 NOTE — Therapy (Signed)
 OUTPATIENT PHYSICAL THERAPY TREATMENT NOTE   Patient Name: Chelsea [name] MRN: [id number] DOB:Mar 24, 1941, 83 y.o., female Today's Date: 10/10/2024  END OF SESSION:  PT End of Session - 10/10/24 0849     Visit Number 4    Date for Recertification  11/25/24    Authorization Type MCR and Medicaid secondary    Progress Note Due on Visit 10    PT Start Time 0848    PT Stop Time 0929    PT Time Calculation (min) 41 min    Activity Tolerance Patient tolerated treatment well    Behavior During Therapy [facility] for tasks assessed/performed            Past Medical History:  Diagnosis Date   Arthritis    Atrial fibrillation (HCC)    Atrial fibrillation with RVR (HCC) 03/04/2015   CHF (congestive heart failure) (HCC)    Hypertension    Sleep apnea    does not use cpap   Past Surgical History:  Procedure Laterality Date   NO PAST SURGERIES     TOTAL KNEE ARTHROPLASTY Right 12/06/2023   Procedure: RIGHT TOTAL KNEE ARTHROPLASTY;  Surgeon: Jerri Kay HERO, MD;  Location: [facility] OR;  Service: Orthopedics;  Laterality: Right;   Patient Active Problem List   Diagnosis Date Noted   Trigger finger, left middle finger 09/19/2024   Status post total right knee replacement 12/06/2023   Pitting edema 12/05/2023   Primary osteoarthritis of left knee 02/23/2021   Ankle impingement syndrome, left 02/23/2021   Primary osteoarthritis of right knee 12/03/2020   Chronic anticoagulation 03/06/2015   Edema-dopplers negative for DVT 03/06/2015   CHF exacerbation (HCC)    SOB (shortness of breath)    Chest pain    Acute on chronic diastolic congestive heart failure (HCC)    Atrial fibrillation with RVR (HCC) 03/04/2015   Essential hypertension 02/24/2015   Chronic diastolic heart failure (HCC) 12/10/2014   Permanent atrial fibrillation (HCC) 12/10/2014   Paroxysmal nocturnal dyspnea 12/10/2014    PCP: Medicine, [facility]   REFERRING PROVIDER: Jerri Kay HERO,  MD   REFERRING DIAG:  [phone] (ICD-10-CM) - Chronic right shoulder pain  Z96.651 (ICD-10-CM) - Status post total right knee replacement  PT eval and treat right proximal humerus fracture. ROM and strengthening     THERAPY DIAG:  Acute pain of right shoulder  Stiffness of right shoulder, not elsewhere classified  Cramp and spasm  Muscle weakness (generalized)  Acute pain of right knee  Difficulty in walking, not elsewhere classified  Rationale for Evaluation and Treatment: Rehabilitation  ONSET DATE: September 2025  SUBJECTIVE:  SUBJECTIVE STATEMENT: Monday my shoulder was hurting after therapy and I was very very tired.  Hand dominance: Right  PERTINENT HISTORY: Patient fell getting out of a car while in [place] and fractured her Right proximal humerus. Also had R TKR 10 months ago.  R TKR, L hand pain, Afib, CHF, HTN  PAIN:  Are you having pain? Yes: NPRS scale: 0/10 Pain location: mid R arm up to shoulder Pain description: ache Aggravating factors: moving arm behind back and OH Relieving factors: rest, exercise  PRECAUTIONS: None  RED FLAGS: None   WEIGHT BEARING RESTRICTIONS: No  FALLS:  Has patient fallen in last 6 months? Yes. Number of falls 1  LIVING ENVIRONMENT: Lives with: lives with their family Lives in: House/apartment   OCCUPATION: retired  PLOF: Independent  PATIENT GOALS:get better motion and decrease pain  NEXT MD VISIT: none scheduled  OBJECTIVE:  Note: Objective measures were completed at Evaluation unless otherwise noted.  DIAGNOSTIC FINDINGS:  09/10/24 Xrays show subacute healing proximal humerus fracture in acceptable alignment.   Shoulder X-ray:  Subacute healing proximal humerus fracture (07/27/2024)   PATIENT SURVEYS:   Quick  Dash 54.5 / 100 = 54.5 %    COGNITION: Overall cognitive status: Within functional limits for tasks assessed     SENSATION: WFL  POSTURE: Elevated R shoulder  UPPER EXTREMITY ROM:   A/P  ROM Right eval Left eval  Shoulder flexion 127*   Shoulder extension 25* 47  Shoulder abduction 75*   Shoulder adduction    Shoulder internal rotation 25*/37 T8  Shoulder external rotation 43*   Elbow flexion    Elbow extension WFL *   (Blank rows = not tested)  UPPER EXTREMITY MMT:  MMT Right eval Left eval  Shoulder flexion 2+   Shoulder extension 3+   Shoulder abduction 2+   Shoulder adduction    Shoulder internal rotation 4*   Shoulder external rotation 4-*   Middle trapezius    Lower trapezius    Elbow flexion 4   Elbow extension    Grip strength (lbs)    (Blank rows = not tested)   PALPATION:  Palpation: TTP at R UT, parascapular muscles, biceps. Increased tissue tension in R UT                                                                                                                               TREATMENT DATE:  10/10/2024: Nustep level 4 x5 min with PT present to discuss status Standing scapular retraction x10 Standing shoulder flexion at finger ladder x4 (level 28) Standing shoulder scaption at finger ladder x 4 (level 25) Standing rows with yellow tband 2x10 Standing extension with yellow TB 2 x 10 Standing right shoulder IR with yellow tband 2x10 Standing right shoulder ER unable to do with band. Shoulder counter pulley (set up on triceps extension ) - flexion, scaption, abduction 3# AW x 12 each direction (she likes this one) Standing  shoulder flexion in mirror unable to do without shoulder elevation Standing shoulder depression x 20 S/L ER painful Supine ER x 10 B , then with 1# wts x 20 Supine shoulder circles at 90 deg flexion circles CW/CCW with 1# wt  x 10 B Supine AA flex with dowel painfree range only Supine ER 2x 10 with towel under  arm   10/07/2024: Nustep level 4 x5 min with PT present to discuss status Standing scapular retraction x10 Standing shoulder flexion at finger ladder x4 (level 28) Standing shoulder scaption at finger ladder x 4 (level 25) Standing rows with yellow tband 2x10 Standing extension with yellow TB 2 x 10 Standing right shoulder IR with yellow tband 2x10 Standing right shoulder ER with yellow tband 2x10 (visual and tactile cues to prevent compensations) Shoulder pulley (flexion and abduction) 2 mins each Shoulder counter pulley (set up on triceps extension ) - flexion, scaption, abduction 3# AW x 12 each direction (she likes this one) Standing shoulder flexion (PT providing tactile cues to decrease compensation & encourage scapular movement) Shoulder flexion stretch at counter x 5 with 3 sec hold Attempted cabinet reach to 1st level but patient had increased pain Standing right shoulder flexion with hand up the stair railing x10 Standing shoulder flexion wall wash x8 right UE Standing shoulder scaption wall wash x8 right UE  10/02/2024: Nustep level 4 x5 min with PT present to discuss status Seated scapular retraction x10 Standing shoulder flexion wall wash 2x10 right UE Standing shoulder scaption wall wash 2x10 right UE Standing rows with yellow tband 2x10 Standing shoulder extension with yellow tband 2x10 Standing right shoulder IR with yellow tband 2x10 Standing right shoulder ER with yellow tband 2x10 Attempted standing shoulder flexion with dowel rod, but unable to complete without compensations Standing shoulder extension with dowel rod 2x10 Supine shoulder flexion with dowel rod 2x10 Supine serratus punch with 1# dumbbell x15 bilat Standing right shoulder flexion with hand up the stair railing x10   09/30/24   See pt ed and HEP   PATIENT EDUCATION: Education details: PT eval findings, anticipated POC, initial HEP, and discussed with patient not to push her exercises into  pain.   Person educated: Patient Education method: Explanation, Demonstration, Tactile cues, Verbal cues, and Handouts Education comprehension: verbalized understanding and returned demonstration  HOME EXERCISE PROGRAM: Access Code: [email or web address] URL: [email or web address] Date: 09/30/2024 Prepared by: Mliss  Exercises - Standing Scapular Retraction  - 3 x daily - 7 x weekly - 1 sets - 10 reps - 10 hold - Standing Scapular Depression  - 3 x daily - 7 x weekly - 1 sets - 10 reps - Shoulder External Rotation and Scapular Retraction  - 3 x daily - 7 x weekly - 2 sets - 10 reps -   hold - Standing Shoulder Extension with Dowel  - 3 x daily - 7 x weekly - 2 sets - 10 reps - Supine Shoulder Flexion Extension AAROM with Dowel  - 3 x daily - 7 x weekly - 2 sets - 10 reps  ASSESSMENT:  CLINICAL IMPRESSION:  Patient reporting increased pain after last session. Sapphira is very impulsive with her exercises and needs constant cues to avoid pain and for correct form. PT explained that constantly causing pain in the shoulder is working against her progress. She was unable to do ER with resistance, so we tried other positions. The best was supine. She also is unable to perform flexion without elevating her shoulder. We focused on lower  trap activation and standing and pt encouraged to do at home. She continues to demonstrate potential for improvement and would benefit from continued skilled therapy to address remaining impairments.    OBJECTIVE IMPAIRMENTS: decreased activity tolerance, decreased ROM, decreased strength, increased muscle spasms, impaired flexibility, impaired UE functional use, postural dysfunction, and pain.   ACTIVITY LIMITATIONS: carrying, lifting, sleeping, bathing, toileting, dressing, reach over head, hygiene/grooming, and caring for others  PARTICIPATION LIMITATIONS: meal prep, cleaning, laundry, driving, shopping, community activity, and church  PERSONAL FACTORS: Age,  Behavior pattern, Fitness, Time since onset of injury/illness/exacerbation, and 3+ comorbidities: Afib, HTN CHF are also affecting patient's functional outcome.   REHAB POTENTIAL: Excellent  CLINICAL DECISION MAKING: Evolving/moderate complexity  EVALUATION COMPLEXITY: Moderate   GOALS: Goals reviewed with patient? Yes  SHORT TERM GOALS: Target date: 10/28/24  Pain report to be no greater than 4/10  Baseline: Goal status: Ongoing  2.  Patient will be independent with initial HEP  Baseline:  Goal status: INITIAL    LONG TERM GOALS: Target date: 11/25/24  Patient to report pain no greater than 2/10 with ADLS Baseline:  Goal status: INITIAL  2.  Patient to be independent with advanced HEP  Baseline:  Goal status: INITIAL  3.  Patient to be able to don/doff her jacket without difficulty  Baseline:  Goal status: INITIAL  4.  Patient will be able to reach overhead into cabinets and on top of shelves without pain  Baseline:  Goal status: INITIAL  5.  Patient to be able to do all ADL's and IADL's independently with 4 to 4+/5 strength in the shoulder. Baseline:  Goal status: INITIAL  6.  Quick Dash score to improve by 4-6 points Baseline: 54.5 / 100 = 54.5 % Goal status: INITIAL   PLAN:  PT FREQUENCY: 2x/week  PT DURATION: 8 weeks  PLANNED INTERVENTIONS: 97110-Therapeutic exercises, 97530- Therapeutic activity, 97112- Neuromuscular re-education, 97535- Self Care, 02859- Manual therapy, [phone]- Aquatic Therapy, [phone]- Electrical stimulation (unattended), 97016- Vasopneumatic device, [id number]- Ionotophoresis 4mg /ml Dexamethasone , 79439 (1-2 muscles), 20561 (3+ muscles)- Dry Needling, Patient/Family education, Balance training, Taping, Joint mobilization, Spinal mobilization, Scar mobilization, Cryotherapy, and Moist heat  PLAN FOR NEXT SESSION: assess tolerance to treatment session; update HEP, R shoulder ROM, strengthening, scapular mobs, manual/DN to JONELLE MARDEEN Mliss Danelle, PT  10/10/24 11:03 AM [facility] [address] [place], [place] [address] Phone # [phone] Fax [phone]

## 2024-10-10 ENCOUNTER — Ambulatory Visit: Admitting: Physical Therapy

## 2024-10-10 ENCOUNTER — Encounter: Payer: Self-pay | Admitting: Physical Therapy

## 2024-10-10 DIAGNOSIS — M25611 Stiffness of right shoulder, not elsewhere classified: Secondary | ICD-10-CM

## 2024-10-10 DIAGNOSIS — M25561 Pain in right knee: Secondary | ICD-10-CM

## 2024-10-10 DIAGNOSIS — M25511 Pain in right shoulder: Secondary | ICD-10-CM | POA: Diagnosis not present

## 2024-10-10 DIAGNOSIS — R252 Cramp and spasm: Secondary | ICD-10-CM

## 2024-10-10 DIAGNOSIS — R262 Difficulty in walking, not elsewhere classified: Secondary | ICD-10-CM

## 2024-10-10 DIAGNOSIS — M6281 Muscle weakness (generalized): Secondary | ICD-10-CM

## 2024-10-16 ENCOUNTER — Ambulatory Visit

## 2024-10-16 DIAGNOSIS — M25511 Pain in right shoulder: Secondary | ICD-10-CM

## 2024-10-16 DIAGNOSIS — M25561 Pain in right knee: Secondary | ICD-10-CM

## 2024-10-16 DIAGNOSIS — R252 Cramp and spasm: Secondary | ICD-10-CM

## 2024-10-16 DIAGNOSIS — M6281 Muscle weakness (generalized): Secondary | ICD-10-CM

## 2024-10-16 DIAGNOSIS — R262 Difficulty in walking, not elsewhere classified: Secondary | ICD-10-CM

## 2024-10-16 DIAGNOSIS — M25611 Stiffness of right shoulder, not elsewhere classified: Secondary | ICD-10-CM

## 2024-10-16 NOTE — Therapy (Addendum)
 OUTPATIENT PHYSICAL THERAPY TREATMENT NOTE   Patient Name: Chelsea [name] MRN: [id number] DOB:1941-06-02, 83 y.o., female Today's Date: 10/16/2024  END OF SESSION:  PT End of Session - 10/16/24 1101     Visit Number 5    Date for Recertification  11/25/24    Authorization Type MCR and Medicaid secondary    PT Start Time 1102    PT Stop Time 1145    PT Time Calculation (min) 43 min    Activity Tolerance Patient tolerated treatment well    Behavior During Therapy [facility] for tasks assessed/performed            Past Medical History:  Diagnosis Date   Arthritis    Atrial fibrillation (HCC)    Atrial fibrillation with RVR (HCC) 03/04/2015   CHF (congestive heart failure) (HCC)    Hypertension    Sleep apnea    does not use cpap   Past Surgical History:  Procedure Laterality Date   NO PAST SURGERIES     TOTAL KNEE ARTHROPLASTY Right 12/06/2023   Procedure: RIGHT TOTAL KNEE ARTHROPLASTY;  Surgeon: Jerri Kay HERO, MD;  Location: [facility] OR;  Service: Orthopedics;  Laterality: Right;   Patient Active Problem List   Diagnosis Date Noted   Trigger finger, left middle finger 09/19/2024   Status post total right knee replacement 12/06/2023   Pitting edema 12/05/2023   Primary osteoarthritis of left knee 02/23/2021   Ankle impingement syndrome, left 02/23/2021   Primary osteoarthritis of right knee 12/03/2020   Chronic anticoagulation 03/06/2015   Edema-dopplers negative for DVT 03/06/2015   CHF exacerbation (HCC)    SOB (shortness of breath)    Chest pain    Acute on chronic diastolic congestive heart failure (HCC)    Atrial fibrillation with RVR (HCC) 03/04/2015   Essential hypertension 02/24/2015   Chronic diastolic heart failure (HCC) 12/10/2014   Permanent atrial fibrillation (HCC) 12/10/2014   Paroxysmal nocturnal dyspnea 12/10/2014    PCP: Medicine, [facility]   REFERRING PROVIDER: Jerri Kay HERO, MD   REFERRING DIAG:  [id number]  (ICD-10-CM) - Chronic right shoulder pain  Z96.651 (ICD-10-CM) - Status post total right knee replacement  PT eval and treat right proximal humerus fracture. ROM and strengthening     THERAPY DIAG:  Acute pain of right shoulder  Stiffness of right shoulder, not elsewhere classified  Cramp and spasm  Muscle weakness (generalized)  Acute pain of right knee  Difficulty in walking, not elsewhere classified  Rationale for Evaluation and Treatment: Rehabilitation  ONSET DATE: September 2025  SUBJECTIVE:  SUBJECTIVE STATEMENT: Patient reports she is still hurting but can use her arm a little better.    Hand dominance: Right  PERTINENT HISTORY: Patient fell getting out of a car while in [place] and fractured her Right proximal humerus. Also had R TKR 10 months ago.  R TKR, L hand pain, Afib, CHF, HTN  PAIN:   10/16/24 Are you having pain? Yes: NPRS scale: 6/10 Pain location: mid R arm up to shoulder Pain description: ache Aggravating factors: moving arm behind back and OH Relieving factors: rest, exercise  PRECAUTIONS: None  RED FLAGS: None   WEIGHT BEARING RESTRICTIONS: No  FALLS:  Has patient fallen in last 6 months? Yes. Number of falls 1  LIVING ENVIRONMENT: Lives with: lives with their family Lives in: House/apartment   OCCUPATION: retired  PLOF: Independent  PATIENT GOALS:get better motion and decrease pain  NEXT MD VISIT: none scheduled  OBJECTIVE:  Note: Objective measures were completed at Evaluation unless otherwise noted.  DIAGNOSTIC FINDINGS:  09/10/24 Xrays show subacute healing proximal humerus fracture in acceptable alignment.   Shoulder X-ray:  Subacute healing proximal humerus fracture (07/27/2024)   PATIENT SURVEYS:   Quick Dash 54.5 / 100 = 54.5 %     COGNITION: Overall cognitive status: Within functional limits for tasks assessed     SENSATION: WFL  POSTURE: Elevated R shoulder  UPPER EXTREMITY ROM:   A/P  ROM Right eval Left eval  Shoulder flexion 127*   Shoulder extension 25* 47  Shoulder abduction 75*   Shoulder adduction    Shoulder internal rotation 25*/37 T8  Shoulder external rotation 43*   Elbow flexion    Elbow extension WFL *   (Blank rows = not tested)  UPPER EXTREMITY MMT:  MMT Right eval Left eval  Shoulder flexion 2+   Shoulder extension 3+   Shoulder abduction 2+   Shoulder adduction    Shoulder internal rotation 4*   Shoulder external rotation 4-*   Middle trapezius    Lower trapezius    Elbow flexion 4   Elbow extension    Grip strength (lbs)    (Blank rows = not tested)   PALPATION:  Palpation: TTP at R UT, parascapular muscles, biceps. Increased tissue tension in R UT                                                                                                                               TREATMENT DATE:  10/16/2024: UBE x 6 min 3/3 (PT present to discuss status/progress) Attempted 3 way scapular stabilization: patient needed heavy verbal, visual and tactile cues for slowing down and correct technique Attempted 4 D ball rolls: patient again, needed heavy cues and was only able to complete 2 of the 4 directions Shoulder extension 2 x 10 with red band : again, heavy verbal, visual and tactile cues Shoulder rows 2 x 10 with red band :  heavy verbal, visual and tactile cues Attempted fwd  bent shoulder exercises as patient was unable to lie prone but patient was not able to get into enough of a fwd bent position to isolate the correct musculature and would have caused more pain doing these exercises in the manner she was doing them.   Attempted tband shoulder ER and IR: patient has little to no ER capability Attempted supine shoulder flexion : heavy verbal, visual and tactile  cues Shoulder press toward ceiling 2 x 10 Discussed patients difficulty with the exercises and her impulsiveness.  Educated patient on importance of correct technique and being sure each rep is intentional.  Patient states she sees MD on the 16th and feels he needs to do something else.  Suggested patient do at least her next visit but with interpreter.    10/10/2024: Nustep level 4 x5 min with PT present to discuss status Standing scapular retraction x10 Standing shoulder flexion at finger ladder x4 (level 28) Standing shoulder scaption at finger ladder x 4 (level 25) Standing rows with yellow tband 2x10 Standing extension with yellow TB 2 x 10 Standing right shoulder IR with yellow tband 2x10 Standing right shoulder ER unable to do with band. Shoulder counter pulley (set up on triceps extension ) - flexion, scaption, abduction 3# AW x 12 each direction (she likes this one) Standing shoulder flexion in mirror unable to do without shoulder elevation Standing shoulder depression x 20 S/L ER painful Supine ER x 10 B , then with 1# wts x 20 Supine shoulder circles at 90 deg flexion circles CW/CCW with 1# wt  x 10 B Supine AA flex with dowel painfree range only Supine ER 2x 10 with towel under arm   10/07/2024: Nustep level 4 x5 min with PT present to discuss status Standing scapular retraction x10 Standing shoulder flexion at finger ladder x4 (level 28) Standing shoulder scaption at finger ladder x 4 (level 25) Standing rows with yellow tband 2x10 Standing extension with yellow TB 2 x 10 Standing right shoulder IR with yellow tband 2x10 Standing right shoulder ER with yellow tband 2x10 (visual and tactile cues to prevent compensations) Shoulder pulley (flexion and abduction) 2 mins each Shoulder counter pulley (set up on triceps extension ) - flexion, scaption, abduction 3# AW x 12 each direction (she likes this one) Standing shoulder flexion (PT providing tactile cues to decrease  compensation & encourage scapular movement) Shoulder flexion stretch at counter x 5 with 3 sec hold Attempted cabinet reach to 1st level but patient had increased pain Standing right shoulder flexion with hand up the stair railing x10 Standing shoulder flexion wall wash x8 right UE Standing shoulder scaption wall wash x8 right UE  10/02/2024: Nustep level 4 x5 min with PT present to discuss status Seated scapular retraction x10 Standing shoulder flexion wall wash 2x10 right UE Standing shoulder scaption wall wash 2x10 right UE Standing rows with yellow tband 2x10 Standing shoulder extension with yellow tband 2x10 Standing right shoulder IR with yellow tband 2x10 Standing right shoulder ER with yellow tband 2x10 Attempted standing shoulder flexion with dowel rod, but unable to complete without compensations Standing shoulder extension with dowel rod 2x10 Supine shoulder flexion with dowel rod 2x10 Supine serratus punch with 1# dumbbell x15 bilat Standing right shoulder flexion with hand up the stair railing x10   09/30/24   See pt ed and HEP   PATIENT EDUCATION: Education details: PT eval findings, anticipated POC, initial HEP, and discussed with patient not to push her exercises into pain.  Person educated: Patient Education method: Explanation, Demonstration, Tactile cues, Verbal cues, and Handouts Education comprehension: verbalized understanding and returned demonstration  HOME EXERCISE PROGRAM: Access Code: [email or web address] URL: [email or web address] Date: 09/30/2024 Prepared by: Mliss  Exercises - Standing Scapular Retraction  - 3 x daily - 7 x weekly - 1 sets - 10 reps - 10 hold - Standing Scapular Depression  - 3 x daily - 7 x weekly - 1 sets - 10 reps - Shoulder External Rotation and Scapular Retraction  - 3 x daily - 7 x weekly - 2 sets - 10 reps -   hold - Standing Shoulder Extension with Dowel  - 3 x daily - 7 x weekly - 2 sets - 10 reps - Supine Shoulder  Flexion Extension AAROM with Dowel  - 3 x daily - 7 x weekly - 2 sets - 10 reps  ASSESSMENT:  CLINICAL IMPRESSION:  Discussed patients difficulty with the exercises and her impulsiveness.  Educated patient on importance of correct technique and being sure each rep is intentional.  Patient states she sees MD on the 16th and feels he needs to do something else.  Suggested patient do at least her next visit but with interpreter. Patient was not understanding some of the instructions and when asked specific questions, she would answer inappropriately.  She may be missing a lot of the understanding of what we are doing due to language barrier.  She would benefit from continuing skilled PT with an interpreter.    OBJECTIVE IMPAIRMENTS: decreased activity tolerance, decreased ROM, decreased strength, increased muscle spasms, impaired flexibility, impaired UE functional use, postural dysfunction, and pain.   ACTIVITY LIMITATIONS: carrying, lifting, sleeping, bathing, toileting, dressing, reach over head, hygiene/grooming, and caring for others  PARTICIPATION LIMITATIONS: meal prep, cleaning, laundry, driving, shopping, community activity, and church  PERSONAL FACTORS: Age, Behavior pattern, Fitness, Time since onset of injury/illness/exacerbation, and 3+ comorbidities: Afib, HTN CHF are also affecting patient's functional outcome.   REHAB POTENTIAL: Excellent  CLINICAL DECISION MAKING: Evolving/moderate complexity  EVALUATION COMPLEXITY: Moderate   GOALS: Goals reviewed with patient? Yes  SHORT TERM GOALS: Target date: 10/28/24  Pain report to be no greater than 4/10  Baseline: Goal status: Ongoing  2.  Patient will be independent with initial HEP  Baseline:  Goal status: INITIAL    LONG TERM GOALS: Target date: 11/25/24  Patient to report pain no greater than 2/10 with ADLS Baseline:  Goal status: INITIAL  2.  Patient to be independent with advanced HEP  Baseline:  Goal status:  INITIAL  3.  Patient to be able to don/doff her jacket without difficulty  Baseline:  Goal status: INITIAL  4.  Patient will be able to reach overhead into cabinets and on top of shelves without pain  Baseline:  Goal status: INITIAL  5.  Patient to be able to do all ADL's and IADL's independently with 4 to 4+/5 strength in the shoulder. Baseline:  Goal status: INITIAL  6.  Quick Dash score to improve by 4-6 points Baseline: 54.5 / 100 = 54.5 % Goal status: INITIAL   PLAN:  PT FREQUENCY: 2x/week  PT DURATION: 8 weeks  PLANNED INTERVENTIONS: 97110-Therapeutic exercises, 97530- Therapeutic activity, 97112- Neuromuscular re-education, 97535- Self Care, 02859- Manual therapy, [id number]- Aquatic Therapy, G0283- Electrical stimulation (unattended), 97016- Vasopneumatic device, [id number]- Ionotophoresis 4mg /ml Dexamethasone , 79439 (1-2 muscles), 20561 (3+ muscles)- Dry Needling, Patient/Family education, Balance training, Taping, Joint mobilization, Spinal mobilization, Scar mobilization, Cryotherapy, and Moist heat  PLAN FOR NEXT SESSION: try  interpreter for next session so that patient understands all instructions, continue at least one more visit then patient will see MD.  We will proceed once patient sees MD.    Delon B. Kiersten Coss, PT 10/16/24 3:20 PM [facility] [address] [place], [place] [address] Phone # [phone] Fax [phone]

## 2024-10-17 ENCOUNTER — Other Ambulatory Visit: Payer: Self-pay | Admitting: Cardiology

## 2024-10-18 MED ORDER — DILTIAZEM HCL ER COATED BEADS 120 MG PO CP24: 120.0000 mg | ORAL_CAPSULE | Freq: Every day | ORAL | 3 refills | Status: AC

## 2024-10-22 ENCOUNTER — Ambulatory Visit: Admitting: Orthopaedic Surgery

## 2024-10-22 ENCOUNTER — Ambulatory Visit

## 2024-10-22 DIAGNOSIS — M25511 Pain in right shoulder: Secondary | ICD-10-CM | POA: Diagnosis not present

## 2024-10-22 DIAGNOSIS — M6281 Muscle weakness (generalized): Secondary | ICD-10-CM

## 2024-10-22 DIAGNOSIS — R262 Difficulty in walking, not elsewhere classified: Secondary | ICD-10-CM

## 2024-10-22 DIAGNOSIS — R252 Cramp and spasm: Secondary | ICD-10-CM

## 2024-10-22 DIAGNOSIS — M25611 Stiffness of right shoulder, not elsewhere classified: Secondary | ICD-10-CM

## 2024-10-22 DIAGNOSIS — M25561 Pain in right knee: Secondary | ICD-10-CM

## 2024-10-22 DIAGNOSIS — G8929 Other chronic pain: Secondary | ICD-10-CM | POA: Diagnosis not present

## 2024-10-22 DIAGNOSIS — R293 Abnormal posture: Secondary | ICD-10-CM

## 2024-10-22 NOTE — Therapy (Signed)
 OUTPATIENT PHYSICAL THERAPY TREATMENT NOTE   Patient Name: Chelsea [name] MRN: [id number] DOB:[birth year], 83 y.o., female Today's Date: 10/22/2024  END OF SESSION:  PT End of Session - 10/22/24 1106     Visit Number 6    Date for Recertification  11/25/24    Authorization Type MCR and Medicaid secondary    PT Start Time 1102    PT Stop Time 1155    PT Time Calculation (min) 53 min    Activity Tolerance Patient tolerated treatment well    Behavior During Therapy [facility] for tasks assessed/performed            Past Medical History:  Diagnosis Date   Arthritis    Atrial fibrillation (HCC)    Atrial fibrillation with RVR (HCC) 03/04/2015   CHF (congestive heart failure) (HCC)    Hypertension    Sleep apnea    does not use cpap   Past Surgical History:  Procedure Laterality Date   NO PAST SURGERIES     TOTAL KNEE ARTHROPLASTY Right 12/06/2023   Procedure: RIGHT TOTAL KNEE ARTHROPLASTY;  Surgeon: Jerri Kay HERO, MD;  Location: [facility] OR;  Service: Orthopedics;  Laterality: Right;   Patient Active Problem List   Diagnosis Date Noted   Trigger finger, left middle finger 09/19/2024   Status post total right knee replacement 12/06/2023   Pitting edema 12/05/2023   Primary osteoarthritis of left knee 02/23/2021   Ankle impingement syndrome, left 02/23/2021   Primary osteoarthritis of right knee 12/03/2020   Chronic anticoagulation 03/06/2015   Edema-dopplers negative for DVT 03/06/2015   CHF exacerbation (HCC)    SOB (shortness of breath)    Chest pain    Acute on chronic diastolic congestive heart failure (HCC)    Atrial fibrillation with RVR (HCC) 03/04/2015   Essential hypertension 02/24/2015   Chronic diastolic heart failure (HCC) 12/10/2014   Permanent atrial fibrillation (HCC) 12/10/2014   Paroxysmal nocturnal dyspnea 12/10/2014    PCP: Medicine, [facility]   REFERRING PROVIDER: Jerri Kay HERO, MD   REFERRING DIAG:  [phone]  (ICD-10-CM) - Chronic right shoulder pain  Z96.651 (ICD-10-CM) - Status post total right knee replacement  PT eval and treat right proximal humerus fracture. ROM and strengthening     THERAPY DIAG:  Acute pain of right shoulder  Stiffness of right shoulder, not elsewhere classified  Cramp and spasm  Muscle weakness (generalized)  Acute pain of right knee  Difficulty in walking, not elsewhere classified  Abnormal posture  Rationale for Evaluation and Treatment: Rehabilitation  ONSET DATE: September 2025  SUBJECTIVE:  SUBJECTIVE STATEMENT: Patient reports no pain but just can't reach overhead.  I have to help my arm up    Hand dominance: Right  PERTINENT HISTORY: Patient fell getting out of a car while in [place] and fractured her Right proximal humerus. Also had R TKR 10 months ago.  R TKR, L hand pain, Afib, CHF, HTN  PAIN:   10/22/24 Are you having pain? Yes: NPRS scale: 0/10 Pain location: mid R arm up to shoulder Pain description: ache Aggravating factors: moving arm behind back and OH Relieving factors: rest, exercise  PRECAUTIONS: None  RED FLAGS: None   WEIGHT BEARING RESTRICTIONS: No  FALLS:  Has patient fallen in last 6 months? Yes. Number of falls 1  LIVING ENVIRONMENT: Lives with: lives with their family Lives in: House/apartment   OCCUPATION: retired  PLOF: Independent  PATIENT GOALS:get better motion and decrease pain  NEXT MD VISIT: none scheduled  OBJECTIVE:  Note: Objective measures were completed at Evaluation unless otherwise noted.  DIAGNOSTIC FINDINGS:  09/10/24 Xrays show subacute healing proximal humerus fracture in acceptable alignment.   Shoulder X-ray:  Subacute healing proximal humerus fracture (07/27/2024)   PATIENT SURVEYS:   Quick  Dash 54.5 / 100 = 54.5 %    COGNITION: Overall cognitive status: Within functional limits for tasks assessed     SENSATION: WFL  POSTURE: Elevated R shoulder  UPPER EXTREMITY ROM:   A/P  ROM Right eval Left eval  Shoulder flexion 127*   Shoulder extension 25* 47  Shoulder abduction 75*   Shoulder adduction    Shoulder internal rotation 25*/37 T8  Shoulder external rotation 43*   Elbow flexion    Elbow extension WFL *   (Blank rows = not tested)  UPPER EXTREMITY MMT:  MMT Right eval Left eval  Shoulder flexion 2+   Shoulder extension 3+   Shoulder abduction 2+   Shoulder adduction    Shoulder internal rotation 4*   Shoulder external rotation 4-*   Middle trapezius    Lower trapezius    Elbow flexion 4   Elbow extension    Grip strength (lbs)    (Blank rows = not tested)   PALPATION:  Palpation: TTP at R UT, parascapular muscles, biceps. Increased tissue tension in R UT                                                                                                                               TREATMENT DATE:  10/22/2024: UBE x 6 min 3/3 (PT present to discuss status/progress) 3 way scapular stabilization: patient continues to need heavy verbal, visual and tactile cues for slowing down and correct technique 4 D ball rolls: patient, again, needed heavy cues but did complete all directions  Green loop bilateral arm chops with elbows fixed at side: heavy verbal, visual and tactile cues needed: had to restart several times Shoulder extension 2 x 10 with yellow band : decreased verbal, visual and tactile  cues needed today Attempted to use green machine for interpreter as patient does not seem to understand instructions:  interpreter came on but patient is quite impulsive and talked over PT and interpreter.  PT had to repeat instructions to slow down several times and then we lost connection with interpreter.  Patient states she does not need interpreter.  I know 3  languages  but explained to her that she may have some trouble with medical language because she was not understanding the instructions and was not doing the exercises correctly.  Took a few minutes to ask patient to listen and watch PT carefully instead of just swinging her arm around.  Explained that we need to isolate certain muscles because other muscles are overriding the ones that need to get stronger.  Explained that if the exercises are not done properly, her shoulder will not likely get better.   Fwd bent shoulder exercises as patient was unable to lie prone.  Did these leaning on counter today and she was able to complete the following: Fwd bent shoulder extension with heavy verbal, visual and tactile cues 2 x 10 right Fwd bent shoulder rows with heavy verbal, visual and tactile cues 2 x 10 right Fwd bent shoulder horizontal abduction with heavy verbal, visual and tactile cues 2 x 10 right Side lying shoulder ER with 0# 2 x 10 with heavy verbal, visual and tactile cues 2 x 10 right Supine serratus punch 2 x 10 with 0# with heavy verbal, visual and tactile cues 2 x 10 right Supine shoulder alphabet A-Z patient needed repeated heavy verbal, visual and tactile cues 2 x 10 right Supine shoulder flexion bilaterally 2 x 10 (patient did well with this one- very few verbal cues needed)   10/16/2024: UBE x 6 min 3/3 (PT present to discuss status/progress) Attempted 3 way scapular stabilization: patient needed heavy verbal, visual and tactile cues for slowing down and correct technique Attempted 4 D ball rolls: patient again, needed heavy cues and was only able to complete 2 of the 4 directions Shoulder extension 2 x 10 with red band : again, heavy verbal, visual and tactile cues Shoulder rows 2 x 10 with red band :  heavy verbal, visual and tactile cues Attempted fwd bent shoulder exercises as patient was unable to lie prone but patient was not able to get into enough of a fwd bent position to  isolate the correct musculature and would have caused more pain doing these exercises in the manner she was doing them.   Attempted tband shoulder ER and IR: patient has little to no ER capability Attempted supine shoulder flexion : heavy verbal, visual and tactile cues Shoulder press toward ceiling 2 x 10 Discussed patients difficulty with the exercises and her impulsiveness.  Educated patient on importance of correct technique and being sure each rep is intentional.  Patient states she sees MD on the 16th and feels he needs to do something else.  Suggested patient do at least her next visit but with interpreter.    10/10/2024: Nustep level 4 x5 min with PT present to discuss status Standing scapular retraction x10 Standing shoulder flexion at finger ladder x4 (level 28) Standing shoulder scaption at finger ladder x 4 (level 25) Standing rows with yellow tband 2x10 Standing extension with yellow TB 2 x 10 Standing right shoulder IR with yellow tband 2x10 Standing right shoulder ER unable to do with band. Shoulder counter pulley (set up on triceps extension ) - flexion, scaption,  abduction 3# AW x 12 each direction (she likes this one) Standing shoulder flexion in mirror unable to do without shoulder elevation Standing shoulder depression x 20 S/L ER painful Supine ER x 10 B , then with 1# wts x 20 Supine shoulder circles at 90 deg flexion circles CW/CCW with 1# wt  x 10 B Supine AA flex with dowel painfree range only Supine ER 2x 10 with towel under arm   09/30/24   See pt ed and HEP   PATIENT EDUCATION: Education details: PT eval findings, anticipated POC, initial HEP, and discussed with patient not to push her exercises into pain.   Person educated: Patient Education method: Explanation, Demonstration, Tactile cues, Verbal cues, and Handouts Education comprehension: verbalized understanding and returned demonstration  HOME EXERCISE PROGRAM: Access Code: [email or web address] URL:  [email or web address] Date: 09/30/2024 Prepared by: Mliss  Exercises - Standing Scapular Retraction  - 3 x daily - 7 x weekly - 1 sets - 10 reps - 10 hold - Standing Scapular Depression  - 3 x daily - 7 x weekly - 1 sets - 10 reps - Shoulder External Rotation and Scapular Retraction  - 3 x daily - 7 x weekly - 2 sets - 10 reps -   hold - Standing Shoulder Extension with Dowel  - 3 x daily - 7 x weekly - 2 sets - 10 reps - Supine Shoulder Flexion Extension AAROM with Dowel  - 3 x daily - 7 x weekly - 2 sets - 10 reps  ASSESSMENT:  CLINICAL IMPRESSION:  Patient did somewhat better today once interpreter came on and then discussing importance of doing the exercises correctly.  She continues to substitute with shoulder elevation with most exercises.  She needs heavy verbal, visual and tactile cues and reminders to slow down and pay attention to specific instructions to avoid doing the exercises improperly.    She would benefit from continuing skilled PT with an interpreter.    OBJECTIVE IMPAIRMENTS: decreased activity tolerance, decreased ROM, decreased strength, increased muscle spasms, impaired flexibility, impaired UE functional use, postural dysfunction, and pain.   ACTIVITY LIMITATIONS: carrying, lifting, sleeping, bathing, toileting, dressing, reach over head, hygiene/grooming, and caring for others  PARTICIPATION LIMITATIONS: meal prep, cleaning, laundry, driving, shopping, community activity, and church  PERSONAL FACTORS: Age, Behavior pattern, Fitness, Time since onset of injury/illness/exacerbation, and 3+ comorbidities: Afib, HTN CHF are also affecting patient's functional outcome.   REHAB POTENTIAL: Excellent  CLINICAL DECISION MAKING: Evolving/moderate complexity  EVALUATION COMPLEXITY: Moderate   GOALS: Goals reviewed with patient? Yes  SHORT TERM GOALS: Target date: 10/28/24  Pain report to be no greater than 4/10  Baseline: Goal status: Ongoing  2.   Patient will be independent with initial HEP  Baseline:  Goal status: INITIAL    LONG TERM GOALS: Target date: 11/25/24  Patient to report pain no greater than 2/10 with ADLS Baseline:  Goal status: INITIAL  2.  Patient to be independent with advanced HEP  Baseline:  Goal status: INITIAL  3.  Patient to be able to don/doff her jacket without difficulty  Baseline:  Goal status: INITIAL  4.  Patient will be able to reach overhead into cabinets and on top of shelves without pain  Baseline:  Goal status: INITIAL  5.  Patient to be able to do all ADL's and IADL's independently with 4 to 4+/5 strength in the shoulder. Baseline:  Goal status: INITIAL  6.  Quick Dash score to improve by 4-6 points Baseline: 54.5 / 100 =  54.5 % Goal status: INITIAL   PLAN:  PT FREQUENCY: 2x/week  PT DURATION: 8 weeks  PLANNED INTERVENTIONS: 97110-Therapeutic exercises, 97530- Therapeutic activity, 97112- Neuromuscular re-education, 97535- Self Care, 02859- Manual therapy, [phone]- Aquatic Therapy, [phone]- Electrical stimulation (unattended), 97016- Vasopneumatic device, [id number]- Ionotophoresis 4mg /ml Dexamethasone , 79439 (1-2 muscles), 20561 (3+ muscles)- Dry Needling, Patient/Family education, Balance training, Taping, Joint mobilization, Spinal mobilization, Scar mobilization, Cryotherapy, and Moist heat  PLAN FOR NEXT SESSION: Patient to MD for f/u today.  We will continue to focus on RC strengthening, scapular stabilization and ROM if she returns.     Delon B. Labrea Eccleston, PT 10/22/2024 12:11 PM [facility] [address], [address] [place], [place] [address] Phone # [phone] Fax [phone]

## 2024-10-22 NOTE — Progress Notes (Signed)
 Office Visit Note   Patient: Chelsea [name]           Date of Birth: Mar 18, 1941           MRN: [id number] Visit Date: 10/22/2024              Requested by: Medicine, [facility] [address] [place],  [place] [address] PCP: Medicine, [facility]   Assessment & Plan: Visit Diagnoses:  1. Chronic right shoulder pain     Plan: History of Present Illness Chelsea [name] is an 83 year old female who presents with shoulder pain following a proximal humerus fracture.  She sustained the proximal humerus fracture three months ago while in [place]. Shoulder pain has improved with physical therapy twice weekly and daily rubber band exercises, but she still has difficulty with activities such as going upstairs and reaching behind her back.  Examination of the right shoulder shows improvement in range of motion in all planes.  Her strength is still lacking quite a bit.  Assessment and Plan Right proximal humerus fracture Three months post-injury with healed fracture. Range of motion improving with physical therapy. Pain reduced but limited range of motion persists. Full recovery unlikely, but functional improvement expected with continued therapy. - Continue physical therapy twice a week. - Perform daily exercises as instructed by therapist. - At this point her fracture has healed.  She does not need any more orthopedic follow-ups unless she has any issues.  Follow-Up Instructions: No follow-ups on file.   Orders:  No orders of the defined types were placed in this encounter.  No orders of the defined types were placed in this encounter.     Procedures: No procedures performed   Clinical Data: No additional findings.   Subjective: Chief Complaint  Patient presents with   Right Knee - Routine Post Op    12/06/2023-R TKA   Right Shoulder - Pain   Left Hand - Pain    Middle finger    HPI  Review of Systems   Constitutional: Negative.   HENT: Negative.    Eyes: Negative.   Respiratory: Negative.    Cardiovascular: Negative.   Endocrine: Negative.   Musculoskeletal: Negative.   Neurological: Negative.   Hematological: Negative.   Psychiatric/Behavioral: Negative.    All other systems reviewed and are negative.    Objective: Vital Signs: LMP  (LMP Unknown)   Physical Exam Vitals and nursing note reviewed.  Constitutional:      Appearance: She is well-developed.  HENT:     Head: Atraumatic.     Nose: Nose normal.  Eyes:     Extraocular Movements: Extraocular movements intact.  Cardiovascular:     Pulses: Normal pulses.  Pulmonary:     Effort: Pulmonary effort is normal.  Abdominal:     Palpations: Abdomen is soft.  Musculoskeletal:     Cervical back: Neck supple.  Skin:    General: Skin is warm.     Capillary Refill: Capillary refill takes less than 2 seconds.  Neurological:     Mental Status: She is alert. Mental status is at baseline.  Psychiatric:        Behavior: Behavior normal.        Thought Content: Thought content normal.        Judgment: Judgment normal.     Ortho Exam  Specialty Comments:  No specialty comments available.  Imaging: No results found.   PMFS History: Patient  Active Problem List   Diagnosis Date Noted   Trigger finger, left middle finger 09/19/2024   Status post total right knee replacement 12/06/2023   Pitting edema 12/05/2023   Primary osteoarthritis of left knee 02/23/2021   Ankle impingement syndrome, left 02/23/2021   Primary osteoarthritis of right knee 12/03/2020   Chronic anticoagulation 03/06/2015   Edema-dopplers negative for DVT 03/06/2015   CHF exacerbation (HCC)    SOB (shortness of breath)    Chest pain    Acute on chronic diastolic congestive heart failure (HCC)    Atrial fibrillation with RVR (HCC) 03/04/2015   Essential hypertension 02/24/2015   Chronic diastolic heart failure (HCC) 12/10/2014   Permanent  atrial fibrillation (HCC) 12/10/2014   Paroxysmal nocturnal dyspnea 12/10/2014   Past Medical History:  Diagnosis Date   Arthritis    Atrial fibrillation (HCC)    Atrial fibrillation with RVR (HCC) 03/04/2015   CHF (congestive heart failure) (HCC)    Hypertension    Sleep apnea    does not use cpap    Family History  Problem Relation Age of Onset   Heart failure Father     Past Surgical History:  Procedure Laterality Date   NO PAST SURGERIES     TOTAL KNEE ARTHROPLASTY Right 12/06/2023   Procedure: RIGHT TOTAL KNEE ARTHROPLASTY;  Surgeon: Jerri Kay HERO, MD;  Location: [facility] OR;  Service: Orthopedics;  Laterality: Right;   Social History   Occupational History   Not on file  Tobacco Use   Smoking status: Never   Smokeless tobacco: Never  Vaping Use   Vaping status: Never Used  Substance and Sexual Activity   Alcohol use: No   Drug use: No   Sexual activity: Never

## 2024-10-24 ENCOUNTER — Ambulatory Visit

## 2024-10-24 DIAGNOSIS — M25511 Pain in right shoulder: Secondary | ICD-10-CM

## 2024-10-24 DIAGNOSIS — M25611 Stiffness of right shoulder, not elsewhere classified: Secondary | ICD-10-CM

## 2024-10-24 DIAGNOSIS — R262 Difficulty in walking, not elsewhere classified: Secondary | ICD-10-CM

## 2024-10-24 DIAGNOSIS — R293 Abnormal posture: Secondary | ICD-10-CM

## 2024-10-24 DIAGNOSIS — M25561 Pain in right knee: Secondary | ICD-10-CM

## 2024-10-24 DIAGNOSIS — R252 Cramp and spasm: Secondary | ICD-10-CM

## 2024-10-24 DIAGNOSIS — M6281 Muscle weakness (generalized): Secondary | ICD-10-CM

## 2024-10-24 NOTE — Therapy (Signed)
 OUTPATIENT PHYSICAL THERAPY TREATMENT NOTE   Patient Name: Chelsea [name] MRN: [id number] DOB:03-21-1941, 83 y.o., female Today's Date: 10/24/2024  END OF SESSION:  PT End of Session - 10/24/24 1100     Visit Number 7    Date for Recertification  11/25/24    Authorization Type MCR and Medicaid secondary    Progress Note Due on Visit 10    PT Start Time 1100    PT Stop Time 1145    PT Time Calculation (min) 45 min    Activity Tolerance Other (comment)   Patient limited by not taking instruction well- needs repeated verbal, tactile and visual cues to slow down and focus on technique. Once we have gotten her doing the correct technique, she resumes improper technique.   Behavior During Therapy [facility] for tasks assessed/performed            Past Medical History:  Diagnosis Date   Arthritis    Atrial fibrillation (HCC)    Atrial fibrillation with RVR (HCC) 03/04/2015   CHF (congestive heart failure) (HCC)    Hypertension    Sleep apnea    does not use cpap   Past Surgical History:  Procedure Laterality Date   NO PAST SURGERIES     TOTAL KNEE ARTHROPLASTY Right 12/06/2023   Procedure: RIGHT TOTAL KNEE ARTHROPLASTY;  Surgeon: Jerri Kay HERO, MD;  Location: [facility] OR;  Service: Orthopedics;  Laterality: Right;   Patient Active Problem List   Diagnosis Date Noted   Trigger finger, left middle finger 09/19/2024   Status post total right knee replacement 12/06/2023   Pitting edema 12/05/2023   Primary osteoarthritis of left knee 02/23/2021   Ankle impingement syndrome, left 02/23/2021   Primary osteoarthritis of right knee 12/03/2020   Chronic anticoagulation 03/06/2015   Edema-dopplers negative for DVT 03/06/2015   CHF exacerbation (HCC)    SOB (shortness of breath)    Chest pain    Acute on chronic diastolic congestive heart failure (HCC)    Atrial fibrillation with RVR (HCC) 03/04/2015   Essential hypertension 02/24/2015   Chronic diastolic heart failure (HCC)  97/96/7983   Permanent atrial fibrillation (HCC) 12/10/2014   Paroxysmal nocturnal dyspnea 12/10/2014    PCP: Medicine, [facility]   REFERRING PROVIDER: Jerri Kay HERO, MD   REFERRING DIAG:  [phone] (ICD-10-CM) - Chronic right shoulder pain  Z96.651 (ICD-10-CM) - Status post total right knee replacement  PT eval and treat right proximal humerus fracture. ROM and strengthening     THERAPY DIAG:  Acute pain of right shoulder  Stiffness of right shoulder, not elsewhere classified  Cramp and spasm  Muscle weakness (generalized)  Acute pain of right knee  Difficulty in walking, not elsewhere classified  Abnormal posture  Rationale for Evaluation and Treatment: Rehabilitation  ONSET DATE: September 2025  SUBJECTIVE:  SUBJECTIVE STATEMENT: Patient reports it's ok, I had pain yesterday but it's ok today   Hand dominance: Right  PERTINENT HISTORY: Patient fell getting out of a car while in [place] and fractured her Right proximal humerus. Also had R TKR 10 months ago.  R TKR, L hand pain, Afib, CHF, HTN  PAIN:   10/22/24 Are you having pain? Yes: NPRS scale: 0/10 Pain location: mid R arm up to shoulder Pain description: ache Aggravating factors: moving arm behind back and OH Relieving factors: rest, exercise  PRECAUTIONS: None  RED FLAGS: None   WEIGHT BEARING RESTRICTIONS: No  FALLS:  Has patient fallen in last 6 months? Yes. Number of falls 1  LIVING ENVIRONMENT: Lives with: lives with their family Lives in: House/apartment   OCCUPATION: retired  PLOF: Independent  PATIENT GOALS:get better motion and decrease pain  NEXT MD VISIT: none scheduled  OBJECTIVE:  Note: Objective measures were completed at Evaluation unless otherwise  noted.  DIAGNOSTIC FINDINGS:  09/10/24 Xrays show subacute healing proximal humerus fracture in acceptable alignment.   Shoulder X-ray:  Subacute healing proximal humerus fracture (07/27/2024)   PATIENT SURVEYS:   Quick Dash 54.5 / 100 = 54.5 %    COGNITION: Overall cognitive status: Within functional limits for tasks assessed     SENSATION: WFL  POSTURE: Elevated R shoulder  UPPER EXTREMITY ROM:   A/P  ROM Right eval Left eval  Shoulder flexion 127*   Shoulder extension 25* 47  Shoulder abduction 75*   Shoulder adduction    Shoulder internal rotation 25*/37 T8  Shoulder external rotation 43*   Elbow flexion    Elbow extension WFL *   (Blank rows = not tested)  UPPER EXTREMITY MMT:  MMT Right eval Left eval  Shoulder flexion 2+   Shoulder extension 3+   Shoulder abduction 2+   Shoulder adduction    Shoulder internal rotation 4*   Shoulder external rotation 4-*   Middle trapezius    Lower trapezius    Elbow flexion 4   Elbow extension    Grip strength (lbs)    (Blank rows = not tested)   PALPATION:  Palpation: TTP at R UT, parascapular muscles, biceps. Increased tissue tension in R UT                                                                                                                               TREATMENT DATE:  10/24/2024: UBE not available, did Nustep x 5 min level 1 (PT present to discuss status/progress) 3 way scapular stabilization: patient continues to need heavy verbal, visual and tactile cues for slowing down and correct technique 4 D ball rolls: patient, again, needed heavy cues but did complete all directions  Seated shoulder extension and rows 2 x 10 with yellow band : decreased verbal, visual and tactile cues needed today Fwd bent shoulder exercises.  Did these leaning on counter today and she was able to complete  the following: Fwd bent shoulder extension with heavy verbal, visual and tactile cues 2 x 10 right with 1# Fwd  bent shoulder rows with heavy verbal, visual and tactile cues 2 x 10 right with 1# Fwd bent shoulder horizontal abduction with heavy verbal, visual and tactile cues 2 x 10 right with 1#  Side lying shoulder ER with 0# 2 x 10 with heavy verbal, visual and tactile cues 2 x 10 right with 1# Supine serratus punch 2 x 10 with 0# with heavy verbal, visual and tactile cues 2 x 10 right with 1# Supine shoulder alphabet A-Z patient needed repeated heavy verbal, visual and tactile cues 2 x 10 right with 1# Supine shoulder flexion bilaterally 2 x 10 (patient did well with this one- very few verbal cues needed) with 1# Ice to right shoulder x 10 min at end of session   10/22/2024: UBE x 6 min 3/3 (PT present to discuss status/progress) 3 way scapular stabilization: patient continues to need heavy verbal, visual and tactile cues for slowing down and correct technique 4 D ball rolls: patient, again, needed heavy cues but did complete all directions  Green loop bilateral arm chops with elbows fixed at side: heavy verbal, visual and tactile cues needed: had to restart several times Shoulder extension 2 x 10 with yellow band : decreased verbal, visual and tactile cues needed today Attempted to use green machine for interpreter as patient does not seem to understand instructions:  interpreter came on but patient is quite impulsive and talked over PT and interpreter.  PT had to repeat instructions to slow down several times and then we lost connection with interpreter.  Patient states she does not need interpreter.  I know 3 languages  but explained to her that she may have some trouble with medical language because she was not understanding the instructions and was not doing the exercises correctly.  Took a few minutes to ask patient to listen and watch PT carefully instead of just swinging her arm around.  Explained that we need to isolate certain muscles because other muscles are overriding the ones that need to  get stronger.  Explained that if the exercises are not done properly, her shoulder will not likely get better.   Fwd bent shoulder exercises as patient was unable to lie prone.  Did these leaning on counter today and she was able to complete the following: Fwd bent shoulder extension with heavy verbal, visual and tactile cues 2 x 10 right Fwd bent shoulder rows with heavy verbal, visual and tactile cues 2 x 10 right Fwd bent shoulder horizontal abduction with heavy verbal, visual and tactile cues 2 x 10 right Side lying shoulder ER with 0# 2 x 10 with heavy verbal, visual and tactile cues 2 x 10 right Supine serratus punch 2 x 10 with 0# with heavy verbal, visual and tactile cues 2 x 10 right Supine shoulder alphabet A-Z patient needed repeated heavy verbal, visual and tactile cues 2 x 10 right Supine shoulder flexion bilaterally 2 x 10 (patient did well with this one- very few verbal cues needed)   10/16/2024: UBE x 6 min 3/3 (PT present to discuss status/progress) Attempted 3 way scapular stabilization: patient needed heavy verbal, visual and tactile cues for slowing down and correct technique Attempted 4 D ball rolls: patient again, needed heavy cues and was only able to complete 2 of the 4 directions Shoulder extension 2 x 10 with red band : again, heavy verbal, visual and  tactile cues Shoulder rows 2 x 10 with red band :  heavy verbal, visual and tactile cues Attempted fwd bent shoulder exercises as patient was unable to lie prone but patient was not able to get into enough of a fwd bent position to isolate the correct musculature and would have caused more pain doing these exercises in the manner she was doing them.   Attempted tband shoulder ER and IR: patient has little to no ER capability Attempted supine shoulder flexion : heavy verbal, visual and tactile cues Shoulder press toward ceiling 2 x 10 Discussed patients difficulty with the exercises and her impulsiveness.  Educated patient  on importance of correct technique and being sure each rep is intentional.  Patient states she sees MD on the 16th and feels he needs to do something else.  Suggested patient do at least her next visit but with interpreter.   09/30/24   See pt ed and HEP   PATIENT EDUCATION: Education details: PT eval findings, anticipated POC, initial HEP, and discussed with patient not to push her exercises into pain.   Person educated: Patient Education method: Explanation, Demonstration, Tactile cues, Verbal cues, and Handouts Education comprehension: verbalized understanding and returned demonstration  HOME EXERCISE PROGRAM: Access Code: [email or web address] URL: [email or web address] Date: 09/30/2024 Prepared by: Mliss  Exercises - Standing Scapular Retraction  - 3 x daily - 7 x weekly - 1 sets - 10 reps - 10 hold - Standing Scapular Depression  - 3 x daily - 7 x weekly - 1 sets - 10 reps - Shoulder External Rotation and Scapular Retraction  - 3 x daily - 7 x weekly - 2 sets - 10 reps -   hold - Standing Shoulder Extension with Dowel  - 3 x daily - 7 x weekly - 2 sets - 10 reps - Supine Shoulder Flexion Extension AAROM with Dowel  - 3 x daily - 7 x weekly - 2 sets - 10 reps  ASSESSMENT:  CLINICAL IMPRESSION:  Patient was quite impulsive again today.  She needs heavy verbal, visual and tactile cues and reminders to slow down and pay attention to specific instructions to avoid doing the exercises improperly.    She continues to compensate heavily and needs frequent reminders to depress the shoulder.  She would benefit from continuing skilled PT but progress may be limited by her impulsiveness and improper technique.    OBJECTIVE IMPAIRMENTS: decreased activity tolerance, decreased ROM, decreased strength, increased muscle spasms, impaired flexibility, impaired UE functional use, postural dysfunction, and pain.   ACTIVITY LIMITATIONS: carrying, lifting, sleeping, bathing, toileting, dressing,  reach over head, hygiene/grooming, and caring for others  PARTICIPATION LIMITATIONS: meal prep, cleaning, laundry, driving, shopping, community activity, and church  PERSONAL FACTORS: Age, Behavior pattern, Fitness, Time since onset of injury/illness/exacerbation, and 3+ comorbidities: Afib, HTN CHF are also affecting patient's functional outcome.   REHAB POTENTIAL: Excellent  CLINICAL DECISION MAKING: Evolving/moderate complexity  EVALUATION COMPLEXITY: Moderate   GOALS: Goals reviewed with patient? Yes  SHORT TERM GOALS: Target date: 10/28/24  Pain report to be no greater than 4/10  Baseline: Goal status: Ongoing  2.  Patient will be independent with initial HEP  Baseline:  Goal status: In progress    LONG TERM GOALS: Target date: 11/25/24  Patient to report pain no greater than 2/10 with ADLS Baseline:  Goal status: INITIAL  2.  Patient to be independent with advanced HEP  Baseline:  Goal status: INITIAL  3.  Patient to be able to don/doff  her jacket without difficulty  Baseline:  Goal status: INITIAL  4.  Patient will be able to reach overhead into cabinets and on top of shelves without pain  Baseline:  Goal status: INITIAL  5.  Patient to be able to do all ADL's and IADL's independently with 4 to 4+/5 strength in the shoulder. Baseline:  Goal status: INITIAL  6.  Quick Dash score to improve by 4-6 points Baseline: 54.5 / 100 = 54.5 % Goal status: INITIAL   PLAN:  PT FREQUENCY: 2x/week  PT DURATION: 8 weeks  PLANNED INTERVENTIONS: 97110-Therapeutic exercises, 97530- Therapeutic activity, 97112- Neuromuscular re-education, 97535- Self Care, 02859- Manual therapy, [phone]- Aquatic Therapy, [phone]- Electrical stimulation (unattended), 97016- Vasopneumatic device, [id number]- Ionotophoresis 4mg /ml Dexamethasone , 79439 (1-2 muscles), 20561 (3+ muscles)- Dry Needling, Patient/Family education, Balance training, Taping, Joint mobilization, Spinal mobilization, Scar  mobilization, Cryotherapy, and Moist heat  PLAN FOR NEXT SESSION:   We will continue to focus on RC strengthening, scapular stabilization and ROM working on proper technique.     Delon B. Arneisha Kincannon, PT 10/24/2024 11:49 AM [facility] [address] [place], [place] [address] Phone # [phone] Fax [phone]

## 2024-10-28 ENCOUNTER — Ambulatory Visit: Admitting: Physical Therapy

## 2024-10-28 ENCOUNTER — Telehealth: Payer: Self-pay | Admitting: Physical Therapy

## 2024-10-28 NOTE — Telephone Encounter (Signed)
 Phoned patient. She thought her appointment was at 2:30, not 12:30 pm. While attempting to reschedule patient she informed me she was leaving the country 12/26 and not returning until February. Patient will call each day to see if there are any cancellations prior to her departure.

## 2024-10-28 NOTE — Therapy (Incomplete)
 " OUTPATIENT PHYSICAL THERAPY TREATMENT NOTE   Patient Name: Chelsea [name] MRN: [id number] DOB:1941-10-15, 83 y.o., female Today's Date: 10/28/2024  END OF SESSION:      Past Medical History:  Diagnosis Date   Arthritis    Atrial fibrillation (HCC)    Atrial fibrillation with RVR (HCC) 03/04/2015   CHF (congestive heart failure) (HCC)    Hypertension    Sleep apnea    does not use cpap   Past Surgical History:  Procedure Laterality Date   NO PAST SURGERIES     TOTAL KNEE ARTHROPLASTY Right 12/06/2023   Procedure: RIGHT TOTAL KNEE ARTHROPLASTY;  Surgeon: Jerri Kay HERO, MD;  Location: [facility] OR;  Service: Orthopedics;  Laterality: Right;   Patient Active Problem List   Diagnosis Date Noted   Trigger finger, left middle finger 09/19/2024   Status post total right knee replacement 12/06/2023   Pitting edema 12/05/2023   Primary osteoarthritis of left knee 02/23/2021   Ankle impingement syndrome, left 02/23/2021   Primary osteoarthritis of right knee 12/03/2020   Chronic anticoagulation 03/06/2015   Edema-dopplers negative for DVT 03/06/2015   CHF exacerbation (HCC)    SOB (shortness of breath)    Chest pain    Acute on chronic diastolic congestive heart failure (HCC)    Atrial fibrillation with RVR (HCC) 03/04/2015   Essential hypertension 02/24/2015   Chronic diastolic heart failure (HCC) 12/10/2014   Permanent atrial fibrillation (HCC) 12/10/2014   Paroxysmal nocturnal dyspnea 12/10/2014    PCP: Medicine, [facility]   REFERRING PROVIDER: Medicine, [facility]   REFERRING DIAG:  [phone] (ICD-10-CM) - Chronic right shoulder pain  Z96.651 (ICD-10-CM) - Status post total right knee replacement  PT eval and treat right proximal humerus fracture. ROM and strengthening     THERAPY DIAG:  No diagnosis found.  Rationale for Evaluation and Treatment: Rehabilitation  ONSET DATE: September 2025  SUBJECTIVE:                                                                                                                                                                                       SUBJECTIVE STATEMENT: ***  Hand dominance: Right  PERTINENT HISTORY: Patient fell getting out of a car while in [place] and fractured her Right proximal humerus. Also had R TKR 10 months ago.  R TKR, L hand pain, Afib, CHF, HTN  PAIN:   10/28/2024  Are you having pain? Yes: NPRS scale: 0/10 Pain location: mid R arm up to shoulder Pain description: ache Aggravating factors: moving arm behind back and OH Relieving factors: rest, exercise  PRECAUTIONS: None  RED FLAGS: None  WEIGHT BEARING RESTRICTIONS: No  FALLS:  Has patient fallen in last 6 months? Yes. Number of falls 1  LIVING ENVIRONMENT: Lives with: lives with their family Lives in: House/apartment   OCCUPATION: retired  PLOF: Independent  PATIENT GOALS:get better motion and decrease pain  NEXT MD VISIT: none scheduled  OBJECTIVE:  Note: Objective measures were completed at Evaluation unless otherwise noted.  DIAGNOSTIC FINDINGS:  09/10/24 Xrays show subacute healing proximal humerus fracture in acceptable alignment.   Shoulder X-ray:  Subacute healing proximal humerus fracture (07/27/2024)   PATIENT SURVEYS:   Quick Dash 54.5 / 100 = 54.5 %    COGNITION: Overall cognitive status: Within functional limits for tasks assessed     SENSATION: WFL  POSTURE: Elevated R shoulder  UPPER EXTREMITY ROM:   A/P  ROM Right eval Left eval  Shoulder flexion 127*   Shoulder extension 25* 47  Shoulder abduction 75*   Shoulder adduction    Shoulder internal rotation 25*/37 T8  Shoulder external rotation 43*   Elbow flexion    Elbow extension WFL *   (Blank rows = not tested)  UPPER EXTREMITY MMT:  MMT Right eval Left eval  Shoulder flexion 2+   Shoulder extension 3+   Shoulder abduction 2+   Shoulder adduction     Shoulder internal rotation 4*   Shoulder external rotation 4-*   Middle trapezius    Lower trapezius    Elbow flexion 4   Elbow extension    Grip strength (lbs)    (Blank rows = not tested)   PALPATION:  Palpation: TTP at R UT, parascapular muscles, biceps. Increased tissue tension in R UT                                                                                                                               TREATMENT DATE:  10/28/2024: UBE *** not available, did Nustep x 5 min level 1 (PT present to discuss status/progress) 3 way scapular stabilization: patient continues to need heavy verbal, visual and tactile cues for slowing down and correct technique 4 D ball rolls: patient, again, needed heavy cues but did complete all directions  Seated shoulder extension and rows 2 x 10 with yellow band : decreased verbal, visual and tactile cues needed today Fwd bent shoulder exercises.  Did these leaning on counter today and she was able to complete the following: Fwd bent shoulder extension with heavy verbal, visual and tactile cues 2 x 10 right with 1# Fwd bent shoulder rows with heavy verbal, visual and tactile cues 2 x 10 right with 1# Fwd bent shoulder horizontal abduction with heavy verbal, visual and tactile cues 2 x 10 right with 1#  Side lying shoulder ER with 0# 2 x 10 with heavy verbal, visual and tactile cues 2 x 10 right with 1# Supine serratus punch 2 x 10 with 0# with heavy verbal, visual and tactile cues 2 x 10 right with 1#  Supine shoulder alphabet A-Z patient needed repeated heavy verbal, visual and tactile cues 2 x 10 right with 1# Supine shoulder flexion bilaterally 2 x 10 (patient did well with this one- very few verbal cues needed) with 1# Ice to right shoulder x 10 min at end of session  10/24/2024: UBE not available, did Nustep x 5 min level 1 (PT present to discuss status/progress) 3 way scapular stabilization: patient continues to need heavy verbal, visual  and tactile cues for slowing down and correct technique 4 D ball rolls: patient, again, needed heavy cues but did complete all directions  Seated shoulder extension and rows 2 x 10 with yellow band : decreased verbal, visual and tactile cues needed today Fwd bent shoulder exercises.  Did these leaning on counter today and she was able to complete the following: Fwd bent shoulder extension with heavy verbal, visual and tactile cues 2 x 10 right with 1# Fwd bent shoulder rows with heavy verbal, visual and tactile cues 2 x 10 right with 1# Fwd bent shoulder horizontal abduction with heavy verbal, visual and tactile cues 2 x 10 right with 1#  Side lying shoulder ER with 0# 2 x 10 with heavy verbal, visual and tactile cues 2 x 10 right with 1# Supine serratus punch 2 x 10 with 0# with heavy verbal, visual and tactile cues 2 x 10 right with 1# Supine shoulder alphabet A-Z patient needed repeated heavy verbal, visual and tactile cues 2 x 10 right with 1# Supine shoulder flexion bilaterally 2 x 10 (patient did well with this one- very few verbal cues needed) with 1# Ice to right shoulder x 10 min at end of session   10/22/2024: UBE x 6 min 3/3 (PT present to discuss status/progress) 3 way scapular stabilization: patient continues to need heavy verbal, visual and tactile cues for slowing down and correct technique 4 D ball rolls: patient, again, needed heavy cues but did complete all directions  Green loop bilateral arm chops with elbows fixed at side: heavy verbal, visual and tactile cues needed: had to restart several times Shoulder extension 2 x 10 with yellow band : decreased verbal, visual and tactile cues needed today Attempted to use green machine for interpreter as patient does not seem to understand instructions:  interpreter came on but patient is quite impulsive and talked over PT and interpreter.  PT had to repeat instructions to slow down several times and then we lost connection with  interpreter.  Patient states she does not need interpreter.  I know 3 languages  but explained to her that she may have some trouble with medical language because she was not understanding the instructions and was not doing the exercises correctly.  Took a few minutes to ask patient to listen and watch PT carefully instead of just swinging her arm around.  Explained that we need to isolate certain muscles because other muscles are overriding the ones that need to get stronger.  Explained that if the exercises are not done properly, her shoulder will not likely get better.   Fwd bent shoulder exercises as patient was unable to lie prone.  Did these leaning on counter today and she was able to complete the following: Fwd bent shoulder extension with heavy verbal, visual and tactile cues 2 x 10 right Fwd bent shoulder rows with heavy verbal, visual and tactile cues 2 x 10 right Fwd bent shoulder horizontal abduction with heavy verbal, visual and tactile cues 2 x 10 right Side lying  shoulder ER with 0# 2 x 10 with heavy verbal, visual and tactile cues 2 x 10 right Supine serratus punch 2 x 10 with 0# with heavy verbal, visual and tactile cues 2 x 10 right Supine shoulder alphabet A-Z patient needed repeated heavy verbal, visual and tactile cues 2 x 10 right Supine shoulder flexion bilaterally 2 x 10 (patient did well with this one- very few verbal cues needed)    PATIENT EDUCATION: Education details: PT eval findings, anticipated POC, initial HEP, and discussed with patient not to push her exercises into pain.   Person educated: Patient Education method: Explanation, Demonstration, Tactile cues, Verbal cues, and Handouts Education comprehension: verbalized understanding and returned demonstration  HOME EXERCISE PROGRAM: Access Code: [email or web address] URL: [email or web address] Date: 09/30/2024 Prepared by: Mliss  Exercises - Standing Scapular Retraction  - 3 x daily - 7 x weekly - 1 sets  - 10 reps - 10 hold - Standing Scapular Depression  - 3 x daily - 7 x weekly - 1 sets - 10 reps - Shoulder External Rotation and Scapular Retraction  - 3 x daily - 7 x weekly - 2 sets - 10 reps -   hold - Standing Shoulder Extension with Dowel  - 3 x daily - 7 x weekly - 2 sets - 10 reps - Supine Shoulder Flexion Extension AAROM with Dowel  - 3 x daily - 7 x weekly - 2 sets - 10 reps  ASSESSMENT:  CLINICAL IMPRESSION:  ***Patient was quite impulsive again today.  She needs heavy verbal, visual and tactile cues and reminders to slow down and pay attention to specific instructions to avoid doing the exercises improperly.    She continues to compensate heavily and needs frequent reminders to depress the shoulder.  She would benefit from continuing skilled PT but progress may be limited by her impulsiveness and improper technique.    OBJECTIVE IMPAIRMENTS: decreased activity tolerance, decreased ROM, decreased strength, increased muscle spasms, impaired flexibility, impaired UE functional use, postural dysfunction, and pain.   ACTIVITY LIMITATIONS: carrying, lifting, sleeping, bathing, toileting, dressing, reach over head, hygiene/grooming, and caring for others  PARTICIPATION LIMITATIONS: meal prep, cleaning, laundry, driving, shopping, community activity, and church  PERSONAL FACTORS: Age, Behavior pattern, Fitness, Time since onset of injury/illness/exacerbation, and 3+ comorbidities: Afib, HTN CHF are also affecting patient's functional outcome.   REHAB POTENTIAL: Excellent  CLINICAL DECISION MAKING: Evolving/moderate complexity  EVALUATION COMPLEXITY: Moderate   GOALS: Goals reviewed with patient? Yes  SHORT TERM GOALS: Target date: 10/28/24  Pain report to be no greater than 4/10  Baseline: Goal status: Ongoing  2.  Patient will be independent with initial HEP  Baseline:  Goal status: In progress    LONG TERM GOALS: Target date: 11/25/24  Patient to report pain no greater  than 2/10 with ADLS Baseline:  Goal status: INITIAL  2.  Patient to be independent with advanced HEP  Baseline:  Goal status: INITIAL  3.  Patient to be able to don/doff her jacket without difficulty  Baseline:  Goal status: INITIAL  4.  Patient will be able to reach overhead into cabinets and on top of shelves without pain  Baseline:  Goal status: INITIAL  5.  Patient to be able to do all ADL's and IADL's independently with 4 to 4+/5 strength in the shoulder. Baseline:  Goal status: INITIAL  6.  Quick Dash score to improve by 4-6 points Baseline: 54.5 / 100 = 54.5 % Goal status: INITIAL   PLAN:  PT  FREQUENCY: 2x/week  PT DURATION: 8 weeks  PLANNED INTERVENTIONS: 97110-Therapeutic exercises, 97530- Therapeutic activity, 97112- Neuromuscular re-education, 97535- Self Care, 02859- Manual therapy, [phone]- Aquatic Therapy, [phone]- Electrical stimulation (unattended), 97016- Vasopneumatic device, [id number]- Ionotophoresis 4mg /ml Dexamethasone , 79439 (1-2 muscles), 20561 (3+ muscles)- Dry Needling, Patient/Family education, Balance training, Taping, Joint mobilization, Spinal mobilization, Scar mobilization, Cryotherapy, and Moist heat  PLAN FOR NEXT SESSION:   We will continue to focus on RC strengthening, scapular stabilization and ROM working on proper technique.     Mliss Cummins, PT  10/28/2024 8:19 AM [facility] [address] [place], [place] [address] Phone # [phone] Fax [phone]   "

## 2024-10-29 ENCOUNTER — Encounter: Payer: Self-pay | Admitting: Physical Therapy

## 2024-10-29 ENCOUNTER — Ambulatory Visit: Admitting: Physical Therapy

## 2024-10-29 DIAGNOSIS — M25511 Pain in right shoulder: Secondary | ICD-10-CM

## 2024-10-29 DIAGNOSIS — M25611 Stiffness of right shoulder, not elsewhere classified: Secondary | ICD-10-CM

## 2024-10-29 DIAGNOSIS — M6281 Muscle weakness (generalized): Secondary | ICD-10-CM

## 2024-10-29 NOTE — Therapy (Signed)
 " OUTPATIENT PHYSICAL THERAPY TREATMENT NOTE   Patient Name: Chelsea [name] MRN: [id number] DOB:[birth year], 83 y.o., female Today's Date: 10/29/2024  END OF SESSION:  PT End of Session - 10/29/24 0933     Visit Number 8    Date for Recertification  11/25/24    Authorization Type MCR and Medicaid secondary    Progress Note Due on Visit 10    PT Start Time 0933    PT Stop Time 1013    PT Time Calculation (min) 40 min             Past Medical History:  Diagnosis Date   Arthritis    Atrial fibrillation (HCC)    Atrial fibrillation with RVR (HCC) 03/04/2015   CHF (congestive heart failure) (HCC)    Hypertension    Sleep apnea    does not use cpap   Past Surgical History:  Procedure Laterality Date   NO PAST SURGERIES     TOTAL KNEE ARTHROPLASTY Right 12/06/2023   Procedure: RIGHT TOTAL KNEE ARTHROPLASTY;  Surgeon: Jerri Kay HERO, MD;  Location: [facility] OR;  Service: Orthopedics;  Laterality: Right;   Patient Active Problem List   Diagnosis Date Noted   Trigger finger, left middle finger 09/19/2024   Status post total right knee replacement 12/06/2023   Pitting edema 12/05/2023   Primary osteoarthritis of left knee 02/23/2021   Ankle impingement syndrome, left 02/23/2021   Primary osteoarthritis of right knee 12/03/2020   Chronic anticoagulation 03/06/2015   Edema-dopplers negative for DVT 03/06/2015   CHF exacerbation (HCC)    SOB (shortness of breath)    Chest pain    Acute on chronic diastolic congestive heart failure (HCC)    Atrial fibrillation with RVR (HCC) 03/04/2015   Essential hypertension 02/24/2015   Chronic diastolic heart failure (HCC) 12/10/2014   Permanent atrial fibrillation (HCC) 12/10/2014   Paroxysmal nocturnal dyspnea 12/10/2014    PCP: Medicine, [facility]   REFERRING PROVIDER: Jerri Kay HERO, MD   REFERRING DIAG:  [phone] (ICD-10-CM) - Chronic right shoulder pain  Z96.651 (ICD-10-CM) - Status post total  right knee replacement  PT eval and treat right proximal humerus fracture. ROM and strengthening     THERAPY DIAG:  Acute pain of right shoulder  Stiffness of right shoulder, not elsewhere classified  Muscle weakness (generalized)  Rationale for Evaluation and Treatment: Rehabilitation  ONSET DATE: September 2025  SUBJECTIVE:                                                                                                                                                                                      SUBJECTIVE STATEMENT: A  little bit of pain right upper arm.    Hand dominance: Right  PERTINENT HISTORY: Patient fell getting out of a car while in [place] and fractured her Right proximal humerus. Also had R TKR 10 months ago.  R TKR, L hand pain, Afib, CHF, HTN  PAIN:   10/29/2024  Are you having pain? Yes: NPRS scale:  a little/10 Pain location: mid R arm up to shoulder Pain description: ache Aggravating factors: moving arm behind back and OH Relieving factors: rest, exercise  PRECAUTIONS: None  RED FLAGS: None   WEIGHT BEARING RESTRICTIONS: No  FALLS:  Has patient fallen in last 6 months? Yes. Number of falls 1  LIVING ENVIRONMENT: Lives with: lives with their family Lives in: House/apartment   OCCUPATION: retired  PLOF: Independent  PATIENT GOALS:get better motion and decrease pain  NEXT MD VISIT: none scheduled  OBJECTIVE:  Note: Objective measures were completed at Evaluation unless otherwise noted.  DIAGNOSTIC FINDINGS:  09/10/24 Xrays show subacute healing proximal humerus fracture in acceptable alignment.   Shoulder X-ray:  Subacute healing proximal humerus fracture (07/27/2024)   PATIENT SURVEYS:   Quick Dash 54.5 / 100 = 54.5 %    COGNITION: Overall cognitive status: Within functional limits for tasks assessed     SENSATION: WFL  POSTURE: Elevated R shoulder  UPPER EXTREMITY ROM:   A/P  ROM Right eval Left eval 12/23   Shoulder flexion 127*  Place and hold at 110 degrees/with ball assist or finger ladder to 145 degrees  Shoulder extension 25* 47   Shoulder abduction 75*  65 with compensation  Shoulder adduction     Shoulder internal rotation 25*/37 T8 Right upper buttock   Shoulder external rotation 43*  34  Elbow flexion     Elbow extension WFL *    (Blank rows = not tested)  UPPER EXTREMITY MMT:  MMT Right eval Left eval  Shoulder flexion 2+   Shoulder extension 3+   Shoulder abduction 2+   Shoulder adduction    Shoulder internal rotation 4*   Shoulder external rotation 4-*   Middle trapezius    Lower trapezius    Elbow flexion 4   Elbow extension    Grip strength (lbs)    (Blank rows = not tested)   PALPATION:  Palpation: TTP at R UT, parascapular muscles, biceps. Increased tissue tension in R UT                                                                                                                               TREATMENT DATE:  10/29/2024: UBE 5 min (PT present to discuss status/progress) Wall ladder 5x Seated landmine press red power cord 2x10  Shoulder internal rotation at the counter 2x10  Red ball roll up wall 2x5 hold 5 sec at the top Standing counter force pulley with 3# weight 3 ways 10x each: flexion, scaption, abduction Isometric external rotation red band with lateral stepping 10x (pt  has difficulty following cues to avoid moving torso and shoulder)  Railing slides 10x Seated cane flexion 10x  Supine on incline: flexion 10x, holding 1# weight clocks 12:00/6:00 10x and 9:00/3:00 10x Pt declines the need for ice  10/24/2024: UBE not available, did Nustep x 5 min level 1 (PT present to discuss status/progress) 3 way scapular stabilization: patient continues to need heavy verbal, visual and tactile cues for slowing down and correct technique 4 D ball rolls: patient, again, needed heavy cues but did complete all directions  Seated shoulder extension and rows 2  x 10 with yellow band : decreased verbal, visual and tactile cues needed today Fwd bent shoulder exercises.  Did these leaning on counter today and she was able to complete the following: Fwd bent shoulder extension with heavy verbal, visual and tactile cues 2 x 10 right with 1# Fwd bent shoulder rows with heavy verbal, visual and tactile cues 2 x 10 right with 1# Fwd bent shoulder horizontal abduction with heavy verbal, visual and tactile cues 2 x 10 right with 1#  Side lying shoulder ER with 0# 2 x 10 with heavy verbal, visual and tactile cues 2 x 10 right with 1# Supine serratus punch 2 x 10 with 0# with heavy verbal, visual and tactile cues 2 x 10 right with 1# Supine shoulder alphabet A-Z patient needed repeated heavy verbal, visual and tactile cues 2 x 10 right with 1# Supine shoulder flexion bilaterally 2 x 10 (patient did well with this one- very few verbal cues needed) with 1# Ice to right shoulder x 10 min at end of session   10/22/2024: UBE x 6 min 3/3 (PT present to discuss status/progress) 3 way scapular stabilization: patient continues to need heavy verbal, visual and tactile cues for slowing down and correct technique 4 D ball rolls: patient, again, needed heavy cues but did complete all directions  Green loop bilateral arm chops with elbows fixed at side: heavy verbal, visual and tactile cues needed: had to restart several times Shoulder extension 2 x 10 with yellow band : decreased verbal, visual and tactile cues needed today Attempted to use green machine for interpreter as patient does not seem to understand instructions:  interpreter came on but patient is quite impulsive and talked over PT and interpreter.  PT had to repeat instructions to slow down several times and then we lost connection with interpreter.  Patient states she does not need interpreter.  I know 3 languages  but explained to her that she may have some trouble with medical language because she was not  understanding the instructions and was not doing the exercises correctly.  Took a few minutes to ask patient to listen and watch PT carefully instead of just swinging her arm around.  Explained that we need to isolate certain muscles because other muscles are overriding the ones that need to get stronger.  Explained that if the exercises are not done properly, her shoulder will not likely get better.   Fwd bent shoulder exercises as patient was unable to lie prone.  Did these leaning on counter today and she was able to complete the following: Fwd bent shoulder extension with heavy verbal, visual and tactile cues 2 x 10 right Fwd bent shoulder rows with heavy verbal, visual and tactile cues 2 x 10 right Fwd bent shoulder horizontal abduction with heavy verbal, visual and tactile cues 2 x 10 right Side lying shoulder ER with 0# 2 x 10 with heavy verbal, visual  and tactile cues 2 x 10 right Supine serratus punch 2 x 10 with 0# with heavy verbal, visual and tactile cues 2 x 10 right Supine shoulder alphabet A-Z patient needed repeated heavy verbal, visual and tactile cues 2 x 10 right Supine shoulder flexion bilaterally 2 x 10 (patient did well with this one- very few verbal cues needed)    PATIENT EDUCATION: Education details: PT eval findings, anticipated POC, initial HEP, and discussed with patient not to push her exercises into pain.   Person educated: Patient Education method: Explanation, Demonstration, Tactile cues, Verbal cues, and Handouts Education comprehension: verbalized understanding and returned demonstration  HOME EXERCISE PROGRAM: Access Code: [email or web address] URL: [email or web address] Date: 09/30/2024 Prepared by: Mliss  Exercises - Standing Scapular Retraction  - 3 x daily - 7 x weekly - 1 sets - 10 reps - 10 hold - Standing Scapular Depression  - 3 x daily - 7 x weekly - 1 sets - 10 reps - Shoulder External Rotation and Scapular Retraction  - 3 x daily - 7 x weekly  - 2 sets - 10 reps -   hold - Standing Shoulder Extension with Dowel  - 3 x daily - 7 x weekly - 2 sets - 10 reps - Supine Shoulder Flexion Extension AAROM with Dowel  - 3 x daily - 7 x weekly - 2 sets - 10 reps  ASSESSMENT:  CLINICAL IMPRESSION:  The patient has decreased motor control for overhead elevation but with assisted ROM she is able to place the arm overhead and hold it there for a period of time.  With gravity assisted ROM (supine with head elevated) she is able to elevate her arm without assistance.  She demonstrates good compliance with her lying down ex's (done every morning). Therapist providing verbal cues to optimize technique with  exercises in order to achieve the greatest benefit.  At the end of session, she reports she will call back to schedule (she will be out of town.)   OBJECTIVE IMPAIRMENTS: decreased activity tolerance, decreased ROM, decreased strength, increased muscle spasms, impaired flexibility, impaired UE functional use, postural dysfunction, and pain.   ACTIVITY LIMITATIONS: carrying, lifting, sleeping, bathing, toileting, dressing, reach over head, hygiene/grooming, and caring for others  PARTICIPATION LIMITATIONS: meal prep, cleaning, laundry, driving, shopping, community activity, and church  PERSONAL FACTORS: Age, Behavior pattern, Fitness, Time since onset of injury/illness/exacerbation, and 3+ comorbidities: Afib, HTN CHF are also affecting patient's functional outcome.   REHAB POTENTIAL: Excellent  CLINICAL DECISION MAKING: Evolving/moderate complexity  EVALUATION COMPLEXITY: Moderate   GOALS: Goals reviewed with patient? Yes  SHORT TERM GOALS: Target date: 10/28/24  Pain report to be no greater than 4/10  Baseline: Goal status: met 12/23  2.  Patient will be independent with initial HEP  Baseline:  Goal status: met 12/23   LONG TERM GOALS: Target date: 11/25/24  Patient to report pain no greater than 2/10 with ADLS Baseline:  Goal  status: INITIAL  2.  Patient to be independent with advanced HEP  Baseline:  Goal status: INITIAL  3.  Patient to be able to don/doff her jacket without difficulty  Baseline:  Goal status: INITIAL  4.  Patient will be able to reach overhead into cabinets and on top of shelves without pain  Baseline:  Goal status: INITIAL  5.  Patient to be able to do all ADL's and IADL's independently with 4 to 4+/5 strength in the shoulder. Baseline:  Goal status: INITIAL  6.  Quick Dash score to  improve by 4-6 points Baseline: 54.5 / 100 = 54.5 % Goal status: INITIAL   PLAN:  PT FREQUENCY: 2x/week  PT DURATION: 8 weeks  PLANNED INTERVENTIONS: 97110-Therapeutic exercises, 97530- Therapeutic activity, 97112- Neuromuscular re-education, 97535- Self Care, 02859- Manual therapy, [phone]- Aquatic Therapy, [phone]- Electrical stimulation (unattended), 97016- Vasopneumatic device, [id number]- Ionotophoresis 4mg /ml Dexamethasone , 79439 (1-2 muscles), 20561 (3+ muscles)- Dry Needling, Patient/Family education, Balance training, Taping, Joint mobilization, Spinal mobilization, Scar mobilization, Cryotherapy, and Moist heat  PLAN FOR NEXT SESSION:  Patient states she will return Feb 3; We will continue to focus on RC strengthening, scapular stabilization and ROM working on proper technique.    Glade Pesa, PT 10/29/2024 5:34 PM Phone: [phone] Fax: [phone]  [facility] [address] [place], [place] [address] Phone # [phone] Fax [phone]   "

## 2024-11-05 ENCOUNTER — Ambulatory Visit

## 2024-11-12 ENCOUNTER — Ambulatory Visit

## 2024-11-14 ENCOUNTER — Ambulatory Visit

## 2024-11-19 ENCOUNTER — Ambulatory Visit: Admitting: Rehabilitative and Restorative Service Providers"

## 2024-11-21 ENCOUNTER — Ambulatory Visit

## 2024-11-26 ENCOUNTER — Ambulatory Visit

## 2024-11-28 ENCOUNTER — Ambulatory Visit
